# Patient Record
Sex: Female | Born: 1960 | Race: Black or African American | Hispanic: No | Marital: Single | Smoking: Current every day smoker
Health system: Southern US, Community
[De-identification: ages and names within clinical notes are randomized; demographics above are authoritative.]

## PROBLEM LIST (undated history)

## (undated) DIAGNOSIS — I10 Essential (primary) hypertension: Secondary | ICD-10-CM

## (undated) DIAGNOSIS — R351 Nocturia: Secondary | ICD-10-CM

## (undated) DIAGNOSIS — G5600 Carpal tunnel syndrome, unspecified upper limb: Secondary | ICD-10-CM

## (undated) DIAGNOSIS — Z8669 Personal history of other diseases of the nervous system and sense organs: Secondary | ICD-10-CM

## (undated) DIAGNOSIS — M549 Dorsalgia, unspecified: Secondary | ICD-10-CM

## (undated) DIAGNOSIS — G8929 Other chronic pain: Secondary | ICD-10-CM

## (undated) DIAGNOSIS — R531 Weakness: Secondary | ICD-10-CM

## (undated) DIAGNOSIS — K5900 Constipation, unspecified: Secondary | ICD-10-CM

## (undated) DIAGNOSIS — K8020 Calculus of gallbladder without cholecystitis without obstruction: Secondary | ICD-10-CM

## (undated) HISTORY — PX: BACK SURGERY: SHX140

## (undated) HISTORY — PX: TUBAL LIGATION: SHX77

## (undated) HISTORY — PX: TONSILLECTOMY AND ADENOIDECTOMY: SUR1326

## (undated) HISTORY — PX: CERVICAL SPINE SURGERY: SHX589

## (undated) HISTORY — PX: MYRINGOTOMY: SUR874

## (undated) HISTORY — PX: COLONOSCOPY: SHX174

## (undated) HISTORY — PX: OTHER SURGICAL HISTORY: SHX169

---

## 1998-11-22 ENCOUNTER — Ambulatory Visit (HOSPITAL_COMMUNITY): Admission: RE | Admit: 1998-11-22 | Discharge: 1998-11-22 | Payer: Self-pay | Admitting: Neurosurgery

## 1998-11-23 ENCOUNTER — Encounter: Payer: Self-pay | Admitting: Neurosurgery

## 1998-11-23 ENCOUNTER — Ambulatory Visit (HOSPITAL_COMMUNITY): Admission: RE | Admit: 1998-11-23 | Discharge: 1998-11-23 | Payer: Self-pay | Admitting: Neurosurgery

## 1998-11-30 ENCOUNTER — Encounter: Payer: Self-pay | Admitting: Neurosurgery

## 1998-12-01 ENCOUNTER — Encounter: Payer: Self-pay | Admitting: Neurosurgery

## 1998-12-01 ENCOUNTER — Inpatient Hospital Stay (HOSPITAL_COMMUNITY): Admission: RE | Admit: 1998-12-01 | Discharge: 1998-12-02 | Payer: Self-pay | Admitting: Neurosurgery

## 1999-05-14 ENCOUNTER — Emergency Department (HOSPITAL_COMMUNITY): Admission: EM | Admit: 1999-05-14 | Discharge: 1999-05-15 | Payer: Self-pay | Admitting: Emergency Medicine

## 1999-05-25 ENCOUNTER — Other Ambulatory Visit: Admission: RE | Admit: 1999-05-25 | Discharge: 1999-05-25 | Payer: Self-pay | Admitting: Obstetrics & Gynecology

## 1999-05-25 ENCOUNTER — Encounter: Admission: RE | Admit: 1999-05-25 | Discharge: 1999-05-25 | Payer: Self-pay | Admitting: Obstetrics & Gynecology

## 1999-06-08 ENCOUNTER — Encounter: Admission: RE | Admit: 1999-06-08 | Discharge: 1999-06-08 | Payer: Self-pay | Admitting: Obstetrics & Gynecology

## 2000-03-28 ENCOUNTER — Ambulatory Visit (HOSPITAL_COMMUNITY): Admission: RE | Admit: 2000-03-28 | Discharge: 2000-03-28 | Payer: Self-pay | Admitting: Neurosurgery

## 2000-03-28 ENCOUNTER — Encounter: Payer: Self-pay | Admitting: Neurosurgery

## 2000-10-19 ENCOUNTER — Inpatient Hospital Stay (HOSPITAL_COMMUNITY): Admission: AD | Admit: 2000-10-19 | Discharge: 2000-10-21 | Payer: Self-pay | Admitting: Obstetrics and Gynecology

## 2000-10-19 ENCOUNTER — Encounter: Payer: Self-pay | Admitting: Emergency Medicine

## 2000-10-28 ENCOUNTER — Encounter: Admission: RE | Admit: 2000-10-28 | Discharge: 2000-10-28 | Payer: Self-pay | Admitting: Obstetrics & Gynecology

## 2000-12-30 ENCOUNTER — Ambulatory Visit (HOSPITAL_COMMUNITY): Admission: RE | Admit: 2000-12-30 | Discharge: 2000-12-30 | Payer: Self-pay | Admitting: Neurosurgery

## 2000-12-30 ENCOUNTER — Encounter: Payer: Self-pay | Admitting: Neurosurgery

## 2001-01-16 ENCOUNTER — Encounter: Payer: Self-pay | Admitting: Neurosurgery

## 2001-01-20 ENCOUNTER — Encounter: Payer: Self-pay | Admitting: Neurosurgery

## 2001-01-20 ENCOUNTER — Inpatient Hospital Stay (HOSPITAL_COMMUNITY): Admission: RE | Admit: 2001-01-20 | Discharge: 2001-01-21 | Payer: Self-pay | Admitting: Neurosurgery

## 2004-07-10 ENCOUNTER — Emergency Department (HOSPITAL_COMMUNITY): Admission: EM | Admit: 2004-07-10 | Discharge: 2004-07-10 | Payer: Self-pay | Admitting: Emergency Medicine

## 2004-07-20 ENCOUNTER — Encounter (INDEPENDENT_AMBULATORY_CARE_PROVIDER_SITE_OTHER): Payer: Self-pay | Admitting: *Deleted

## 2004-07-20 ENCOUNTER — Other Ambulatory Visit: Admission: RE | Admit: 2004-07-20 | Discharge: 2004-07-20 | Payer: Self-pay | Admitting: Obstetrics and Gynecology

## 2004-07-20 ENCOUNTER — Encounter: Admission: RE | Admit: 2004-07-20 | Discharge: 2004-07-20 | Payer: Self-pay | Admitting: Family Medicine

## 2004-07-26 ENCOUNTER — Emergency Department (HOSPITAL_COMMUNITY): Admission: EM | Admit: 2004-07-26 | Discharge: 2004-07-26 | Payer: Self-pay | Admitting: Family Medicine

## 2004-08-08 ENCOUNTER — Emergency Department (HOSPITAL_COMMUNITY): Admission: EM | Admit: 2004-08-08 | Discharge: 2004-08-08 | Payer: Self-pay | Admitting: Family Medicine

## 2004-08-14 ENCOUNTER — Ambulatory Visit: Payer: Self-pay | Admitting: Obstetrics and Gynecology

## 2004-08-29 ENCOUNTER — Ambulatory Visit (HOSPITAL_COMMUNITY): Admission: RE | Admit: 2004-08-29 | Discharge: 2004-08-29 | Payer: Self-pay | Admitting: Obstetrics and Gynecology

## 2004-08-29 ENCOUNTER — Ambulatory Visit: Payer: Self-pay | Admitting: Obstetrics and Gynecology

## 2004-09-20 ENCOUNTER — Ambulatory Visit: Payer: Self-pay | Admitting: Obstetrics and Gynecology

## 2005-03-16 ENCOUNTER — Emergency Department (HOSPITAL_COMMUNITY): Admission: EM | Admit: 2005-03-16 | Discharge: 2005-03-16 | Payer: Self-pay | Admitting: Emergency Medicine

## 2006-08-20 ENCOUNTER — Encounter: Admission: RE | Admit: 2006-08-20 | Discharge: 2006-08-20 | Payer: Self-pay | Admitting: Neurosurgery

## 2006-12-03 ENCOUNTER — Emergency Department (HOSPITAL_COMMUNITY): Admission: EM | Admit: 2006-12-03 | Discharge: 2006-12-03 | Payer: Self-pay | Admitting: Emergency Medicine

## 2008-05-12 ENCOUNTER — Encounter: Admission: RE | Admit: 2008-05-12 | Discharge: 2008-05-12 | Payer: Self-pay | Admitting: Neurosurgery

## 2008-07-13 ENCOUNTER — Encounter: Admission: RE | Admit: 2008-07-13 | Discharge: 2008-09-02 | Payer: Self-pay | Admitting: Neurosurgery

## 2009-05-30 ENCOUNTER — Emergency Department (HOSPITAL_COMMUNITY): Admission: EM | Admit: 2009-05-30 | Discharge: 2009-05-30 | Payer: Self-pay | Admitting: Family Medicine

## 2009-08-10 ENCOUNTER — Emergency Department (HOSPITAL_COMMUNITY): Admission: EM | Admit: 2009-08-10 | Discharge: 2009-08-10 | Payer: Self-pay | Admitting: Emergency Medicine

## 2009-11-10 ENCOUNTER — Inpatient Hospital Stay (HOSPITAL_COMMUNITY): Admission: RE | Admit: 2009-11-10 | Discharge: 2009-11-12 | Payer: Self-pay | Admitting: Neurosurgery

## 2010-09-06 ENCOUNTER — Encounter: Admission: RE | Admit: 2010-09-06 | Discharge: 2010-09-06 | Payer: Self-pay | Admitting: Neurosurgery

## 2011-02-25 LAB — PROTIME-INR: INR: 0.99 (ref 0.00–1.49)

## 2011-02-26 LAB — DIFFERENTIAL
Basophils Absolute: 0 10*3/uL (ref 0.0–0.1)
Basophils Relative: 0 % (ref 0–1)
Eosinophils Absolute: 0.1 10*3/uL (ref 0.0–0.7)
Eosinophils Relative: 2 % (ref 0–5)
Monocytes Absolute: 0.5 10*3/uL (ref 0.1–1.0)
Monocytes Relative: 8 % (ref 3–12)

## 2011-02-26 LAB — URINALYSIS, ROUTINE W REFLEX MICROSCOPIC
Bilirubin Urine: NEGATIVE
Glucose, UA: NEGATIVE mg/dL
Ketones, ur: NEGATIVE mg/dL
Nitrite: NEGATIVE
Specific Gravity, Urine: 1.028 (ref 1.005–1.030)
pH: 5 (ref 5.0–8.0)

## 2011-02-26 LAB — COMPREHENSIVE METABOLIC PANEL
AST: 19 U/L (ref 0–37)
Albumin: 3.8 g/dL (ref 3.5–5.2)
Alkaline Phosphatase: 58 U/L (ref 39–117)
CO2: 30 mEq/L (ref 19–32)
Chloride: 105 mEq/L (ref 96–112)
Creatinine, Ser: 1.01 mg/dL (ref 0.4–1.2)
GFR calc non Af Amer: 58 mL/min — ABNORMAL LOW (ref >60)
Potassium: 4 mEq/L (ref 3.5–5.1)
Total Bilirubin: 0.5 mg/dL (ref 0.3–1.2)

## 2011-02-26 LAB — CBC
HCT: 42.2 % (ref 36.0–46.0)
Hemoglobin: 14.1 g/dL (ref 12.0–15.0)
Platelets: 219 10*3/uL (ref 150–400)
RDW: 14.9 % (ref 11.5–15.5)
WBC: 5.5 10*3/uL (ref 4.0–10.5)

## 2011-02-26 LAB — URINE MICROSCOPIC-ADD ON

## 2011-02-26 LAB — TYPE AND SCREEN: Antibody Screen: NEGATIVE

## 2011-02-26 LAB — ABO/RH: ABO/RH(D): A POS

## 2011-04-12 NOTE — Op Note (Signed)
[place]  Patient:    Deborah Mcmahon, Deborah Mcmahon                      MRN: [id number] Proc. Date: 01/20/01 Adm. Date:  [id number] Attending:  Emeterio Reeve                           Operative Report  PREOPERATIVE DIAGNOSIS:  Herniated disk C6-7.  POSTOPERATIVE DIAGNOSIS:  Herniated disk C6-7.  OPERATION PERFORMED:  Anterior cervical diskectomy and fusion C6-7.  SURGEON:  Payton Doughty, M.D.  ANESTHESIA:  General endotracheal.  PREP:  Sterile Betadine prep and scrub with alcohol wipe.  COMPLICATIONS:  None.  ASSISTANT:  Hewitt Shorts, M.D.  INDICATIONS FOR PROCEDURE:  The patient is a 50 year old right-handed, black female with a herniated disk at C6-C7.  DESCRIPTION OF PROCEDURE:  The patient was taken to the operating room, smoothly anesthetized and intubated, and placed supine on the operating table. Following shave, prep and drape in the usual sterile fashion, the skin was incised from the midline to the medial border of the sternocleidomastoid muscle at a fingerbreadth below the carotid tubercle.  The platysma was identified, elevated and divided and undermined.  The sternocleidomastoid was identified.  Medial dissection revealed a carotid artery which retracted laterally to the left, trachea and esophagus were retracted laterally to the right, exposing the bones of the anterior cervical spine.  Intraoperative x-ray was obtained with a marker in place to confirm correctness of level. Having confirmed correctness of the level, diskectomy was carried out at C6 and C7 first under gross observation, then using the operating microscope. Under the operating microscope it was observed that there was a herniated disk that had gone through the posterior longitudinal ligament and was larger to the left side.  This was removed without difficulty.  At C7 both neural foramina were explored.  The C7, the one on the left had some disk material within  it that was removed with a hook.  On the right side, the neural foramen was relatively clear.  There had been, however, significant compression on the cord posteriorly.  Following complete diskectomy, a 7 mm bone graft was fashioned from patellar allograft and tapped into place.  The wound was irrigated and hemostasis assured and a 14 mm tether plate was placed with 12 mm screws, two in C6 and two in C7.  Intraoperative x-ray showed good placement of bone graft, plate and screws.  The platysma was reapproximated with 3-0 Vicryl in interrupted fashion.  Subcutaneous tissues reapproximated with 3-0 Vicryl in interrupted fashion.  Skin was closed with 4-0 Vicryl in running subcuticular fashion.  Benzoin and Steri-Strips were placed and made occlusive with Telfa and Op-Site.  The patient was then placed in an Aspen collar and returned to the recovery room in good condition. DD:  01/20/01 TD:  01/20/01 Job: [id number]

## 2011-04-12 NOTE — Discharge Summary (Signed)
[facility]  Patient:    Deborah Mcmahon, Deborah Mcmahon                      MRN: [id number] Adm. Date:  [id number] Disc. Date: [id number] Attending:  Antionette Char Dictator:   Maryelizabeth Rowan, M.D.                           Discharge Summary  DATE OF BIRTH:                07/07/2061  PRIMARY DIAGNOSIS:            Pelvic inflammatory disease.  SECONDARY DIAGNOSES:          1. Uterine fibroids.                               2. Anemia.  HISTORY OF PRESENT ILLNESS:   This 50 year old, African-American, female patient of Roseanna Rainbow, M.D., presented to the [facility] complaining of abdominal pain.  The patient was diagnosed with gonorrhea approximately six months ago and treated at that time.  HOSPITAL COURSE:              The patient was admitted and treated with IV gentamicin and clindamycin.  The patient received antibiotics until she was pain-free.  The patient became pain-free the evening before discharge.  SIGNIFICANT LABORATORIES:     Drug screen positive for cocaine.  HIV nonreactive.  Blood cultures negative.  Hepatitis B surface antigen negative. The iron level was less than 10, ferritin 37, and TIBC could not be calculated because of significant decrease in iron.  CONSULTANTS:                  None.  PROCEDURES:                   None.  DISCHARGE CONDITION:          The patient was discharged home in stable condition with follow-up with Roseanna Rainbow, M.D., on Tuesday, October 28, 2000, at 2:15 p.m.  MEDICATIONS AT DISCHARGE:     1. Doxycycline 100 mg b.i.d.                               2. Iron 325 mg three times a day. DD:  10/21/00 TD:  10/21/00 Job: [id number]

## 2011-04-12 NOTE — Group Therapy Note (Signed)
NAME:  Deborah Mcmahon, Deborah Mcmahon                         ACCOUNT NO.:  [id number]   MEDICAL RECORD NO.:  [id number]                   PATIENT TYPE:  OUT   LOCATION:  [facility]                           FACILITY:  [facility]   PHYSICIAN:  Argentina Donovan, MD                     DATE OF BIRTH:  06/28/1961   DATE OF SERVICE:  07/20/2004                                    CLINIC NOTE   REASON FOR VISIT:  The patient is a 50 year old nulligravida black female  who has had a long history of menometrorrhagia which has gotten worse in the  last 6 months.  She is a smoker so cannot use oral contraceptives, and an  ultrasound done 2 years ago showed a small leiomyomata on the outside of the  uterus but a normal endometrial cavity.  The patient had a history of  cocaine many years ago but she has not been taking any of that for the last  3 years and is otherwise in fairly good health.  She has some eczema on her  lower extremities, but other than that no health problems and is not taking  any iron supplement.   PHYSICAL EXAMINATION:  The external genitalia is normal, BUS within normal  limits.  The vagina is clean and well rugated.  The cervix is clean,  nulliparous, and the uterus is anterior, normal size, shape, consistency,  with normal adnexa.  Endometrial biopsy and Pap smear were taken.   Discussed possible options for this patient and she seems to lean towards  the NovaSure endometrial ablation.  We have told her that she seems to be a  good candidate with a normal-size uterus and that the periods may be lighter  or nonexistent afterwards.  However, there is probably a 25% chance that  there will be no change in her flow.  We have talked to her about the  possible complications of perforation and infection and anesthetic  complications.  The patient is being placed on iron therapy, a CBC is being  drawn, and I am going to renew her topical cortisone cream for her eczema.   IMPRESSION:  1. Intractable  menometrorrhagia.  2. Small leiomyomata uteri.                                               Argentina Donovan, MD    PR/MEDQ  D:  07/20/2004  T:  07/21/2004  Job:  [id number]

## 2011-04-12 NOTE — Op Note (Signed)
Deborah Mcmahon, Deborah Mcmahon               ACCOUNT NO.:  [id number]   MEDICAL RECORD NO.:  [id number]          PATIENT TYPE:  AMB   LOCATION:  [facility]                           FACILITY:  [facility]   PHYSICIAN:  Phil D. Okey Dupre, M.D.     DATE OF BIRTH:  1961/07/21   DATE OF PROCEDURE:  08/29/2004  DATE OF DISCHARGE:                                 OPERATIVE REPORT   PREOPERATIVE DIAGNOSIS:  Intractable menometrorrhagia with anemia.   POSTOPERATIVE DIAGNOSIS:  Intractable menometrorrhagia with anemia.   PROCEDURE:  Endometrial ablation with the Novasure method.   SURGEON:  Javier Glazier. Okey Dupre, M.D.   ANESTHESIA:  General.   ESTIMATED BLOOD LOSS:  30 mL.   The procedure went as follows:  Under satisfactory general anesthesia,  patient in dorsal lithotomy position, the perineum and vagina were prepped  and draped in the usual sterile manner.  Bimanual pelvic examination under  anesthesia revealed a uterus upper limits of normal size, regular in  configuration, and freely movable.  A weighted speculum was placed in the  posterior fourchette of the vagina, the anterior lip of the cervix grasped  with a single-tooth tenaculum.  The uterine cavity sounded to a depth of 10  cm.  The endocervical os dilated to a #8 Hegar dilator.  The endocervical  measurement was 4 cm.  The Novasure applicator was adjusted appropriately to  an endometrial cavity of 6 cm.  It inserted and manipulated in the usual  fashion to good seating and a width of 4.5.  Appropriate pressure was  applied, and the ablation took 1 minute 10 seconds.  The instrument was then  removed in the usual fashion.  A ring forceps was placed on the anterior lip  of the cervix, where there was a small bleeder, bleeding caused by the  tenaculum, which controlled the bleeding quite easily, and the patient was  transferred to the recovery room in satisfactory condition, having tolerated  the procedure well.      PDR/MEDQ  D:  08/29/2004  T:  08/29/2004   Job:  [id number]

## 2011-04-12 NOTE — H&P (Signed)
[facility]  Patient:    Deborah Mcmahon, Deborah Mcmahon                        MRN: [id number] Adm. Date:  01/20/01 Attending:  Payton Doughty, M.D.                         History and Physical  ADMISSION DIAGNOSES:  Herniated disks at C6-7.  HISTORY OF PRESENT ILLNESS:  This is a 50 year old right handed black female who has had a 3-4 disk in 1994, 5-6 disk done in 1996, and a 4-5 disk done in 2000.  She now presents with increasing neck pain and MRI that shows large herniated disk at C6-7 with cord compression.  PAST MEDICAL HISTORY:  Remarkable for a tubal cyst removed as an 50 year old. No other medical problems.  MEDICATIONS:  None.  ALLERGIES:  No known drug allergies.  SOCIAL HISTORY:  She smokes 1/2 pack of cigarettes a day.  She does not drink alcohol.  She is currently off work.  FAMILY HISTORY:  Unknown and as far as we know noncontributory.  REVIEW OF SYSTEMS:  Remarkable for neck pain and shoulder pain.  PHYSICAL EXAMINATION:  HEENT:  Within normal limits.  She has good range of motion of her neck.  CHEST:  Clear.  HEART:  Unremarkable.  ABDOMEN:  Nontender with no hepatosplenomegaly.  EXTREMITIES:  Without clubbing or cyanosis.  GENITOURINARY:  Deferred.  Peripheral pulses are good.  NEUROLOGICAL:  She is awake, alert, and oriented.  Cranial nerves are intact. Motor examination shows 5/5 strength in the upper extremities save for some pain limited weakness in triceps on the right side.  There is no current sensory deficit.  Reflexes are intact.  She does have a positive Hoffmans. Lower extremity reflexes are nonmyopathic.  She does have a positive Lhermittes with neck extension.  MRI shows large herniated disk at C6-7 with cord compression.  PLAN:  Anterior cervical diskectomy and fusion.  The risks and benefits of this approach have been discussed with her and she wishes to proceed. DD:  01/20/01 TD:  01/20/01 Job:  [id number]

## 2012-02-07 ENCOUNTER — Ambulatory Visit: Payer: Self-pay | Admitting: Advanced Practice Midwife

## 2012-04-13 ENCOUNTER — Encounter (HOSPITAL_COMMUNITY): Payer: Self-pay | Admitting: Family Medicine

## 2012-04-13 ENCOUNTER — Emergency Department (HOSPITAL_COMMUNITY): Payer: Medicare Other

## 2012-04-13 ENCOUNTER — Emergency Department (HOSPITAL_COMMUNITY)
Admission: EM | Admit: 2012-04-13 | Discharge: 2012-04-13 | Disposition: A | Discharge status: Home / Self Care | Payer: Medicare Other | Attending: Emergency Medicine | Admitting: Emergency Medicine

## 2012-04-13 DIAGNOSIS — R1011 Right upper quadrant pain: Secondary | ICD-10-CM | POA: Insufficient documentation

## 2012-04-13 DIAGNOSIS — R61 Generalized hyperhidrosis: Secondary | ICD-10-CM | POA: Insufficient documentation

## 2012-04-13 DIAGNOSIS — R0602 Shortness of breath: Secondary | ICD-10-CM | POA: Insufficient documentation

## 2012-04-13 DIAGNOSIS — K8020 Calculus of gallbladder without cholecystitis without obstruction: Secondary | ICD-10-CM | POA: Insufficient documentation

## 2012-04-13 DIAGNOSIS — R112 Nausea with vomiting, unspecified: Secondary | ICD-10-CM | POA: Insufficient documentation

## 2012-04-13 DIAGNOSIS — K8050 Calculus of bile duct without cholangitis or cholecystitis without obstruction: Secondary | ICD-10-CM

## 2012-04-13 LAB — CBC
HCT: 41.8 % (ref 36.0–46.0)
MCHC: 34.7 g/dL (ref 30.0–36.0)
Platelets: 241 10*3/uL (ref 150–400)
RDW: 14.5 % (ref 11.5–15.5)

## 2012-04-13 LAB — COMPREHENSIVE METABOLIC PANEL
AST: 24 U/L (ref 0–37)
Albumin: 4.3 g/dL (ref 3.5–5.2)
Alkaline Phosphatase: 63 U/L (ref 39–117)
BUN: 18 mg/dL (ref 6–23)
Potassium: 3.9 mEq/L (ref 3.5–5.1)
Total Protein: 8.2 g/dL (ref 6.0–8.3)

## 2012-04-13 LAB — LIPASE, BLOOD: Lipase: 25 U/L (ref 11–59)

## 2012-04-13 LAB — TROPONIN I: Troponin I: <0.3 ng/mL (ref <0.30)

## 2012-04-13 MED ORDER — ONDANSETRON HCL 4 MG/2ML IJ SOLN
INTRAMUSCULAR | Status: AC
  Filled 2012-04-13: qty 2

## 2012-04-13 MED ORDER — MORPHINE SULFATE 4 MG/ML IJ SOLN
4.0000 mg | Freq: Once | INTRAMUSCULAR | Status: AC
  Administered 2012-04-13: 4 mg via INTRAVENOUS
  Filled 2012-04-13: qty 1

## 2012-04-13 MED ORDER — ONDANSETRON HCL 4 MG/2ML IJ SOLN
4.0000 mg | Freq: Once | INTRAMUSCULAR | Status: AC
  Administered 2012-04-13: 4 mg via INTRAVENOUS

## 2012-04-13 MED ORDER — ONDANSETRON 8 MG PO TBDP: 8.0000 mg | ORAL_TABLET | Freq: Three times a day (TID) | ORAL | Status: AC | PRN

## 2012-04-13 MED ORDER — SODIUM CHLORIDE 0.9 % IV SOLN
Freq: Once | INTRAVENOUS | Status: AC
  Administered 2012-04-13: 14:00:00 via INTRAVENOUS

## 2012-04-13 MED ORDER — HYDROCODONE-ACETAMINOPHEN 5-325 MG PO TABS: 1.0000 | ORAL_TABLET | ORAL | Status: AC | PRN

## 2012-04-13 NOTE — ED Notes (Signed)
US at bedside

## 2012-04-13 NOTE — ED Provider Notes (Signed)
History     CSN: [id number]  Arrival date & time 04/13/12  1356   First MD Initiated Contact with Patient 04/13/12 1407      Chief Complaint  Patient presents with  . Shortness of Breath  . Abdominal Pain  . Nausea  . Dizziness    (Consider location/radiation/quality/duration/timing/severity/associated sxs/prior treatment) The history is provided by the patient.   patient reports she felt normal earlier today and did some exercise.  She went home and ate lunch and then took a nap.  When she woke from her nap she was having epigastric and right upper quadrant discomfort.  She then reports she became extremely nauseated and she vomited several times.  Her vomit was described as nonbloody and non-bilious.  Denies coffee ground emesis.  She's had no diarrhea.  She denies melena or hematochezia.  She denies chest pain.  She does report with associated pain being severe she feels short of breath and sweaty.  She denies jaw pain shoulder pain and neck pain.  She has no prior history of coronary artery disease.  She reports she feels much better after vomiting at home.  Respiratory rate was initially very fast on arrival but after pain control she feels is her breathing is much better and is now controlled.  History reviewed. No pertinent past medical history.  History reviewed. No pertinent past surgical history.  History reviewed. No pertinent family history.  History  Substance Use Topics  . Smoking status: Not on file  . Smokeless tobacco: Not on file  . Alcohol Use: Not on file    OB History    Grav Para Term Preterm Abortions TAB SAB Ect Mult Living                  Review of Systems  Respiratory: Positive for shortness of breath.   Gastrointestinal: Positive for abdominal pain.  All other systems reviewed and are negative.    Allergies  Review of patient's allergies indicates no known allergies.  Home Medications   Current Outpatient Rx  Name Route Sig Dispense  Refill  . IBUPROFEN 200 MG PO TABS Oral Take 600 mg by mouth every 8 (eight) hours as needed. For pain.      BP 162/85  Pulse 64  Temp(Src) 98.7 F (37.1 C) (Oral)  Resp 18  SpO2 100%  Physical Exam  Nursing note and vitals reviewed. Constitutional: She is oriented to person, place, and time. She appears well-developed and well-nourished. No distress.  HENT:  Head: Normocephalic and atraumatic.  Eyes: EOM are normal.  Neck: Normal range of motion.  Cardiovascular: Normal rate, regular rhythm and normal heart sounds.   Pulmonary/Chest: Effort normal and breath sounds normal.  Abdominal: Soft. She exhibits no distension.       Mild right upper quadrant abdominal tenderness without guarding or rebound  Musculoskeletal: Normal range of motion.  Neurological: She is alert and oriented to person, place, and time.  Skin: Skin is warm and dry.  Psychiatric: She has a normal mood and affect. Judgment normal.    ED Course  Procedures (including critical care time)  Labs Reviewed  COMPREHENSIVE METABOLIC PANEL - Abnormal; Notable for the following:    Glucose, Bld 127 (*)    Creatinine, Ser 1.15 (*)    GFR calc non Af Amer 54 (*)    GFR calc Af Amer 62 (*)    All other components within normal limits  GLUCOSE, CAPILLARY - Abnormal; Notable for the following:  Glucose-Capillary 115 (*)    All other components within normal limits  CBC  LIPASE, BLOOD  TROPONIN I   US Abdomen Complete  04/13/2012  *RADIOLOGY REPORT*  Clinical Data:  Shortness of breath, abdominal pain  COMPLETE ABDOMINAL ULTRASOUND  Comparison:  None.  Findings:  Gallbladder:  Gallstones are noted within gallbladder.  The largest gallstone measures 1.3 cm.  There is a thickened edematous gallbladder wall without sonographic Murphy's sign.  Wall thickness measures 5 mm.  Clinical correlation is necessary to exclude early cholecystitis.  Common bile duct:  Measures 4.5 mm in diameter within normal limits.  Liver:  No  focal lesion identified.  Within normal limits in parenchymal echogenicity.  IVC:  Appears normal.  Pancreas:  No focal abnormality seen.  Spleen:  Measures 7.4 cm in length.  Normal echogenicity.  Right Kidney:  Measures 11.9 cm in length.  No mass, hydronephrosis or diagnostic renal calculus  Left Kidney:  Measures 11.4 cm in length.  No mass, hydronephrosis or diagnostic renal calculus  Abdominal aorta:  No aneurysm identified. There is up to 2.4 cm in diameter.  IMPRESSION: 1.  Gallstones are noted within gallbladder.  The largest gallstone measures 1.3 cm.  There is a thickened edematous gallbladder wall without sonographic Murphy's sign.  Wall thickness measures 5 mm. Clinical correlation is necessary to exclude early cholecystitis. 2.  Normal CBD. 3.  No hydronephrosis or diagnostic renal calculus.  Original Report Authenticated By: Natasha Mead, M.D.   Dg Chest Portable 1 View  04/13/2012  *RADIOLOGY REPORT*  Clinical Data: Shortness of breath.  PORTABLE CHEST - 1 VIEW  Comparison: 11/06/2009.  Findings: The cardiac silhouette, mediastinal and hilar contours are stable.  The lungs are clear.  No pleural effusion.  The bony thorax is intact.  IMPRESSION: No acute cardiopulmonary findings.  Original Report Authenticated By: P. Loralie Champagne, M.D.     1. Biliary colic       MDM  The patient has evidence of cholelithiasis.  At this time she no longer has right upper quadrant abdominal pain.  I don't believe this to be cholecystitis.  Discharge home in good condition.  She's been given general surgery number for followup for possible outpatient cholecystectomy.  Home with pain medicine and nausea medicine when necessary.  The patient understands to return to the ER for worsening abdominal pain or high fevers as this would suggest development of acute cholecystitis.  Clinically at this time she has not have it        Lyanne Co, MD 04/13/12 1710

## 2012-04-13 NOTE — ED Notes (Signed)
Pt reports having acute onset epigastric pain, sob, nausea, diaphoresis, and dizziness today about a "couple hours ago."

## 2012-04-13 NOTE — Discharge Instructions (Signed)
Biliary Colic  Biliary colic is a steady or irregular pain in the upper abdomen. It is usually under the right side of the rib cage. It happens when gallstones interfere with the normal flow of bile from the gallbladder. Bile is a liquid that helps to digest fats. Bile is made in the liver and stored in the gallbladder. When you eat a meal, bile passes from the gallbladder through the cystic duct and the common bile duct into the small intestine. There, it mixes with partially digested food. If a gallstone blocks either of these ducts, the normal flow of bile is blocked. The muscle cells in the bile duct contract forcefully to try to move the stone. This causes the pain of biliary colic.  SYMPTOMS   A person with biliary colic usually complains of pain in the upper abdomen. This pain can be:   In the center of the upper abdomen just below the breastbone.   In the upper-right part of the abdomen, near the gallbladder and liver.   Spread back toward the right shoulder blade.   Nausea and vomiting.   The pain usually occurs after eating.   Biliary colic is usually triggered by the digestive system's demand for bile. The demand for bile is high after fatty meals. Symptoms can also occur when a person who has been fasting suddenly eats a very large meal. Most episodes of biliary colic pass after 1 to 5 hours. After the most intense pain passes, your abdomen may continue to ache mildly for about 24 hours.  DIAGNOSIS  After you describe your symptoms, your caregiver will perform a physical exam. He or she will pay attention to the upper right portion of your belly (abdomen). This is the area of your liver and gallbladder. An ultrasound will help your caregiver look for gallstones. Specialized scans of the gallbladder may also be done. Blood tests may be done, especially if you have fever or if your pain persists. PREVENTION  Biliary colic can be prevented by controlling the risk factors for gallstones.  Some of these risk factors, such as heredity, increasing age, and pregnancy are a normal part of life. Obesity and a high-fat diet are risk factors you can change through a healthy lifestyle. Women going through menopause who take hormone replacement therapy (estrogen) are also more likely to develop biliary colic. TREATMENT   Pain medication may be prescribed.   You may be encouraged to eat a fat-free diet.   If the first episode of biliary colic is severe, or episodes of colic keep retuning, surgery to remove the gallbladder (cholecystectomy) is usually recommended. This procedure can be done through small incisions using an instrument called a laparoscope. The procedure often requires a brief stay in the hospital. Some people can leave the hospital the same day. It is the most widely used treatment in people troubled by painful gallstones. It is effective and safe, with no complications in more than 90% of cases.   If surgery cannot be done, medication that dissolves gallstones may be used. This medication is expensive and can take months or years to work. Only small stones will dissolve.   Rarely, medication to dissolve gallstones is combined with a procedure called shock-wave lithotripsy. This procedure uses carefully aimed shock waves to break up gallstones. In many people treated with this procedure, gallstones form again within a few years.  PROGNOSIS  If gallstones block your cystic duct or common bile duct, you are at risk for repeated episodes of   duct, you are at risk for repeated episodes of biliary colic. There is also a 25% chance that you will develop a gallbladder infection(acute cholecystitis), or some other complication of gallstones within 10 to 20 years. If you have surgery, schedule it at a time that is convenient for you and at a time when you are not sick.  HOME CARE INSTRUCTIONS    Drink plenty of clear fluids.   Avoid fatty, greasy or fried foods, or any foods that make your pain worse.   Take medications as directed.  SEEK MEDICAL  CARE IF:    You develop a fever over 100.5 F (38.1 C).   Your pain gets worse over time.   You develop nausea that prevents you from eating and drinking.   You develop vomiting.  SEEK IMMEDIATE MEDICAL CARE IF:    You have continuous or severe belly (abdominal) pain which is not relieved with medications.   You develop nausea and vomiting which is not relieved with medications.   You have symptoms of biliary colic and you suddenly develop a fever and shaking chills. This may signal cholecystitis. Call your caregiver immediately.   You develop a yellow color to your skin or the white part of your eyes (jaundice).  Document Released: 04/14/2006 Document Revised: 10/31/2011 Document Reviewed: 06/23/2008  [employer] Patient Information 2012 [employer]

## 2012-07-17 ENCOUNTER — Other Ambulatory Visit: Payer: Self-pay | Admitting: Neurosurgery

## 2012-07-17 DIAGNOSIS — M47812 Spondylosis without myelopathy or radiculopathy, cervical region: Secondary | ICD-10-CM

## 2012-07-21 ENCOUNTER — Ambulatory Visit
Admission: RE | Admit: 2012-07-21 | Discharge: 2012-07-21 | Disposition: A | Discharge status: Home / Self Care | Payer: Medicare Other | Source: Ambulatory Visit | Attending: Neurosurgery | Admitting: Neurosurgery

## 2012-07-21 DIAGNOSIS — M47812 Spondylosis without myelopathy or radiculopathy, cervical region: Secondary | ICD-10-CM

## 2012-08-11 ENCOUNTER — Other Ambulatory Visit: Payer: Self-pay | Admitting: Neurosurgery

## 2012-08-11 DIAGNOSIS — M47812 Spondylosis without myelopathy or radiculopathy, cervical region: Secondary | ICD-10-CM

## 2012-08-13 ENCOUNTER — Ambulatory Visit
Admission: RE | Admit: 2012-08-13 | Discharge: 2012-08-13 | Disposition: A | Discharge status: Home / Self Care | Payer: Medicare Other | Source: Ambulatory Visit | Attending: Neurosurgery | Admitting: Neurosurgery

## 2012-08-13 VITALS — BP 178/94 | HR 67

## 2012-08-13 DIAGNOSIS — M5126 Other intervertebral disc displacement, lumbar region: Secondary | ICD-10-CM

## 2012-08-13 DIAGNOSIS — M47812 Spondylosis without myelopathy or radiculopathy, cervical region: Secondary | ICD-10-CM

## 2012-08-13 DIAGNOSIS — M5125 Other intervertebral disc displacement, thoracolumbar region: Secondary | ICD-10-CM

## 2012-08-13 MED ORDER — IOHEXOL 300 MG/ML  SOLN
1.0000 mL | Freq: Once | INTRAMUSCULAR | Status: AC | PRN
  Administered 2012-08-13: 1 mL via EPIDURAL

## 2012-08-13 MED ORDER — TRIAMCINOLONE ACETONIDE 40 MG/ML IJ SUSP (RADIOLOGY)
60.0000 mg | Freq: Once | INTRAMUSCULAR | Status: AC
  Administered 2012-08-13: 60 mg via EPIDURAL

## 2012-09-29 ENCOUNTER — Other Ambulatory Visit: Payer: Self-pay | Admitting: Neurosurgery

## 2012-09-29 DIAGNOSIS — M47812 Spondylosis without myelopathy or radiculopathy, cervical region: Secondary | ICD-10-CM

## 2012-09-30 ENCOUNTER — Ambulatory Visit
Admission: RE | Admit: 2012-09-30 | Discharge: 2012-09-30 | Disposition: A | Discharge status: Home / Self Care | Payer: Medicare Other | Source: Ambulatory Visit | Attending: Neurosurgery | Admitting: Neurosurgery

## 2012-09-30 ENCOUNTER — Other Ambulatory Visit: Payer: Medicare Other

## 2012-09-30 VITALS — BP 169/92 | HR 74

## 2012-09-30 DIAGNOSIS — M47812 Spondylosis without myelopathy or radiculopathy, cervical region: Secondary | ICD-10-CM

## 2012-09-30 MED ORDER — IOHEXOL 300 MG/ML  SOLN
1.0000 mL | Freq: Once | INTRAMUSCULAR | Status: AC | PRN
  Administered 2012-09-30: 1 mL via EPIDURAL

## 2012-09-30 MED ORDER — TRIAMCINOLONE ACETONIDE 40 MG/ML IJ SUSP (RADIOLOGY)
60.0000 mg | Freq: Once | INTRAMUSCULAR | Status: AC
  Administered 2012-09-30: 60 mg via EPIDURAL

## 2013-05-12 ENCOUNTER — Other Ambulatory Visit: Payer: Self-pay | Admitting: Neurosurgery

## 2013-05-12 DIAGNOSIS — M47817 Spondylosis without myelopathy or radiculopathy, lumbosacral region: Secondary | ICD-10-CM

## 2013-05-14 ENCOUNTER — Ambulatory Visit
Admission: RE | Admit: 2013-05-14 | Discharge: 2013-05-14 | Disposition: A | Discharge status: Home / Self Care | Payer: Medicare Other | Source: Ambulatory Visit | Attending: Neurosurgery | Admitting: Neurosurgery

## 2013-05-14 DIAGNOSIS — M47817 Spondylosis without myelopathy or radiculopathy, lumbosacral region: Secondary | ICD-10-CM

## 2014-12-30 DIAGNOSIS — M4316 Spondylolisthesis, lumbar region: Secondary | ICD-10-CM | POA: Diagnosis not present

## 2014-12-30 DIAGNOSIS — Z6835 Body mass index (BMI) 35.0-35.9, adult: Secondary | ICD-10-CM | POA: Diagnosis not present

## 2014-12-30 DIAGNOSIS — S32009K Unspecified fracture of unspecified lumbar vertebra, subsequent encounter for fracture with nonunion: Secondary | ICD-10-CM | POA: Diagnosis not present

## 2014-12-30 DIAGNOSIS — M4806 Spinal stenosis, lumbar region: Secondary | ICD-10-CM | POA: Diagnosis not present

## 2014-12-30 DIAGNOSIS — M5416 Radiculopathy, lumbar region: Secondary | ICD-10-CM | POA: Diagnosis not present

## 2015-01-03 ENCOUNTER — Other Ambulatory Visit (HOSPITAL_COMMUNITY): Payer: Self-pay | Admitting: Neurosurgery

## 2015-01-17 ENCOUNTER — Other Ambulatory Visit: Payer: Self-pay

## 2015-01-17 ENCOUNTER — Encounter (HOSPITAL_COMMUNITY)
Admission: RE | Admit: 2015-01-17 | Discharge: 2015-01-17 | Disposition: A | Discharge status: Home / Self Care | Payer: Medicare Other | Source: Ambulatory Visit | Attending: Neurosurgery | Admitting: Neurosurgery

## 2015-01-17 ENCOUNTER — Encounter (HOSPITAL_COMMUNITY): Payer: Self-pay

## 2015-01-17 DIAGNOSIS — Z01818 Encounter for other preprocedural examination: Secondary | ICD-10-CM | POA: Insufficient documentation

## 2015-01-17 DIAGNOSIS — M4316 Spondylolisthesis, lumbar region: Secondary | ICD-10-CM | POA: Insufficient documentation

## 2015-01-17 DIAGNOSIS — Z87891 Personal history of nicotine dependence: Secondary | ICD-10-CM | POA: Insufficient documentation

## 2015-01-17 DIAGNOSIS — Z01812 Encounter for preprocedural laboratory examination: Secondary | ICD-10-CM | POA: Diagnosis not present

## 2015-01-17 DIAGNOSIS — R9431 Abnormal electrocardiogram [ECG] [EKG]: Secondary | ICD-10-CM | POA: Diagnosis not present

## 2015-01-17 DIAGNOSIS — Z79899 Other long term (current) drug therapy: Secondary | ICD-10-CM | POA: Diagnosis not present

## 2015-01-17 DIAGNOSIS — I10 Essential (primary) hypertension: Secondary | ICD-10-CM | POA: Insufficient documentation

## 2015-01-17 HISTORY — DX: Essential (primary) hypertension: I10

## 2015-01-17 HISTORY — DX: Calculus of gallbladder without cholecystitis without obstruction: K80.20

## 2015-01-17 HISTORY — DX: Nocturia: R35.1

## 2015-01-17 HISTORY — DX: Constipation, unspecified: K59.00

## 2015-01-17 HISTORY — DX: Personal history of other diseases of the nervous system and sense organs: Z86.69

## 2015-01-17 HISTORY — DX: Dorsalgia, unspecified: M54.9

## 2015-01-17 HISTORY — DX: Other chronic pain: G89.29

## 2015-01-17 HISTORY — DX: Weakness: R53.1

## 2015-01-17 HISTORY — DX: Carpal tunnel syndrome, unspecified upper limb: G56.00

## 2015-01-17 LAB — BASIC METABOLIC PANEL
ANION GAP: 12 (ref 5–15)
BUN: 20 mg/dL (ref 6–23)
CALCIUM: 9.8 mg/dL (ref 8.4–10.5)
CHLORIDE: 100 mmol/L (ref 96–112)
CO2: 26 mmol/L (ref 19–32)
CREATININE: 1.26 mg/dL — AB (ref 0.50–1.10)
GFR calc Af Amer: 55 mL/min — ABNORMAL LOW (ref >90)
GFR calc non Af Amer: 47 mL/min — ABNORMAL LOW (ref >90)
Glucose, Bld: 113 mg/dL — ABNORMAL HIGH (ref 70–99)
Potassium: 3.6 mmol/L (ref 3.5–5.1)
Sodium: 138 mmol/L (ref 135–145)

## 2015-01-17 LAB — CBC
HCT: 41.7 % (ref 36.0–46.0)
Hemoglobin: 14.2 g/dL (ref 12.0–15.0)
MCH: 29.7 pg (ref 26.0–34.0)
MCHC: 34.1 g/dL (ref 30.0–36.0)
MCV: 87.2 fL (ref 78.0–100.0)
Platelets: 215 10*3/uL (ref 150–400)
RBC: 4.78 MIL/uL (ref 3.87–5.11)
RDW: 14.9 % (ref 11.5–15.5)
WBC: 7 10*3/uL (ref 4.0–10.5)

## 2015-01-17 LAB — SURGICAL PCR SCREEN
MRSA, PCR: NEGATIVE
Staphylococcus aureus: NEGATIVE

## 2015-01-17 LAB — NO BLOOD PRODUCTS

## 2015-01-17 NOTE — Pre-Procedure Instructions (Signed)
Deborah PrimusBeverly R Mcmahon  01/17/2015   Your procedure is scheduled on:  Wed, Mar 2 @ 12:30 PM  [facility]  at 9:30 AM.  Call this number if you have problems the morning of surgery: [phone]   Remember:   Do not eat food or drink liquids after midnight.   Take these medicines the morning of surgery with A SIP OF WATER:               No Goody's,BC's,Aleve,Aspirin,Ibuprofen,Fish Oil,or any Herbal Medications.   Do not wear jewelry, make-up or nail polish.  Do not wear lotions, powders, or perfumes. You may wear deodorant.  Do not shave 48 hours prior to surgery.  Do not bring valuables to the hospital.  [facility] is not responsible                  for any belongings or valuables.               Contacts, dentures or bridgework may not be worn into surgery.  Leave suitcase in the car. After surgery it may be brought to your room.  For patients admitted to the hospital, discharge time is determined by your                treatment team.                   Special Instructions:  [facility] - Preparing for Surgery  Before surgery, you can play an important role.  Because skin is not sterile, your skin needs to be as free of germs as possible.  You can reduce the number of germs on you skin by washing with CHG (chlorahexidine gluconate) soap before surgery.  CHG is an antiseptic cleaner which kills germs and bonds with the skin to continue killing germs even after washing.  Please DO NOT use if you have an allergy to CHG or antibacterial soaps.  If your skin becomes reddened/irritated stop using the CHG and inform your nurse when you arrive at Short Stay.  Do not shave (including legs and underarms) for at least 48 hours prior to the first CHG shower.  You may shave your face.  Please follow these instructions carefully:   1.  Shower with CHG Soap the night before surgery and the                                morning of Surgery.  2.  If you choose to wash your hair,  wash your hair first as usual with your       normal shampoo.  3.  After you shampoo, rinse your hair and body thoroughly to remove the                      Shampoo.  4.  Use CHG as you would any other liquid soap.  You can apply chg directly       to the skin and wash gently with scrungie or a clean washcloth.  5.  Apply the CHG Soap to your body ONLY FROM THE NECK DOWN.        Do not use on open wounds or open sores.  Avoid contact with your eyes,       ears, mouth and genitals (private parts).  Wash genitals (private parts)       with your normal soap.  6.  Wash thoroughly, paying special attention to the area where your surgery        will be performed.  7.  Thoroughly rinse your body with warm water from the neck down.  8.  DO NOT shower/wash with your normal soap after using and rinsing off       the CHG Soap.  9.  Pat yourself dry with a clean towel.            10.  Wear clean pajamas.            11.  Place clean sheets on your bed the night of your first shower and do not        sleep with pets.  Day of Surgery  Do not apply any lotions/deoderants the morning of surgery.  Please wear clean clothes to the hospital/surgery center.     Please read over the following fact sheets that you were given: Pain Booklet, Coughing and Deep Breathing, Blood Transfusion Information, MRSA Information and Surgical Site Infection Prevention

## 2015-01-17 NOTE — Progress Notes (Addendum)
Sees PA Marva PandaKimberly Millsaps  Pt doesn't have a cardiologist  Denies ever having an echo/stress test/heart cath  Denies EKG or CXR in past yr

## 2015-01-18 ENCOUNTER — Encounter (HOSPITAL_COMMUNITY): Payer: Self-pay | Admitting: Vascular Surgery

## 2015-01-18 NOTE — Progress Notes (Signed)
Anesthesia Chart Review:  Patient is a 54 year old female scheduled for L4-5 PLIF on 01/25/15 by Dr. Lovell SheehanJenkins.  History includes smoking, HTN, migraines, C6-7 ACDF '02, L4-5 PLIF '10, gallstones.  BMI is consistent with obesity. PCP is Marva PandaKimberly Millsaps, NP.  Meds include Hyzaar, Percocet.  EKG 01/17/15: NSR, possible LAE, LAD, LVH, cannot rule out septal infarct (age undetermined).  Since last tracing on 04/13/12, rate is faster.  She also had findings of possible anteroseptal infarct on her 11/06/09 preoperative EKG.  Preoperative labs noted. Cr 1.26.  CBC WNL. Per documentation in her operative chart, she refused blood transfusion, but agreed to  only albumin or albumin containing products if needed--this was related to personal not religious beliefs. I left a voice message with Darl PikesSusan at Dr. Lovell SheehanJenkins' office notifying her of this.  Her EKG appears overall stable over the past several years.  No CV symptoms documented at her PAT visit. Her BP will be rechecked on arrival.  If no acute changes and BP is reasonable then I would anticipate that she could proceed as planned.  Velna Ochsllison Joshawa Dubin, PA-C [facility]/Anesthesiology Phone [phone] 01/18/2015 2:40 PM

## 2015-01-24 MED ORDER — CEFAZOLIN SODIUM-DEXTROSE 2-3 GM-% IV SOLR: 2.0000 g | INTRAVENOUS | Status: DC

## 2015-01-25 ENCOUNTER — Inpatient Hospital Stay (HOSPITAL_COMMUNITY): Admission: RE | Admit: 2015-01-25 | Payer: Medicare Other | Source: Ambulatory Visit | Admitting: Neurosurgery

## 2015-01-25 ENCOUNTER — Other Ambulatory Visit (HOSPITAL_COMMUNITY): Payer: Self-pay | Admitting: Neurosurgery

## 2015-01-25 ENCOUNTER — Encounter (HOSPITAL_COMMUNITY): Admission: RE | Payer: Self-pay | Source: Ambulatory Visit

## 2015-01-25 SURGERY — POSTERIOR LUMBAR FUSION 1 LEVEL
Anesthesia: General

## 2015-02-16 ENCOUNTER — Encounter (HOSPITAL_COMMUNITY): Payer: Self-pay | Admitting: Pharmacy Technician

## 2015-02-16 NOTE — Pre-Procedure Instructions (Signed)
Rickey PrimusBeverly R Welby  02/16/2015   Your procedure is scheduled on:  03/01/15  [facility] at 6 AM.  Call this number if you have problems the morning of surgery: [phone]   Remember:   Do not eat food or drink liquids after midnight.   Take these medicines the morning of surgery with A SIP OF WATER: oxycodone   Do not wear jewelry, make-up or nail polish.  Do not wear lotions, powders, or perfumes. You may wear deodorant.  Do not shave 48 hours prior to surgery. Men may shave face and neck.  Do not bring valuables to the hospital.  [facility] is not responsible                  for any belongings or valuables.               Contacts, dentures or bridgework may not be worn into surgery.  Leave suitcase in the car. After surgery it may be brought to your room.  For patients admitted to the hospital, discharge time is determined by your                treatment team.               Patients discharged the day of surgery will not be allowed to drive  home.  Name and phone number of your driver: family  Special Instructions: Shower using CHG 2 nights before surgery and the night before surgery.  If you shower the day of surgery use CHG.  Use special wash - you have one bottle of CHG for all showers.  You should use approximately 1/3 of the bottle for each shower.   Please read over the following fact sheets that you were given: Pain Booklet, Coughing and Deep Breathing, Blood Transfusion Information, MRSA Information and Surgical Site Infection Prevention

## 2015-02-17 ENCOUNTER — Other Ambulatory Visit (HOSPITAL_COMMUNITY): Payer: Self-pay

## 2015-02-17 ENCOUNTER — Encounter (HOSPITAL_COMMUNITY)
Admission: RE | Admit: 2015-02-17 | Discharge: 2015-02-17 | Disposition: A | Discharge status: Home / Self Care | Payer: Medicare Other | Source: Ambulatory Visit | Attending: Physical Medicine and Rehabilitation | Admitting: Physical Medicine and Rehabilitation

## 2015-02-17 ENCOUNTER — Encounter (HOSPITAL_COMMUNITY): Payer: Self-pay

## 2015-02-17 DIAGNOSIS — Z01812 Encounter for preprocedural laboratory examination: Secondary | ICD-10-CM | POA: Diagnosis not present

## 2015-02-17 LAB — BASIC METABOLIC PANEL
Anion gap: 7 (ref 5–15)
BUN: 18 mg/dL (ref 6–23)
CALCIUM: 9.2 mg/dL (ref 8.4–10.5)
CO2: 27 mmol/L (ref 19–32)
Chloride: 105 mmol/L (ref 96–112)
Creatinine, Ser: 1.1 mg/dL (ref 0.50–1.10)
GFR calc Af Amer: 64 mL/min — ABNORMAL LOW (ref >90)
GFR, EST NON AFRICAN AMERICAN: 55 mL/min — AB (ref >90)
Glucose, Bld: 112 mg/dL — ABNORMAL HIGH (ref 70–99)
Potassium: 3.8 mmol/L (ref 3.5–5.1)
Sodium: 139 mmol/L (ref 135–145)

## 2015-02-17 LAB — CBC
HCT: 38.4 % (ref 36.0–46.0)
Hemoglobin: 12.9 g/dL (ref 12.0–15.0)
MCH: 30 pg (ref 26.0–34.0)
MCHC: 33.6 g/dL (ref 30.0–36.0)
MCV: 89.3 fL (ref 78.0–100.0)
PLATELETS: 190 10*3/uL (ref 150–400)
RBC: 4.3 MIL/uL (ref 3.87–5.11)
RDW: 15 % (ref 11.5–15.5)
WBC: 5.4 10*3/uL (ref 4.0–10.5)

## 2015-02-17 LAB — TYPE AND SCREEN
ABO/RH(D): A POS
ANTIBODY SCREEN: NEGATIVE

## 2015-02-20 ENCOUNTER — Inpatient Hospital Stay (HOSPITAL_COMMUNITY): Admission: RE | Admit: 2015-02-20 | Payer: Self-pay | Source: Ambulatory Visit

## 2015-02-20 ENCOUNTER — Encounter (HOSPITAL_COMMUNITY): Payer: Self-pay | Admitting: Emergency Medicine

## 2015-02-23 DIAGNOSIS — H5203 Hypermetropia, bilateral: Secondary | ICD-10-CM | POA: Diagnosis not present

## 2015-03-01 ENCOUNTER — Encounter (HOSPITAL_COMMUNITY): Admission: RE | Payer: Self-pay | Source: Ambulatory Visit

## 2015-03-01 ENCOUNTER — Inpatient Hospital Stay (HOSPITAL_COMMUNITY): Admission: RE | Admit: 2015-03-01 | Payer: Medicare Other | Source: Ambulatory Visit | Admitting: Neurosurgery

## 2015-03-01 SURGERY — POSTERIOR LUMBAR FUSION 1 LEVEL
Anesthesia: General

## 2015-04-12 DIAGNOSIS — R42 Dizziness and giddiness: Secondary | ICD-10-CM | POA: Diagnosis not present

## 2015-04-12 DIAGNOSIS — I10 Essential (primary) hypertension: Secondary | ICD-10-CM | POA: Diagnosis not present

## 2015-05-16 DIAGNOSIS — I10 Essential (primary) hypertension: Secondary | ICD-10-CM | POA: Diagnosis not present

## 2015-05-26 DIAGNOSIS — M545 Low back pain: Secondary | ICD-10-CM | POA: Diagnosis not present

## 2015-05-26 DIAGNOSIS — M542 Cervicalgia: Secondary | ICD-10-CM | POA: Diagnosis not present

## 2015-06-14 DIAGNOSIS — M545 Low back pain: Secondary | ICD-10-CM | POA: Diagnosis not present

## 2015-06-14 DIAGNOSIS — Z79899 Other long term (current) drug therapy: Secondary | ICD-10-CM | POA: Diagnosis not present

## 2015-06-14 DIAGNOSIS — M542 Cervicalgia: Secondary | ICD-10-CM | POA: Diagnosis not present

## 2015-06-14 DIAGNOSIS — M488X6 Other specified spondylopathies, lumbar region: Secondary | ICD-10-CM | POA: Diagnosis not present

## 2015-06-14 DIAGNOSIS — E669 Obesity, unspecified: Secondary | ICD-10-CM | POA: Diagnosis not present

## 2015-06-14 DIAGNOSIS — M79606 Pain in leg, unspecified: Secondary | ICD-10-CM | POA: Diagnosis not present

## 2015-06-14 DIAGNOSIS — G894 Chronic pain syndrome: Secondary | ICD-10-CM | POA: Diagnosis not present

## 2015-06-14 DIAGNOSIS — M47817 Spondylosis without myelopathy or radiculopathy, lumbosacral region: Secondary | ICD-10-CM | POA: Diagnosis not present

## 2015-07-06 DIAGNOSIS — M545 Low back pain: Secondary | ICD-10-CM | POA: Diagnosis not present

## 2015-07-06 DIAGNOSIS — M79609 Pain in unspecified limb: Secondary | ICD-10-CM | POA: Diagnosis not present

## 2015-07-13 DIAGNOSIS — G5602 Carpal tunnel syndrome, left upper limb: Secondary | ICD-10-CM | POA: Diagnosis not present

## 2015-07-13 DIAGNOSIS — G5601 Carpal tunnel syndrome, right upper limb: Secondary | ICD-10-CM | POA: Diagnosis not present

## 2015-07-18 DIAGNOSIS — M545 Low back pain: Secondary | ICD-10-CM | POA: Diagnosis not present

## 2015-07-18 DIAGNOSIS — M1990 Unspecified osteoarthritis, unspecified site: Secondary | ICD-10-CM | POA: Diagnosis not present

## 2015-07-18 DIAGNOSIS — M79606 Pain in leg, unspecified: Secondary | ICD-10-CM | POA: Diagnosis not present

## 2015-07-18 DIAGNOSIS — M47817 Spondylosis without myelopathy or radiculopathy, lumbosacral region: Secondary | ICD-10-CM | POA: Diagnosis not present

## 2015-07-18 DIAGNOSIS — G894 Chronic pain syndrome: Secondary | ICD-10-CM | POA: Diagnosis not present

## 2015-07-18 DIAGNOSIS — Z79899 Other long term (current) drug therapy: Secondary | ICD-10-CM | POA: Diagnosis not present

## 2015-07-21 DIAGNOSIS — G8929 Other chronic pain: Secondary | ICD-10-CM | POA: Diagnosis not present

## 2015-08-19 DIAGNOSIS — M546 Pain in thoracic spine: Secondary | ICD-10-CM | POA: Diagnosis not present

## 2015-08-19 DIAGNOSIS — M5441 Lumbago with sciatica, right side: Secondary | ICD-10-CM | POA: Diagnosis not present

## 2015-08-19 DIAGNOSIS — G890 Central pain syndrome: Secondary | ICD-10-CM | POA: Diagnosis not present

## 2015-08-19 DIAGNOSIS — Z79899 Other long term (current) drug therapy: Secondary | ICD-10-CM | POA: Diagnosis not present

## 2015-08-19 DIAGNOSIS — G603 Idiopathic progressive neuropathy: Secondary | ICD-10-CM | POA: Diagnosis not present

## 2015-08-19 DIAGNOSIS — M545 Low back pain: Secondary | ICD-10-CM | POA: Diagnosis not present

## 2015-09-08 DIAGNOSIS — M25561 Pain in right knee: Secondary | ICD-10-CM | POA: Diagnosis not present

## 2015-09-08 DIAGNOSIS — G890 Central pain syndrome: Secondary | ICD-10-CM | POA: Diagnosis not present

## 2015-09-08 DIAGNOSIS — M545 Low back pain: Secondary | ICD-10-CM | POA: Diagnosis not present

## 2015-09-08 DIAGNOSIS — Z79899 Other long term (current) drug therapy: Secondary | ICD-10-CM | POA: Diagnosis not present

## 2015-09-08 DIAGNOSIS — M25551 Pain in right hip: Secondary | ICD-10-CM | POA: Diagnosis not present

## 2015-09-08 DIAGNOSIS — M542 Cervicalgia: Secondary | ICD-10-CM | POA: Diagnosis not present

## 2015-09-29 DIAGNOSIS — G8929 Other chronic pain: Secondary | ICD-10-CM | POA: Diagnosis not present

## 2015-09-29 DIAGNOSIS — M545 Low back pain: Secondary | ICD-10-CM | POA: Diagnosis not present

## 2015-10-13 DIAGNOSIS — M79651 Pain in right thigh: Secondary | ICD-10-CM | POA: Diagnosis not present

## 2015-10-13 DIAGNOSIS — M25551 Pain in right hip: Secondary | ICD-10-CM | POA: Diagnosis not present

## 2015-10-13 DIAGNOSIS — M545 Low back pain: Secondary | ICD-10-CM | POA: Diagnosis not present

## 2015-10-13 DIAGNOSIS — M25561 Pain in right knee: Secondary | ICD-10-CM | POA: Diagnosis not present

## 2015-10-13 DIAGNOSIS — G890 Central pain syndrome: Secondary | ICD-10-CM | POA: Diagnosis not present

## 2015-11-09 DIAGNOSIS — G8929 Other chronic pain: Secondary | ICD-10-CM | POA: Diagnosis not present

## 2015-11-09 DIAGNOSIS — M545 Low back pain: Secondary | ICD-10-CM | POA: Diagnosis not present

## 2018-07-27 DIAGNOSIS — H5213 Myopia, bilateral: Secondary | ICD-10-CM | POA: Diagnosis not present

## 2018-12-10 ENCOUNTER — Ambulatory Visit (INDEPENDENT_AMBULATORY_CARE_PROVIDER_SITE_OTHER): Payer: Medicare Other | Admitting: Podiatry

## 2018-12-10 VITALS — BP 159/105 | HR 86

## 2018-12-10 DIAGNOSIS — B351 Tinea unguium: Secondary | ICD-10-CM

## 2018-12-10 DIAGNOSIS — M79675 Pain in left toe(s): Secondary | ICD-10-CM | POA: Diagnosis not present

## 2018-12-10 DIAGNOSIS — M79674 Pain in right toe(s): Secondary | ICD-10-CM

## 2018-12-10 DIAGNOSIS — Q828 Other specified congenital malformations of skin: Secondary | ICD-10-CM

## 2018-12-10 DIAGNOSIS — L600 Ingrowing nail: Secondary | ICD-10-CM | POA: Diagnosis not present

## 2018-12-10 DIAGNOSIS — I10 Essential (primary) hypertension: Secondary | ICD-10-CM | POA: Insufficient documentation

## 2018-12-13 NOTE — Progress Notes (Signed)
Subjective:   Patient ID: Deborah Mcmahon, female   DOB: 57 y.o.   MRN: [id number]   HPI 58 year old female presents the office today for concerns of right pain on the big toenail which is been all over the last 6 months and she also has a callus on the ball of her left foot.  Also her nails are thickened elongated she cannot trim them herself in general.  She denies any open sores.  She denies any swelling.  No drainage or pus coming in the toenail sites.  No other concerns.   Review of Systems  All other systems reviewed and are negative.  Past Medical History:  Diagnosis Date  . Carpal tunnel syndrome    bilateral  . Chronic back pain    spondylolisthesis  . Constipation    takes an OTC stool softener daily as needed  . Gallstones   . History of migraine   . Hypertension   . Nocturia   . Weakness    tingling     Past Surgical History:  Procedure Laterality Date  . BACK SURGERY     fusion  . CERVICAL SPINE SURGERY     x 4-fusion  . COLONOSCOPY    . MYRINGOTOMY    . right knee surgery     screws  . TONSILLECTOMY AND ADENOIDECTOMY    . TUBAL LIGATION       Current Outpatient Medications:  .  amLODipine (NORVASC) 10 MG tablet, amlodipine 10 mg tablet, Disp: , Rfl:  .  DULoxetine HCl 20 MG CSDR, duloxetine 20 mg capsule,delayed release, Disp: , Rfl:  .  HYDROcodone-acetaminophen (NORCO) 10-325 MG tablet, hydrocodone 10 mg-acetaminophen 325 mg tablet, Disp: , Rfl:  .  losartan-hydrochlorothiazide (HYZAAR) 100-12.5 MG per tablet, Take 1 tablet by mouth daily., Disp: , Rfl:  .  meloxicam (MOBIC) 7.5 MG tablet, meloxicam 7.5 mg tablet, Disp: , Rfl:  .  Morphine-Naltrexone (EMBEDA) 20-0.8 MG CPCR, Embeda 20 mg-0.8 mg capsule, extend release, oral only, Disp: , Rfl:  .  oxyCODONE-acetaminophen (PERCOCET) 10-325 MG per tablet, Take 1-2 tablets by mouth every 4 (four) hours as needed for pain. , Disp: , Rfl:  .  tiZANidine (ZANAFLEX) 2 MG tablet, tizanidine 2 mg tablet, Disp: ,  Rfl:   Allergies  Allergen Reactions  . Hydrocodone-Acetaminophen   . Hydrocodone Other (See Comments)    itching         Objective:  Physical Exam  General: AAO x3, NAD  Dermatological: Nails are hypertrophic, dystrophic, discolored with yellow and Kroon discoloration to the toenails.  There is incurvation present along the medial aspect the right hallux toenail but there is no edema, erythema, drainage or pus or any signs of infection.  Hyperkeratotic lesion submetatarsal 2 on the left foot.  Upon debridement there is no underlying ulceration drainage or any signs of infection noted today.  No open lesions.  Vascular: Dorsalis Pedis artery and Posterior Tibial artery pedal pulses are 2/4 bilateral with immedate capillary fill time. There is no pain with calf compression, swelling, warmth, erythema.   Neruologic: Grossly intact via light touch bilateral.Protective threshold with Semmes Wienstein monofilament intact to all pedal sites bilateral.   Musculoskeletal: Tenderness to the distal aspect the ingrown toenails no other areas of tenderness elicited otherwise besides the toenail and callus sites.  No area pinpoint tenderness.  Muscular strength 5/5 in all groups tested bilateral.  Gait: Unassisted, Nonantalgic.       Assessment:   Symptomatic onychomycosis, ingrown toenail  with hyperkeratotic lesion    Plan:  -Treatment options discussed including all alternatives, risks, and complications -Etiology of symptoms were discussed -I sharply debrided the symptomatic portion of ingrown toenail with any complications or bleeding.  Debrided the toenails with any complications or bleeding x10. -Debrided the hyperkeratotic lesion x1 without any complications or bleeding.  Discussed moisturizer to the areas daily. -Discussed the importance of daily foot inspection.  Return if symptoms worsen or fail to improve.  Vivi Barrack DPM

## 2018-12-17 DIAGNOSIS — Z79899 Other long term (current) drug therapy: Secondary | ICD-10-CM | POA: Diagnosis not present

## 2018-12-17 DIAGNOSIS — Z1159 Encounter for screening for other viral diseases: Secondary | ICD-10-CM | POA: Diagnosis not present

## 2020-10-02 ENCOUNTER — Other Ambulatory Visit (HOSPITAL_COMMUNITY): Payer: Self-pay | Admitting: Registered Nurse

## 2020-10-02 DIAGNOSIS — R0602 Shortness of breath: Secondary | ICD-10-CM

## 2020-10-02 DIAGNOSIS — R600 Localized edema: Secondary | ICD-10-CM

## 2020-10-26 ENCOUNTER — Encounter (HOSPITAL_COMMUNITY): Payer: Self-pay | Admitting: Cardiology

## 2020-10-26 ENCOUNTER — Other Ambulatory Visit (HOSPITAL_COMMUNITY): Payer: Medicare Other

## 2020-10-26 NOTE — Progress Notes (Unsigned)
Patient ID: Deborah Mcmahon, female   DOB: 09/18/1961, 59 y.o.   MRN: [id number]   Verified appointment "no show" status with Helmut Muster at 3:56pm.

## 2020-11-13 ENCOUNTER — Ambulatory Visit (INDEPENDENT_AMBULATORY_CARE_PROVIDER_SITE_OTHER)
Admission: EM | Admit: 2020-11-13 | Discharge: 2020-11-13 | Disposition: A | Discharge status: Home / Self Care | Payer: Medicare Other | Source: Home / Self Care | Attending: Family Medicine | Admitting: Family Medicine

## 2020-11-13 ENCOUNTER — Encounter (HOSPITAL_COMMUNITY): Payer: Self-pay

## 2020-11-13 DIAGNOSIS — I10 Essential (primary) hypertension: Secondary | ICD-10-CM | POA: Insufficient documentation

## 2020-11-13 DIAGNOSIS — Z20822 Contact with and (suspected) exposure to covid-19: Secondary | ICD-10-CM | POA: Insufficient documentation

## 2020-11-13 DIAGNOSIS — R42 Dizziness and giddiness: Secondary | ICD-10-CM

## 2020-11-13 DIAGNOSIS — I63212 Cerebral infarction due to unspecified occlusion or stenosis of left vertebral arteries: Secondary | ICD-10-CM | POA: Diagnosis not present

## 2020-11-13 DIAGNOSIS — R519 Headache, unspecified: Secondary | ICD-10-CM | POA: Insufficient documentation

## 2020-11-13 LAB — POCT URINALYSIS DIPSTICK, ED / UC
Bilirubin Urine: NEGATIVE
Glucose, UA: NEGATIVE mg/dL
Ketones, ur: NEGATIVE mg/dL
Leukocytes,Ua: NEGATIVE
Nitrite: NEGATIVE
Protein, ur: NEGATIVE mg/dL
Specific Gravity, Urine: 1.025 (ref 1.005–1.030)
Urobilinogen, UA: 0.2 mg/dL (ref 0.0–1.0)
pH: 5.5 (ref 5.0–8.0)

## 2020-11-13 LAB — SARS CORONAVIRUS 2 (TAT 6-24 HRS): SARS Coronavirus 2: NEGATIVE

## 2020-11-13 MED ORDER — LOSARTAN POTASSIUM-HCTZ 100-12.5 MG PO TABS: 1.0000 | ORAL_TABLET | Freq: Every day | ORAL | 0 refills | Status: DC

## 2020-11-13 NOTE — ED Triage Notes (Signed)
Pt C/o dizziness, headaches, vomiting, nausea X 4 days. Pt states she went to a funeral in [place] and when she got back she began to have sxs. She states she is not sure if she was exposed to COVID. Pt denies other sxs. Pt states she has not taken her BP medicine.

## 2020-11-13 NOTE — Discharge Instructions (Addendum)
  BP (!) 226/111 (BP Location: Left Arm)   Pulse 90   Temp 99 F (37.2 C) (Oral)   Resp 16   SpO2 95%    You have been tested for COVID-19 today. If your test returns positive, you will receive a phone call from [facility] regarding your results. Negative test results are not called. Both positive and negative results area always visible on MyChart. If you do not have a MyChart account, sign up instructions are provided in your discharge papers. Please do not hesitate to contact us should you have questions or concerns.

## 2020-11-14 ENCOUNTER — Inpatient Hospital Stay (HOSPITAL_COMMUNITY)
Admission: EM | Admit: 2020-11-14 | Discharge: 2020-11-17 | DRG: 064 | Disposition: A | Discharge status: Home Health | Payer: Medicare Other | Source: Ambulatory Visit | Attending: Internal Medicine | Admitting: Internal Medicine

## 2020-11-14 DIAGNOSIS — I10 Essential (primary) hypertension: Secondary | ICD-10-CM

## 2020-11-14 DIAGNOSIS — I161 Hypertensive emergency: Secondary | ICD-10-CM | POA: Diagnosis present

## 2020-11-14 DIAGNOSIS — R739 Hyperglycemia, unspecified: Secondary | ICD-10-CM | POA: Diagnosis present

## 2020-11-14 DIAGNOSIS — Z79899 Other long term (current) drug therapy: Secondary | ICD-10-CM

## 2020-11-14 DIAGNOSIS — E785 Hyperlipidemia, unspecified: Secondary | ICD-10-CM | POA: Diagnosis present

## 2020-11-14 DIAGNOSIS — E669 Obesity, unspecified: Secondary | ICD-10-CM | POA: Diagnosis present

## 2020-11-14 DIAGNOSIS — I639 Cerebral infarction, unspecified: Secondary | ICD-10-CM | POA: Diagnosis present

## 2020-11-14 DIAGNOSIS — Z91128 Patient's intentional underdosing of medication regimen for other reason: Secondary | ICD-10-CM

## 2020-11-14 DIAGNOSIS — F1410 Cocaine abuse, uncomplicated: Secondary | ICD-10-CM | POA: Diagnosis present

## 2020-11-14 DIAGNOSIS — R29700 NIHSS score 0: Secondary | ICD-10-CM | POA: Diagnosis present

## 2020-11-14 DIAGNOSIS — F1990 Other psychoactive substance use, unspecified, uncomplicated: Secondary | ICD-10-CM

## 2020-11-14 DIAGNOSIS — R519 Headache, unspecified: Secondary | ICD-10-CM

## 2020-11-14 DIAGNOSIS — Z8249 Family history of ischemic heart disease and other diseases of the circulatory system: Secondary | ICD-10-CM

## 2020-11-14 DIAGNOSIS — Z720 Tobacco use: Secondary | ICD-10-CM

## 2020-11-14 DIAGNOSIS — G936 Cerebral edema: Secondary | ICD-10-CM | POA: Diagnosis present

## 2020-11-14 DIAGNOSIS — T465X6A Underdosing of other antihypertensive drugs, initial encounter: Secondary | ICD-10-CM | POA: Diagnosis present

## 2020-11-14 DIAGNOSIS — G8194 Hemiplegia, unspecified affecting left nondominant side: Secondary | ICD-10-CM | POA: Diagnosis present

## 2020-11-14 DIAGNOSIS — Z20822 Contact with and (suspected) exposure to covid-19: Secondary | ICD-10-CM | POA: Diagnosis present

## 2020-11-14 DIAGNOSIS — I129 Hypertensive chronic kidney disease with stage 1 through stage 4 chronic kidney disease, or unspecified chronic kidney disease: Secondary | ICD-10-CM | POA: Diagnosis present

## 2020-11-14 DIAGNOSIS — Z9114 Patient's other noncompliance with medication regimen: Secondary | ICD-10-CM

## 2020-11-14 DIAGNOSIS — I63212 Cerebral infarction due to unspecified occlusion or stenosis of left vertebral arteries: Principal | ICD-10-CM | POA: Diagnosis present

## 2020-11-14 DIAGNOSIS — I6300 Cerebral infarction due to thrombosis of unspecified precerebral artery: Secondary | ICD-10-CM

## 2020-11-14 DIAGNOSIS — N1831 Chronic kidney disease, stage 3a: Secondary | ICD-10-CM | POA: Diagnosis present

## 2020-11-14 DIAGNOSIS — Z6837 Body mass index (BMI) 37.0-37.9, adult: Secondary | ICD-10-CM

## 2020-11-14 DIAGNOSIS — N183 Chronic kidney disease, stage 3 unspecified: Secondary | ICD-10-CM | POA: Diagnosis present

## 2020-11-14 DIAGNOSIS — F17210 Nicotine dependence, cigarettes, uncomplicated: Secondary | ICD-10-CM | POA: Diagnosis present

## 2020-11-14 NOTE — ED Triage Notes (Signed)
Pt here for eval of dizziness, "not feeling good" since Friday. States she was seen at [facility] yesterday for the same, advised that if s/s did not improve to be re-evaluated. Pt states she's unsure if she was exposed to covid, but denies symptoms related. Pt states she's restarted her HTN medication after not having taken it for 2wks.

## 2020-11-15 ENCOUNTER — Emergency Department (HOSPITAL_COMMUNITY): Payer: Medicare Other

## 2020-11-15 ENCOUNTER — Other Ambulatory Visit: Payer: Self-pay

## 2020-11-15 ENCOUNTER — Inpatient Hospital Stay (HOSPITAL_COMMUNITY): Payer: Medicare Other

## 2020-11-15 ENCOUNTER — Encounter (HOSPITAL_COMMUNITY): Payer: Self-pay

## 2020-11-15 DIAGNOSIS — I10 Essential (primary) hypertension: Secondary | ICD-10-CM

## 2020-11-15 DIAGNOSIS — I129 Hypertensive chronic kidney disease with stage 1 through stage 4 chronic kidney disease, or unspecified chronic kidney disease: Secondary | ICD-10-CM | POA: Diagnosis present

## 2020-11-15 DIAGNOSIS — E785 Hyperlipidemia, unspecified: Secondary | ICD-10-CM | POA: Diagnosis present

## 2020-11-15 DIAGNOSIS — Z720 Tobacco use: Secondary | ICD-10-CM

## 2020-11-15 DIAGNOSIS — I6300 Cerebral infarction due to thrombosis of unspecified precerebral artery: Secondary | ICD-10-CM | POA: Diagnosis not present

## 2020-11-15 DIAGNOSIS — Z79899 Other long term (current) drug therapy: Secondary | ICD-10-CM | POA: Diagnosis not present

## 2020-11-15 DIAGNOSIS — Z91128 Patient's intentional underdosing of medication regimen for other reason: Secondary | ICD-10-CM | POA: Diagnosis not present

## 2020-11-15 DIAGNOSIS — F1410 Cocaine abuse, uncomplicated: Secondary | ICD-10-CM | POA: Diagnosis present

## 2020-11-15 DIAGNOSIS — T465X6A Underdosing of other antihypertensive drugs, initial encounter: Secondary | ICD-10-CM | POA: Diagnosis present

## 2020-11-15 DIAGNOSIS — Z6837 Body mass index (BMI) 37.0-37.9, adult: Secondary | ICD-10-CM | POA: Diagnosis not present

## 2020-11-15 DIAGNOSIS — I63332 Cerebral infarction due to thrombosis of left posterior cerebral artery: Secondary | ICD-10-CM

## 2020-11-15 DIAGNOSIS — R29700 NIHSS score 0: Secondary | ICD-10-CM | POA: Diagnosis present

## 2020-11-15 DIAGNOSIS — R739 Hyperglycemia, unspecified: Secondary | ICD-10-CM | POA: Diagnosis present

## 2020-11-15 DIAGNOSIS — N183 Chronic kidney disease, stage 3 unspecified: Secondary | ICD-10-CM | POA: Diagnosis present

## 2020-11-15 DIAGNOSIS — Z9114 Patient's other noncompliance with medication regimen: Secondary | ICD-10-CM | POA: Diagnosis not present

## 2020-11-15 DIAGNOSIS — I63012 Cerebral infarction due to thrombosis of left vertebral artery: Secondary | ICD-10-CM | POA: Diagnosis not present

## 2020-11-15 DIAGNOSIS — F17210 Nicotine dependence, cigarettes, uncomplicated: Secondary | ICD-10-CM | POA: Diagnosis present

## 2020-11-15 DIAGNOSIS — E669 Obesity, unspecified: Secondary | ICD-10-CM | POA: Diagnosis present

## 2020-11-15 DIAGNOSIS — N1831 Chronic kidney disease, stage 3a: Secondary | ICD-10-CM

## 2020-11-15 DIAGNOSIS — I639 Cerebral infarction, unspecified: Secondary | ICD-10-CM | POA: Diagnosis not present

## 2020-11-15 DIAGNOSIS — G8194 Hemiplegia, unspecified affecting left nondominant side: Secondary | ICD-10-CM | POA: Diagnosis present

## 2020-11-15 DIAGNOSIS — Z8249 Family history of ischemic heart disease and other diseases of the circulatory system: Secondary | ICD-10-CM | POA: Diagnosis not present

## 2020-11-15 DIAGNOSIS — Z20822 Contact with and (suspected) exposure to covid-19: Secondary | ICD-10-CM | POA: Diagnosis present

## 2020-11-15 DIAGNOSIS — I63212 Cerebral infarction due to unspecified occlusion or stenosis of left vertebral arteries: Secondary | ICD-10-CM | POA: Diagnosis present

## 2020-11-15 DIAGNOSIS — I6389 Other cerebral infarction: Secondary | ICD-10-CM | POA: Diagnosis not present

## 2020-11-15 DIAGNOSIS — G936 Cerebral edema: Secondary | ICD-10-CM | POA: Diagnosis present

## 2020-11-15 DIAGNOSIS — I161 Hypertensive emergency: Secondary | ICD-10-CM | POA: Diagnosis present

## 2020-11-15 DIAGNOSIS — R519 Headache, unspecified: Secondary | ICD-10-CM | POA: Diagnosis present

## 2020-11-15 LAB — CBC WITH DIFFERENTIAL/PLATELET
Abs Immature Granulocytes: 0.03 10*3/uL (ref 0.00–0.07)
Basophils Absolute: 0 10*3/uL (ref 0.0–0.1)
Basophils Relative: 0 %
Eosinophils Absolute: 0.1 10*3/uL (ref 0.0–0.5)
Eosinophils Relative: 1 %
HCT: 44.6 % (ref 36.0–46.0)
Hemoglobin: 14.1 g/dL (ref 12.0–15.0)
Immature Granulocytes: 0 %
Lymphocytes Relative: 34 %
Lymphs Abs: 2.7 10*3/uL (ref 0.7–4.0)
MCH: 27.5 pg (ref 26.0–34.0)
MCHC: 31.6 g/dL (ref 30.0–36.0)
MCV: 87.1 fL (ref 80.0–100.0)
Monocytes Absolute: 0.9 10*3/uL (ref 0.1–1.0)
Monocytes Relative: 11 %
Neutro Abs: 4.3 10*3/uL (ref 1.7–7.7)
Neutrophils Relative %: 54 %
Platelets: 242 10*3/uL (ref 150–400)
RBC: 5.12 MIL/uL — ABNORMAL HIGH (ref 3.87–5.11)
RDW: 15.9 % — ABNORMAL HIGH (ref 11.5–15.5)
WBC: 8 10*3/uL (ref 4.0–10.5)
nRBC: 0 % (ref 0.0–0.2)

## 2020-11-15 LAB — COMPREHENSIVE METABOLIC PANEL
ALT: 44 U/L (ref 0–44)
AST: 35 U/L (ref 15–41)
Albumin: 3.1 g/dL — ABNORMAL LOW (ref 3.5–5.0)
Alkaline Phosphatase: 60 U/L (ref 38–126)
Anion gap: 11 (ref 5–15)
BUN: 18 mg/dL (ref 6–20)
CO2: 24 mmol/L (ref 22–32)
Calcium: 9.5 mg/dL (ref 8.9–10.3)
Chloride: 102 mmol/L (ref 98–111)
Creatinine, Ser: 1.21 mg/dL — ABNORMAL HIGH (ref 0.44–1.00)
GFR, Estimated: 51 mL/min — ABNORMAL LOW (ref >60)
Glucose, Bld: 116 mg/dL — ABNORMAL HIGH (ref 70–99)
Potassium: 3.9 mmol/L (ref 3.5–5.1)
Sodium: 137 mmol/L (ref 135–145)
Total Bilirubin: 0.4 mg/dL (ref 0.3–1.2)
Total Protein: 7 g/dL (ref 6.5–8.1)

## 2020-11-15 LAB — PROTIME-INR
INR: 1 (ref 0.8–1.2)
Prothrombin Time: 13 seconds (ref 11.4–15.2)

## 2020-11-15 LAB — ECHOCARDIOGRAM COMPLETE
Area-P 1/2: 3.65 cm2
Height: 64 in
S' Lateral: 2.4 cm
Weight: 3456 oz

## 2020-11-15 LAB — RESP PANEL BY RT-PCR (FLU A&B, COVID) ARPGX2
Influenza A by PCR: NEGATIVE
Influenza B by PCR: NEGATIVE
SARS Coronavirus 2 by RT PCR: NEGATIVE

## 2020-11-15 LAB — RAPID URINE DRUG SCREEN, HOSP PERFORMED
Amphetamines: NOT DETECTED
Barbiturates: NOT DETECTED
Benzodiazepines: NOT DETECTED
Cocaine: POSITIVE — AB
Opiates: NOT DETECTED
Tetrahydrocannabinol: NOT DETECTED

## 2020-11-15 LAB — TROPONIN I (HIGH SENSITIVITY)
Troponin I (High Sensitivity): 17 ng/L (ref <18)
Troponin I (High Sensitivity): 15 ng/L (ref <18)

## 2020-11-15 LAB — APTT: aPTT: 30 seconds (ref 24–36)

## 2020-11-15 MED ORDER — LABETALOL HCL 5 MG/ML IV SOLN: 10.0000 mg | INTRAVENOUS | Status: DC | PRN

## 2020-11-15 MED ORDER — AMLODIPINE BESYLATE 10 MG PO TABS
10.0000 mg | ORAL_TABLET | Freq: Every day | ORAL | Status: DC
  Administered 2020-11-15 – 2020-11-17 (×3): 10 mg via ORAL
  Filled 2020-11-15 (×3): qty 1

## 2020-11-15 MED ORDER — ALBUTEROL SULFATE HFA 108 (90 BASE) MCG/ACT IN AERS
2.0000 | INHALATION_SPRAY | Freq: Four times a day (QID) | RESPIRATORY_TRACT | Status: DC | PRN
  Filled 2020-11-15: qty 6.7

## 2020-11-15 MED ORDER — LABETALOL HCL 5 MG/ML IV SOLN
10.0000 mg | Freq: Once | INTRAVENOUS | Status: AC
  Administered 2020-11-15: 14:00:00 10 mg via INTRAVENOUS
  Filled 2020-11-15: qty 4

## 2020-11-15 MED ORDER — LABETALOL HCL 5 MG/ML IV SOLN: 20.0000 mg | Freq: Once | INTRAVENOUS | Status: DC

## 2020-11-15 MED ORDER — HYDROCHLOROTHIAZIDE 12.5 MG PO CAPS
12.5000 mg | ORAL_CAPSULE | Freq: Every day | ORAL | Status: DC
  Administered 2020-11-15 – 2020-11-17 (×3): 12.5 mg via ORAL
  Filled 2020-11-15 (×3): qty 1

## 2020-11-15 MED ORDER — LABETALOL HCL 5 MG/ML IV SOLN
10.0000 mg | INTRAVENOUS | Status: DC | PRN
  Administered 2020-11-15 (×2): 10 mg via INTRAVENOUS
  Filled 2020-11-15 (×2): qty 4

## 2020-11-15 MED ORDER — LOSARTAN POTASSIUM 50 MG PO TABS
100.0000 mg | ORAL_TABLET | Freq: Every day | ORAL | Status: DC
  Administered 2020-11-15 – 2020-11-17 (×3): 100 mg via ORAL
  Filled 2020-11-15 (×3): qty 2

## 2020-11-15 MED ORDER — GADOBUTROL 1 MMOL/ML IV SOLN
10.0000 mL | Freq: Once | INTRAVENOUS | Status: AC | PRN
  Administered 2020-11-15: 10 mL via INTRAVENOUS

## 2020-11-15 MED ORDER — STROKE: EARLY STAGES OF RECOVERY BOOK
Freq: Once | Status: AC
  Filled 2020-11-15: qty 1

## 2020-11-15 MED ORDER — ATORVASTATIN CALCIUM 40 MG PO TABS
40.0000 mg | ORAL_TABLET | Freq: Every day | ORAL | Status: DC
  Administered 2020-11-15 – 2020-11-17 (×3): 40 mg via ORAL
  Filled 2020-11-15 (×3): qty 1

## 2020-11-15 MED ORDER — LOSARTAN POTASSIUM-HCTZ 100-12.5 MG PO TABS: 1.0000 | ORAL_TABLET | Freq: Every day | ORAL | Status: DC

## 2020-11-15 NOTE — ED Notes (Signed)
Triage rn notified of patient blood pressure 

## 2020-11-15 NOTE — H&P (Signed)
History and Physical  Patient Name: Deborah Mcmahon     ZOX:[id number]    DOB: 03/30/1961    DOA: 11/14/2020 PCP: Patient, No Pcp Per  Patient coming from: Home  Chief Complaint: Dizziness and elevated blood pressure   HPI: Deborah Mcmahon is a 59 y.o. female with hx of tobacco abuse, obesity, essential hypertension, who presented to the hospital on 11/15/2020 secondary to headache, dizziness, and elevated blood pressure at home.  At time of exam, patient is lethargic, sleepy, not very conversive, thus complete and thorough HPI review of systems difficult to obtain.  No family bedside as well.   Patient was evaluated in the ED 2 days ago, 11/13/2020, for persistent throbbing headache.  At that time her blood pressure was gently elevated, 220/110, and she had been out of her blood pressure medications for over 2 weeks.  She was prescribed losartan/HCTZ, declined ED evaluation and discharged home.  She tells me she started taking these blood pressure medication yesterday.  However she cannot tell me what medication she takes at home.  On initial presentation to the ED, she says she has been dizzy for the past 5 days, not feeling well.  She has had associated dizziness and nausea.  Apparently she was seen by urgent care yesterday and advised that if her symptoms persisted, to be seen in the ED.   ED course: -Initial presentation, blood pressure 206/114, maintaining sats on room air, afebrile, heart rate 94. -Relevant labs: Presentation: Sodium 137, potassium 3.9, chloride 102, bicarb 24, glucose 116, BUN 18, creatinine 1.21, troponin XVII, WBC 8, hemoglobin 14, platelets 242, Covid and influenza negative. -CT head showed suspected vasogenic edema and inferior to mid left cerebellum, concern for acute CVA versus potential mass. 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 -Neurology consulted from the  ED -The hospitalist service was contacted for further evaluation and management.     ROS: Review of Systems  Constitutional: Positive for malaise/fatigue.  Respiratory: Negative for cough and shortness of breath.   Cardiovascular: Negative for chest pain and leg swelling.  Gastrointestinal: Positive for nausea and vomiting. Negative for abdominal pain and constipation.  Genitourinary: Negative for dysuria and hematuria.  Skin: Negative.   Neurological: Positive for dizziness and weakness. Negative for seizures and loss of consciousness.  Endo/Heme/Allergies: Negative.           Past Medical History:  Diagnosis Date  . Carpal tunnel syndrome    bilateral  . Chronic back pain    spondylolisthesis  . Constipation    takes an OTC stool softener daily as needed  . Gallstones   . History of migraine   . Hypertension   . Nocturia   . Weakness    tingling     Past Surgical History:  Procedure Laterality Date  . BACK SURGERY     fusion  . CERVICAL SPINE SURGERY     x 4-fusion  . COLONOSCOPY    . MYRINGOTOMY    . right knee surgery     screws  . TONSILLECTOMY AND ADENOIDECTOMY    . TUBAL LIGATION      Social History: Patient lives at home.  The patient walks  Without assistance.  Current smoker.  Allergies  Allergen Reactions  . Hydrocodone-Acetaminophen   . Hydrocodone Other (See Comments)    itching    Family history: She denies family history of CVA family history is not  on file.  Prior to Admission medications   Medication Sig Start Date End Date Taking? Authorizing Provider  amLODipine (NORVASC) 10 MG tablet amlodipine 10 mg tablet    [provider]  losartan-hydrochlorothiazide (HYZAAR) 100-12.5 MG tablet Take 1 tablet by mouth daily. 11/13/20   Mardella Layman, MD  DULoxetine HCl 20 MG CSDR duloxetine 20 mg capsule,delayed release  11/13/20  [provider]       Physical Exam: BP (!) 196/125   Pulse 91   Temp 98.2 F (36.8 C)  (Oral)   Resp 19   Ht 5\' 4"  (1.626 m)   Wt 98 kg   SpO2 97%   BMI 37.08 kg/m  General appearance: Well-developed, adult female, alert and in no distress, but sleepy.   Eyes: Anicteric, conjunctiva pink, lids and lashes normal. PERRL.    ENT: No nasal deformity, discharge, epistaxis.  Hearing normal.  Neck: No neck masses.  Trachea midline.  No thyromegaly/tenderness. Lymph: No cervical or supraclavicular lymphadenopathy. Skin: Warm and dry.  No jaundice.  No suspicious rashes or lesions. Cardiac: RRR, nl S1-S2, no murmurs appreciated.  Capillary refill is brisk.    No LE edema.  Radial and pedal pulses 2+ and symmetric. Respiratory: Normal respiratory rate and rhythm.  CTAB without rales or wheezes. Abdomen: Abdomen soft.  Bowel sounds present.  No ascites, distension, hepatosplenomegaly.   MSK: No deformities or effusions of the large joints of the upper or lower extremities bilaterally.  No cyanosis or clubbing. Neuro: Cranial nerves 2-12 grossly intact.  Sensation intact to light touch. Speech is fluent.  Muscle strength difficult to assess due to patient's lethargy.    Psych: Difficult to assess due to patient being lethargic     Labs on Admission:  I have personally reviewed following labs and imaging studies: CBC: Recent Labs  Lab 11/15/20 0754  WBC 8.0  NEUTROABS 4.3  HGB 14.1  HCT 44.6  MCV 87.1  PLT 242   Basic Metabolic Panel: Recent Labs  Lab 11/15/20 0754  NA 137  K 3.9  CL 102  CO2 24  GLUCOSE 116*  BUN 18  CREATININE 1.21*  CALCIUM 9.5   GFR: Estimated Creatinine Clearance: 56.2 mL/min (A) (by C-G formula based on SCr of 1.21 mg/dL (H)).  Liver Function Tests: Recent Labs  Lab 11/15/20 0754  AST 35  ALT 44  ALKPHOS 60  BILITOT 0.4  PROT 7.0  ALBUMIN 3.1*   No results for input(s): LIPASE, AMYLASE in the last 168 hours. No results for input(s): AMMONIA in the last 168 hours. Coagulation Profile: No results for input(s): INR, PROTIME in the  last 168 hours. Cardiac Enzymes: No results for input(s): CKTOTAL, CKMB, CKMBINDEX, TROPONINI in the last 168 hours. BNP (last 3 results) No results for input(s): PROBNP in the last 8760 hours. HbA1C: No results for input(s): HGBA1C in the last 72 hours. CBG: No results for input(s): GLUCAP in the last 168 hours. Lipid Profile: No results for input(s): CHOL, HDL, LDLCALC, TRIG, CHOLHDL, LDLDIRECT in the last 72 hours. Thyroid Function Tests: No results for input(s): TSH, T4TOTAL, FREET4, T3FREE, THYROIDAB in the last 72 hours. Anemia Panel: No results for input(s): VITAMINB12, FOLATE, FERRITIN, TIBC, IRON, RETICCTPCT in the last 72 hours.   Recent Results (from the past 240 hour(s))  SARS CORONAVIRUS 2 (TAT 6-24 HRS) Nasopharyngeal Nasopharyngeal Swab     Status: None   Collection Time: 11/13/20  1:42 PM   Specimen: Nasopharyngeal Swab  Result Value Ref Range  Status   SARS Coronavirus 2 NEGATIVE NEGATIVE Final    Comment: (NOTE) SARS-CoV-2 target nucleic acids are NOT DETECTED.  The SARS-CoV-2 RNA is generally detectable in upper and lower respiratory specimens during the acute phase of infection. Negative results do not preclude SARS-CoV-2 infection, do not rule out co-infections with other pathogens, and should not be used as the sole basis for treatment or other patient management decisions. Negative results must be combined with clinical observations, patient history, and epidemiological information. The expected result is Negative.  Fact Sheet for Patients: HairSlick.no  Fact Sheet for Healthcare Providers: quierodirigir.com  This test is not yet approved or cleared by the [place] FDA and  has been authorized for detection and/or diagnosis of SARS-CoV-2 by FDA under an Emergency Use Authorization (EUA). This EUA will remain  in effect (meaning this test can be used) for the duration of the COVID-19  declaration under Se ction 564(b)(1) of the Act, 21 U.S.C. section 360bbb-3(b)(1), unless the authorization is terminated or revoked sooner.  Performed at [facility], [address], [place], [place] [address]   Resp Panel by RT-PCR (Flu A&B, Covid) Nasopharyngeal Swab     Status: None   Collection Time: 11/15/20  9:40 AM   Specimen: Nasopharyngeal Swab; Nasopharyngeal(NP) swabs in vial transport medium  Result Value Ref Range Status   SARS Coronavirus 2 by RT PCR NEGATIVE NEGATIVE Final    Comment: (NOTE) SARS-CoV-2 target nucleic acids are NOT DETECTED.  The SARS-CoV-2 RNA is generally detectable in upper respiratory specimens during the acute phase of infection. The lowest concentration of SARS-CoV-2 viral copies this assay can detect is 138 copies/mL. A negative result does not preclude SARS-Cov-2 infection and should not be used as the sole basis for treatment or other patient management decisions. A negative result may occur with  improper specimen collection/handling, submission of specimen other than nasopharyngeal swab, presence of viral mutation(s) within the areas targeted by this assay, and inadequate number of viral copies(<138 copies/mL). A negative result must be combined with clinical observations, patient history, and epidemiological information. The expected result is Negative.  Fact Sheet for Patients:  BloggerCourse.com  Fact Sheet for Healthcare Providers:  SeriousBroker.it  This test is no t yet approved or cleared by the [place] FDA and  has been authorized for detection and/or diagnosis of SARS-CoV-2 by FDA under an Emergency Use Authorization (EUA). This EUA will remain  in effect (meaning this test can be used) for the duration of the COVID-19 declaration under Section 564(b)(1) of the Act, 21 U.S.C.section 360bbb-3(b)(1), unless the authorization is terminated  or revoked sooner.        Influenza A by PCR NEGATIVE NEGATIVE Final   Influenza B by PCR NEGATIVE NEGATIVE Final    Comment: (NOTE) The Xpert Xpress SARS-CoV-2/FLU/RSV plus assay is intended as an aid in the diagnosis of influenza from Nasopharyngeal swab specimens and should not be used as a sole basis for treatment. Nasal washings and aspirates are unacceptable for Xpert Xpress SARS-CoV-2/FLU/RSV testing.  Fact Sheet for Patients: BloggerCourse.com  Fact Sheet for Healthcare Providers: SeriousBroker.it  This test is not yet approved or cleared by the [place] FDA and has been authorized for detection and/or diagnosis of SARS-CoV-2 by FDA under an Emergency Use Authorization (EUA). This EUA will remain in effect (meaning this test can be used) for the duration of the COVID-19 declaration under Section 564(b)(1) of the Act, 21 U.S.C. section 360bbb-3(b)(1), unless the authorization is terminated or  revoked.  Performed at [facility], 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  Additionally small acute infarct in the lateral right cerebellum CT Head Wo Contrast  Result Date: 11/15/2020 CLINICAL DATA:  Dizziness EXAM: CT HEAD WITHOUT CONTRAST TECHNIQUE: Contiguous axial images were obtained from the base of the skull through the vertex without intravenous contrast. COMPARISON:  None. FINDINGS: Brain: Ventricles and sulci overall are normal in size and configuration. There is vasogenic edema throughout much of the mid to lower left cerebellar hemisphere which impresses upon the posterior leftward aspect of the fourth ventricle as well as impressing upon the leftward aspect of the lower pons and medulla. Elsewhere, there is patchy small vessel disease in the centra semiovale  bilaterally. There is decreased attenuation in the posterior right centrum semiovale extending to the gray-white junction which may represent a recent infarct in this area. There is no hemorrhage, extra-axial fluid collection, midline shift. A well-defined mass is not seen, although there is concern for mass with surrounding edema in the left cerebellum. Vascular: No hyperdense vessel. There is calcification in each carotid siphon region. Skull: The bony calvarium appears intact. Sinuses/Orbits: Paranasal sinuses are clear. Orbits appear symmetric bilaterally. Other: Mastoid air cells are clear. IMPRESSION: Suspected vasogenic edema in the inferior to mid left cerebellum with impression on the leftward aspect of the lower pons and medulla as well as impression on the leftward posterior aspect of the fourth ventricle. While this area potentially could represent acute infarct, this appearance is more concerning for potential mass with edema surrounding. This appearance warrants correlation chronic MR pre and post-contrast to further evaluate. If there is a contraindication to MR, CT with intravenous contrast would be warranted in this circumstance. Elsewhere there is patchy periventricular small vessel disease. Decreased attenuation in the posterior right centrum semiovale with extension to the gray-white junction may represent an acute predominantly white matter infarct in this area. No acute hemorrhage.  No extra-axial fluid or midline shift. There are foci of arterial vascular calcification. These results were called by telephone at the time of interpretation on 11/15/2020 at 8:46 am to provider [facility] , who verbally acknowledged these results. Electronically Signed   By: Bretta Bang III M.D.   On: 11/15/2020 08:46   MR ANGIO HEAD WO CONTRAST  Result Date: 11/15/2020 CLINICAL DATA:  Chronic headache. Dizziness. Neurological deficit with suspected stroke. EXAM: MRI HEAD WITHOUT AND WITH CONTRAST MRA  HEAD WITHOUT CONTRAST MRA NECK WITHOUT AND WITH CONTRAST TECHNIQUE: Multiplanar, multiecho pulse sequences of the brain and surrounding structures were obtained without and with intravenous contrast. Angiographic images of the Circle of Willis were obtained using MRA technique without intravenous contrast. Angiographic images of the neck were obtained using MRA technique without and with intravenous contrast. Carotid stenosis measurements (when applicable) are obtained utilizing NASCET criteria, using the distal internal carotid diameter as the denominator. CONTRAST:  76mL GADAVIST GADOBUTROL 1 MMOL/ML IV SOLN COMPARISON:  Head CT same day FINDINGS: MRI HEAD FINDINGS Brain: There is acute infarction affecting the inferior cerebellum on the left. There is swelling and petechial blood products. Small focus of acute infarction in the right lateral cerebellum. No involvement of the actual brainstem. Chronic small-vessel ischemic changes affect the pons. No acute infarction in the supratentorial brain. There are moderate chronic small-vessel ischemic changes  of the cerebral hemispheric white matter. Old right parietal subcortical infarction. No mass, hydrocephalus or extra-axial collection. No abnormal contrast enhancement. Vascular: Major vessels at the base of the brain show flow. Skull and upper cervical spine: Negative Sinuses/Orbits: Clear/normal Other: None MRA HEAD FINDINGS Both internal carotid arteries are patent through the skull base and siphon region. The anterior and middle cerebral vessels are patent without proximal stenosis, aneurysm or vascular malformation. No large or medium vessel anterior circulation branch vessel occlusion. Both vertebral arteries show flow at the foramen magnum. Both vertebral arteries reach the basilar. The basilar artery is small and shows stenosis in the midportion. The superior cerebellar and posterior cerebral arteries show flow. Patent posterior communicating arteries on both  sides. No PICA flow is demonstrated on the left. Right PICA is patent. MRA NECK FINDINGS Both common carotid arteries widely patent to the bifurcation regions. Mild atherosclerotic irregularity at the bifurcations but no stenosis. Cervical internal carotid arteries widely patent. Dominant right vertebral artery is patent through the cervical region. The origin is not well seen because chest motion. 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 Vessel does show flow beyond that to the basilar. IMPRESSION: 1. Acute infarction affecting the inferior cerebellum on the left consistent with complete PICA distribution infarction. Swelling and petechial blood products. Small acute infarction in the lateral cerebellum on the right. Mass-effect upon the fourth ventricle but no obstructive hydrocephalus. 2. Moderate chronic small-vessel ischemic changes elsewhere throughout the brain. 3. No significant anterior circulation pathology on the MRA of the neck or intracranial MR angiography. 4. 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 There is reconstituted flow as a small vessel in the mid cervical region. Right PICA is patent. No flow seen in left PICA. Electronically Signed   By: Paulina Fusi M.D.   On: 11/15/2020 11:19   MR Angiogram Neck W or Wo Contrast  Result Date: 11/15/2020 CLINICAL DATA:  Chronic headache. Dizziness. Neurological deficit with suspected stroke. EXAM: MRI HEAD WITHOUT AND WITH CONTRAST MRA HEAD WITHOUT CONTRAST MRA NECK WITHOUT AND WITH CONTRAST TECHNIQUE: Multiplanar, multiecho pulse sequences of the brain and surrounding structures were obtained without and with intravenous contrast. Angiographic images of the Circle of Willis were obtained using MRA technique without intravenous contrast. Angiographic images of the neck were obtained using MRA technique without and with intravenous  contrast. Carotid stenosis measurements (when applicable) are obtained utilizing NASCET criteria, using the distal internal carotid diameter as the denominator. CONTRAST:  10mL GADAVIST GADOBUTROL 1 MMOL/ML IV SOLN COMPARISON:  Head CT same day FINDINGS: MRI HEAD FINDINGS Brain: There is acute infarction affecting the inferior cerebellum on the left. There is swelling and petechial blood products. Small focus of acute infarction in the right lateral cerebellum. No involvement of the actual brainstem. Chronic small-vessel ischemic changes affect the pons. No acute infarction in the supratentorial brain. There are moderate chronic small-vessel ischemic changes of the cerebral hemispheric white matter. Old right parietal subcortical infarction. No mass, hydrocephalus or extra-axial collection. No abnormal contrast enhancement. Vascular: Major vessels at the base of the brain show flow. Skull and upper cervical spine: Negative Sinuses/Orbits: Clear/normal Other: None MRA HEAD FINDINGS Both internal carotid arteries are patent through the skull base and siphon region. The anterior and middle cerebral vessels are patent without proximal stenosis, aneurysm or vascular malformation. No large or medium vessel anterior circulation branch vessel  occlusion. Both vertebral arteries show flow at the foramen magnum. Both vertebral arteries reach the basilar. The basilar artery is small and shows stenosis in the midportion. The superior cerebellar and posterior cerebral arteries show flow. Patent posterior communicating arteries on both sides. No PICA flow is demonstrated on the left. Right PICA is patent. MRA NECK FINDINGS Both common carotid arteries widely patent to the bifurcation regions. Mild atherosclerotic irregularity at the bifurcations but no stenosis. Cervical internal carotid arteries widely patent. Dominant right vertebral artery is patent through the cervical region. The origin is not well seen because chest motion.  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 Vessel does show flow beyond that to the basilar. IMPRESSION: 1. Acute infarction affecting the inferior cerebellum on the left consistent with complete PICA distribution infarction. Swelling and petechial blood products. Small acute infarction in the lateral cerebellum on the right. Mass-effect upon the fourth ventricle but no obstructive hydrocephalus. 2. Moderate chronic small-vessel ischemic changes elsewhere throughout the brain. 3. No significant anterior circulation pathology on the MRA of the neck or intracranial MR angiography. 4. 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 There is reconstituted flow as a small vessel in the mid cervical region. Right PICA is patent. No flow seen in left PICA. Electronically Signed   By: Paulina FusiMark  Shogry M.D.   On: 11/15/2020 11:19   MR Brain W and Wo Contrast  Result Date: 11/15/2020 CLINICAL DATA:  Chronic headache. Dizziness. Neurological deficit with suspected stroke. EXAM: MRI HEAD WITHOUT AND WITH CONTRAST MRA HEAD WITHOUT CONTRAST MRA NECK WITHOUT AND WITH CONTRAST TECHNIQUE: Multiplanar, multiecho pulse sequences of the brain and surrounding structures were obtained without and with intravenous contrast. Angiographic images of the Circle of Willis were obtained using MRA technique without intravenous contrast. Angiographic images of the neck were obtained using MRA technique without and with intravenous contrast. Carotid stenosis measurements (when applicable) are obtained utilizing NASCET criteria, using the distal internal carotid diameter as the denominator. CONTRAST:  10mL GADAVIST GADOBUTROL 1 MMOL/ML IV SOLN COMPARISON:  Head CT same day FINDINGS: MRI HEAD FINDINGS Brain: There is acute infarction affecting the inferior cerebellum on the left. There is swelling and petechial blood products. Small  focus of acute infarction in the right lateral cerebellum. No involvement of the actual brainstem. Chronic small-vessel ischemic changes affect the pons. No acute infarction in the supratentorial brain. There are moderate chronic small-vessel ischemic changes of the cerebral hemispheric white matter. Old right parietal subcortical infarction. No mass, hydrocephalus or extra-axial collection. No abnormal contrast enhancement. Vascular: Major vessels at the base of the brain show flow. Skull and upper cervical spine: Negative Sinuses/Orbits: Clear/normal Other: None MRA HEAD FINDINGS Both internal carotid arteries are patent through the skull base and siphon region. The anterior and middle cerebral vessels are patent without proximal stenosis, aneurysm or vascular malformation. No large or medium vessel anterior circulation branch vessel occlusion. Both vertebral arteries show flow at the foramen magnum. Both vertebral arteries reach the basilar. The basilar artery is small and shows stenosis in the midportion. The superior cerebellar and posterior cerebral arteries show flow. Patent posterior communicating arteries on both sides. No PICA flow is demonstrated on the left. Right PICA is patent. MRA NECK FINDINGS Both common carotid arteries widely patent to the bifurcation regions. Mild atherosclerotic irregularity at the bifurcations but no stenosis. Cervical internal carotid arteries widely patent.  Dominant right vertebral artery is patent through the cervical region. The origin is not well seen because chest motion. 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 Vessel does show flow beyond that to the basilar. IMPRESSION: 1. Acute infarction affecting the inferior cerebellum on the left consistent with complete PICA distribution infarction. Swelling and petechial blood products. Small acute infarction in the lateral  cerebellum on the right. Mass-effect upon the fourth ventricle but no obstructive hydrocephalus. 2. Moderate chronic small-vessel ischemic changes elsewhere throughout the brain. 3. No significant anterior circulation pathology on the MRA of the neck or intracranial MR angiography. 4. 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 There is reconstituted flow as a small vessel in the mid cervical region. Right PICA is patent. No flow seen in left PICA. Electronically Signed   By: Paulina Fusi M.D.   On: 11/15/2020 11:19    EKG: Independently reviewed. Sinus rhythm, nonspecific changes    Assessment/Plan   1.  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  Findings consistent with acute infarct in the left inferior cerebellum consistent with complete PICA.  Additionally small acute infarct in the lateral right cerebellum -CVA protocol initiated -Out of acute window for [facility] -Neurology consulted in ED, awaiting recommendations -Telemetry -Echocardiogram ordered -Therapies consulted -Started on Lipitor 40 mg daily. Lipid profile ordered -Antiplatelet therapy per neurology   2.  Poorly controlled essential hypertension -Patient unsure of her home blood pressure medication.  But patient was recently restarted on home losartan/HCTZ.  Appears she was on Norvasc 10 mg daily as well. -Allow permissive hypertension in setting of acute CVA -Labetalol PRN for SBP>220 mmHg   3.  Tobacco abuse -Patient current everyday smoker -Recommend cessation -We will hold off on starting nicotine patch given acute CVA   4.  Poorly controlled essential hypertension -Patient unsure of her home blood pressure medication.  But patient was recently restarted on home losartan/HCTZ.  Appears she was on Norvasc 10 mg daily as well. -Allow permissive hypertension in setting of acute  CVA -Labetalol PRN for SBP>220 mmHg  5.  CKD 3A -Patient appears to have chronic renal disease likely in the setting of small vessel disease from poorly controlled hypertension and tobacco abuse -Avoid nephrotoxic agents   6.  Elevated glucose -Hemoglobin A1c ordered -We will monitor closely for now    DVT prophylaxis: SCDs  Code Status: Full code Family Communication: none  Disposition Plan: Anticipate discharge home Consults called: Neurology consulted in the ED Admission status: Inpatient with telemetry     Medical decision making: Patient seen at 12:58 PM on 11/15/2020.  The patient was discussed with [place] PA.  What exists of the patient's chart was reviewed in depth and summarized above.  Clinical condition: Fair.        Laqueta Due [facility] Please page though AMION or Epic secure chat:  For password, contact charge nurse

## 2020-11-15 NOTE — Consult Note (Signed)
Neurology Consultation  CC: Headache and dizziness since 12/17  History is obtained from: patient, she is able to give clear and concise history  HPI: Deborah Mcmahon is a 59 y.o. female with a history significant for essential hypertension, tobacco abuse, and obesity who presented to [facility] ED 12/22 for complaints of gradually worsening frontal headache with nausea and one episode of vomiting since Friday 12/17, dizziness described as room spinning, uncoordinated gait (walks with walker at baseline), and one episode of witnessed slurred speech 12/20. She describes "staggering" gait over the past 2-3 days and also endorses diplopia and drowsiness. She endorses losing her blood pressure medication and being unable to take it for approximately 2 weeks. She attributed her persistent headache to HTN since she was out of her medication prompting her to visit urgent care for her anti-hypertensive medication refill 12/21. She was advised with worsening of headache or persistent symptoms to go to the ER. Her family noticed yesterday evening that she had slurred speech and brought her to the ER for evaluation.  12/22: Neurology was consulted for CT/MRI results revealing an acute left cerebellar infarct with significant vasogenic edema surrounding the infarcted area.   LKW: 12/17 tpa given?: No, outside of window IR Thrombectomy? No, no LVO identified, 6 days s/p insult with significant vasogenic edema. Modified Rankin Scale: 0-Completely asymptomatic and back to baseline post- stroke  NIHSS:  1a Level of Conscious.: 0 1b LOC Questions: 0 1c LOC Commands: 0 2 Best Gaze: 0 3 Visual: 0 4 Facial Palsy: 0 5a Motor Arm - left: 0 5b Motor Arm - Right: 0 6a Motor Leg - Left: 0 6b Motor Leg - Right: 0 7 Limb Ataxia: 1 8 Sensory: 0 9 Best Language: 0 10 Dysarthria: 0 11 Extinct. and Inatten.: 0 TOTAL: 1  ROS: A complete ROS was performed and is negative except as noted in the HPI.   Past Medical  History:  Diagnosis Date  . Carpal tunnel syndrome    bilateral  . Chronic back pain    spondylolisthesis  . Constipation    takes an OTC stool softener daily as needed  . Gallstones   . History of migraine   . Hypertension   . Nocturia   . Weakness    tingling    Family history: Mother: hypertension and type 2 diabetes mellitus  Social History:  reports that she has been smoking. She has a 20.50 pack-year smoking history. She has never used smokeless tobacco. She reports that she does not drink alcohol and does not use drugs.  Current Outpatient Medications  Medication Instructions  . amLODipine (NORVASC) 10 MG tablet amlodipine 10 mg tablet  . DULoxetine (CYMBALTA) 20 MG capsule duloxetine 20 mg capsule,delayed release  . losartan-hydrochlorothiazide (HYZAAR) 100-12.5 MG tablet 1 tablet, Oral, Daily   Current PRN Medications: labetalol  Exam: Current vital signs: BP (!) 216/116   Pulse 95   Temp 98.2 F (36.8 C) (Oral)   Resp (!) 21   Ht 5\' 4"  (1.626 m)   Wt 98 kg   SpO2 99%   BMI 37.08 kg/m    Physical Exam  Constitutional: Appears well-developed and well-nourished.  Psych: Affect appropriate to situation Eyes: No scleral injection HENT: Normocephalic, atraumatic. Dry mm. No OP obstruction Cardiovascular: Normal rate on cardiac monitor Respiratory: Effort normal, non-labored breathing.  GI: Soft.  No distension. There is no tenderness.  Skin: WDI  Neuro: Mental Status: Patient is awake, alert, oriented to person, place, month, year, and situation.  Patient is able to give a clear and coherent history. No signs of aphasia or neglect  Cranial Nerves: II: Visual Fields are full. Pupils are equal, round, and reactive to light, 50mm brisk. III,IV, VI: EOMI without ptosis or diploplia on exam but she does endorse intermittent diplopia since onset of symptoms. Slight horizontal nystagmus noted in forced lateral gaze,. V: Facial sensation is symmetric to light  touch.  VII: Facial movement is symmetric resting and smiling. VIII: Hearing is intact to voice bilaterally X: Uvula elevates symmetrically XI: Shoulder shrug is symmetric. XII: Tongue is midline without atrophy or fasciculations.   Motor: Tone is normal. Bulk is normal. 5/5 strength was present in all four extremities.  Sensory: Sensation is symmetric to light touch and temperature in the arms and legs. Deep Tendon Reflexes: 2+ and symmetric in the biceps, triceps, and patellae.  Plantars: Toes are downgoing bilaterally.  Cerebellar: FNF intact on RUE with slight incoordination with the LUE. HKS are intact bilaterally. Rapid alternating hand movements further reveal incoordination of the left upper extremity.   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   Secondary Diagnosis: Cerebral edema and Hypertension Emergency (SBP > 180 or DBP > 120 & end organ damage)   Impression:  Cerebellar infarction Hypertensive emergency Cocaine use/abuse disorder Cerebral edema  CT head IMPRESSION: Suspected vasogenic edema in the inferior to mid left cerebellum with impression on the leftward aspect of the lower pons and medulla as well as impression on the leftward posterior aspect of the fourth ventricle. While this area potentially could represent acute infarct, this appearance is more concerning for potential mass with edema surrounding. This appearance warrants correlation chronic MR pre and post-contrast to further evaluate. If there is a contraindication to MR, CT with intravenous contrast would be warranted in this circumstance.  Elsewhere there is patchy periventricular small vessel disease. Decreased attenuation in the posterior right centrum semiovale with extension to the  gray-white junction may represent an acute predominantly white matter infarct in this area.  No acute hemorrhage.  No extra-axial fluid or midline shift.  There are foci of arterial vascular calcification.  MRI/MRA brain/neck: IMPRESSION: 1. Acute infarction affecting the inferior cerebellum on the left consistent with complete PICA distribution infarction. Swelling and petechial blood products. Small acute infarction in the lateral cerebellum on the right. Mass-effect upon the fourth ventricle but no obstructive hydrocephalus. 2. Moderate chronic small-vessel ischemic changes elsewhere throughout the brain. 3. No significant anterior circulation pathology on the MRA of the neck or intracranial MR angiography. 4. 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 There is reconstituted flow as a small vessel in the mid cervical region. Right PICA is patent. No flow seen in left PICA.  Recommendations: - Maintain SBP < 160; infarct completed, concern for hemorrhagic conversion  - Labetalol IV PRN, Cardene gtt if necessary - Fluid restriction 1L within the next 24 hours to minimize edema - Repeat CTH at 24 hours - Neuro checks q2h for 24 hours; then q4h   - Echocardiogram - Hemoglobin A1c, fasting lipid panel - Speech therapy, physical therapy, occupational therapy - Hold prophylactic antiplatelet (Aspirin) until 24 hour follow up CT complete - Risk factor modification - Telemetry monitoring - PT consult, OT consult, Speech consult - Stroke team to follow  Assessment and plan discussed with attending provider and they are in agreement. Lanae Boast, AGAC-NP [facility] [phone]  Attestation:  I saw this patient with the APP on 11/15/20, obtained pertinent aspects of the history, and performed relevant physical and neurological examination as documented. Also, I reviewed the available laboratory data and neuroimages, and other relevant  tests/notes/procedures.  My examination findings include left hemiataxia on RAM task, finger opposition, and FNF. NIHSS = 1. No dysarthria. A few beats of nystagmus to each side, but currently denies diplopia.  MRI, MRA and CT reviewed: Left PICA infarction, late acute to early subacute -- there are a few small zones of ADC trace hypointensity. Cerebral edema with mass effect on the 4th ventricle.       Impression: 1) Left PICA Infarct with vertebral occlusion, likely occurring this past Friday (today is day #5) 2) Hypertensive emergency 3) Cerebral edema with mass effect on 4th ventricle 4) Cocaine in UDS  Recommendations: - fluid restriction and elevate HOB - q2h neurochecks - stroke team to follow - prudent BP control ; instead of permissive hypertension, < 160/100 preferred due to mass effect and need to avoid ICH - hold ASA x 24 hours then may begin - repeat CT to reassess mass effect at 24h s/p most recent image (MRI), which would be ~9:40am tomorrow - stat CT for any acute change in neurological status - consider clonidine if BP remains elevated with prn iv labetolol/cardene; may have unopposed alpha in the setting of cocaine intoxication - PT/OT/ST consultations - will likely require therapy - up only with assistance / fall precautions - call with further questions/concerns.  This patient is critically ill at significant risk of neurological worsening, death and care requires constant monitoring of vital signs, hemodynamics,respiratory and cardiac monitoring, neurological assessment, discussion with family, other specialists and medical decision making of high complexity. I spent 50 minutes of neurocritical care time  in the care of  this patient. This was time spent independent of any time provided by nurse practitioner or PA.  Thank you.  Meredeth Ide, MD

## 2020-11-15 NOTE — ED Provider Notes (Signed)
[facility]ARE [facility]ENTER   [id number] 11/13/20 Arrival Time: 1107  ASSESSMENT & PLAN:  1. Nonintractable headache, unspecified chronicity pattern, unspecified headache type   2. Lightheadedness   3. Elevated blood pressure reading in office with diagnosis of hypertension     Discussed hypertensive urgency. BP very elevated here with active headache. She declines ED evaluations. Prefers to fill medicine and take when she gets home. Has BP cuff at home to measure.  Meds ordered this encounter  Medications  . losartan-hydrochlorothiazide (HYZAAR) 100-12.5 MG tablet    Sig: Take 1 tablet by mouth daily.    Dispense:  30 tablet    Refill:  0   Normal neurological exam.  Recommend:  Follow-up Information    [facility] EMERGENCY DEPARTMENT.   Specialty: Emergency Medicine Why: If symptoms worsen in any way or if your blood pressure and headache do not improve after starting back on your blood pressure medicine. Contact information: [address] [id number] mc [place] [address] [phone]               Reviewed expectations re: course of current medical issues. Questions answered. Outlined signs and symptoms indicating need for more acute intervention. Patient verbalized understanding. After Visit Summary given.   SUBJECTIVE: History from: Patient Patient is able to give a clear and coherent history.  Deborah Mcmahon is a 59 y.o. female who presents with complaint of a bad headache. On/off for the past 3-4 days; frontal; throbbing. Occasional lightheaded feeling. Has been out of BP medications and reports BP has been very high, higher than normal. Precipitating factors include: none which have been determined. Mild nausea for a few days; one emesis. Just returned from [place] where she was attending funeral. Worried about COVID. No visual or hearing changes. No extremity sensation changes or weakness. No speech abnormalities. No  head injury. Ambulatory without difficulty.    OBJECTIVE:  Vitals:   11/13/20 1238 11/13/20 1240  BP:  (!) 226/111  Pulse: 90   Resp: 16   Temp:  99 F (37.2 C)  TempSrc:  Oral  SpO2: 95%     General appearance: alert; NAD; appears fatigued HENT: normocephalic; atraumatic Eyes: PERRLA; EOMI; conjunctivae normal Neck: supple with FROM Lungs: clear to auscultation bilaterally; unlabored respirations Heart: regular Extremities: no edema; symmetrical with no gross deformities Skin: warm and dry Neurologic: alert; speech is fluent and clear without dysarthria or aphasia; CN 2-12 grossly intact; no facial droop; normal gait; normal symmetric reflexes; normal extremity strength and sensation throughout   Labs:  Labs Reviewed  POCT URINALYSIS DIPSTICK, ED / UC - Abnormal; Notable for the following components:      Result Value   Hgb urine dipstick TRACE (*)    All other components within normal limits  SARS CORONAVIRUS 2 (TAT 6-24 HRS)    Allergies  Allergen Reactions  . Hydrocodone-Acetaminophen   . Hydrocodone Other (See Comments)    itching    Past Medical History:  Diagnosis Date  . Carpal tunnel syndrome    bilateral  . Chronic back pain    spondylolisthesis  . Constipation    takes an OTC stool softener daily as needed  . Gallstones   . History of migraine   . Hypertension   . Nocturia   . Weakness    tingling    Social History   Socioeconomic History  . Marital status: Single    Spouse name: Not on file  . Number of children: Not  on file  . Years of education: Not on file  . Highest education level: Not on file  Occupational History  . Not on file  Tobacco Use  . Smoking status: Current Every Day Smoker    Packs/day: 0.50    Years: 41.00    Pack years: 20.50  . Smokeless tobacco: Never Used  Substance and Sexual Activity  . Alcohol use: No  . Drug use: No  . Sexual activity: Yes    Birth control/protection: Post-menopausal  Other Topics  Concern  . Not on file  Social History Narrative  . Not on file   Social Determinants of Health   Financial Resource Strain: Not on file  Food Insecurity: Not on file  Transportation Needs: Not on file  Physical Activity: Not on file  Stress: Not on file  Social Connections: Not on file  Intimate Partner Violence: Not on file   History reviewed. No pertinent family history. Past Surgical History:  Procedure Laterality Date  . BACK SURGERY     fusion  . CERVICAL SPINE SURGERY     x 4-fusion  . COLONOSCOPY    . MYRINGOTOMY    . right knee surgery     screws  . TONSILLECTOMY AND ADENOIDECTOMY    . TUBAL LIGATION       Mardella Layman, MD 11/15/20 [phone]

## 2020-11-15 NOTE — ED Notes (Signed)
Pt placed on bedpan

## 2020-11-15 NOTE — ED Notes (Signed)
Patient transported to MRI 

## 2020-11-15 NOTE — ED Notes (Signed)
Pt back from MRI 

## 2020-11-15 NOTE — ED Provider Notes (Signed)
[facility] EMERGENCY DEPARTMENT Provider Note   CSN: [id number] Arrival date & time: 11/14/20  2350     History Chief Complaint  Patient presents with  . Dizziness  . Hypertension    Deborah Mcmahon is a 59 y.o. female.  59 y.o female with a a PMH of HTN presents to the ED with a chief complaint of HTN, Migraine presents to the ED with a chief complaint of dizziness and headache x 2 weeks. According to patient, she stopped taking her BP medications for about 2 weeks and resume taking these yesterday. Described the headache as gradual onset Saturday, associated with nausea and an episode of vomiting. Also endorses dizziness, describes this as the "room moving in circles". Has taken BP meds for improvement in symptoms. No alleviating factors. Aside from sitting up makes her head feel better. Patient also vaguely reports left arm numbness along with slurred speech which began on Friday. No fever, no neck pain, no prior hx of aneurysm.       The history is provided by the patient.  Headache Pain location:  Frontal and occipital Radiates to:  Does not radiate Severity currently:  9/10 Severity at highest:  10/10 Onset quality:  Gradual Duration:  4 days Timing:  Constant Progression:  Worsening Chronicity:  New Relieved by:  Nothing Worsened by:  Nothing Associated symptoms: nausea and vomiting   Associated symptoms: no abdominal pain, no blurred vision, no fever, no neck pain, no photophobia and no weakness   Risk factors: no family hx of SAH        Past Medical History:  Diagnosis Date  . Carpal tunnel syndrome    bilateral  . Chronic back pain    spondylolisthesis  . Constipation    takes an OTC stool softener daily as needed  . Gallstones   . History of migraine   . Hypertension   . Nocturia   . Weakness    tingling     Patient Active Problem List   Diagnosis Date Noted  . CVA (cerebral vascular accident) (HCC) 11/15/2020  . Benign  hypertension 12/10/2018    Past Surgical History:  Procedure Laterality Date  . BACK SURGERY     fusion  . CERVICAL SPINE SURGERY     x 4-fusion  . COLONOSCOPY    . MYRINGOTOMY    . right knee surgery     screws  . TONSILLECTOMY AND ADENOIDECTOMY    . TUBAL LIGATION       OB History   No obstetric history on file.     No family history on file.  Social History   Tobacco Use  . Smoking status: Current Every Day Smoker    Packs/day: 0.50    Years: 41.00    Pack years: 20.50  . Smokeless tobacco: Never Used  Substance Use Topics  . Alcohol use: No  . Drug use: No    Home Medications Prior to Admission medications   Medication Sig Start Date End Date Taking? Authorizing Provider  amLODipine (NORVASC) 10 MG tablet amlodipine 10 mg tablet    [provider]  losartan-hydrochlorothiazide (HYZAAR) 100-12.5 MG tablet Take 1 tablet by mouth daily. 11/13/20   Mardella Layman, MD  DULoxetine HCl 20 MG CSDR duloxetine 20 mg capsule,delayed release  11/13/20  [provider]    Allergies    Hydrocodone-acetaminophen and Hydrocodone  Review of Systems   Review of Systems  Constitutional: Negative for fever.  Eyes: Negative for blurred  vision and photophobia.  Respiratory: Negative for shortness of breath.   Cardiovascular: Negative for chest pain.  Gastrointestinal: Positive for nausea and vomiting. Negative for abdominal pain.  Genitourinary: Negative for flank pain.  Musculoskeletal: Negative for neck pain.  Skin: Negative for pallor and wound.  Neurological: Positive for headaches. Negative for weakness.  All other systems reviewed and are negative.   Physical Exam Updated Vital Signs BP (!) 196/125   Pulse 91   Temp 98.2 F (36.8 C) (Oral)   Resp 19   Ht 5\' 4"  (1.626 m)   Wt 98 kg   SpO2 97%   BMI 37.08 kg/m   Physical Exam Vitals and nursing note reviewed.  Constitutional:      Appearance: Normal appearance. She is obese. She is  ill-appearing.  HENT:     Head: Normocephalic and atraumatic.     Mouth/Throat:     Mouth: Mucous membranes are moist.  Cardiovascular:     Rate and Rhythm: Normal rate.  Pulmonary:     Effort: Pulmonary effort is normal.     Breath sounds: No wheezing.  Abdominal:     General: Abdomen is flat.  Musculoskeletal:     Cervical back: Normal range of motion and neck supple. No rigidity or tenderness.  Skin:    General: Skin is warm and dry.  Neurological:     Mental Status: She is alert and oriented to person, place, and time.     Comments:  Alert, oriented, thought content appropriate. Speech delayed without evidence of aphasia. Able to follow 2 step commands without difficulty.  Cranial Nerves:  II:  Peripheral visual fields grossly normal, pupils, round, reactive to light III,IV, VI: ptosis not present, extra-ocular motions intact bilaterally  V,VII: smile symmetric, facial light touch sensation equal VIII: hearing grossly normal bilaterally  IX,X: midline uvula rise  XI: bilateral shoulder shrug equal and strong XII: midline tongue extension  Motor:  4/5 left arm with grip strength 5/5  lower extremities bilaterally including strong and equal grip strength and dorsiflexion/plantar flexion Sensory: light touch normal in all extremities.  Cerebellar: normal finger-to-nose with bilateral upper extremities, pronator drift negative Gait: not tested       ED Results / Procedures / Treatments   Labs (all labs ordered are listed, but only abnormal results are displayed) Labs Reviewed  CBC WITH DIFFERENTIAL/PLATELET - Abnormal; Notable for the following components:      Result Value   RBC 5.12 (*)    RDW 15.9 (*)    All other components within normal limits  COMPREHENSIVE METABOLIC PANEL - Abnormal; Notable for the following components:   Glucose, Bld 116 (*)    Creatinine, Ser 1.21 (*)    Albumin 3.1 (*)    GFR, Estimated 51 (*)    All other components within normal  limits  RESP PANEL BY RT-PCR (FLU A&B, COVID) [id number]  TROPONIN I (HIGH SENSITIVITY)  TROPONIN I (HIGH SENSITIVITY)    EKG EKG Interpretation  Date/Time:  Wednesday November 15 2020 07:40:04 EST Ventricular Rate:  85 PR Interval:    QRS Duration: 92 QT Interval:  382 QTC Calculation: 455 R Axis:   -61 Text Interpretation: Sinus rhythm Probable left atrial enlargement Left anterior fascicular block Anterior infarct, old ST elevation, consider inferior injury without reciprocal changes Since last tracing Non-specific ST-t changes Confirmed by Susy FrizzleSheldon, Charles [phone]([id number]) on 11/15/2020 8:15:23 AM   Radiology CT Head Wo Contrast  Result Date: 11/15/2020 CLINICAL DATA:  Dizziness EXAM: CT HEAD  WITHOUT CONTRAST TECHNIQUE: Contiguous axial images were obtained from the base of the skull through the vertex without intravenous contrast. COMPARISON:  None. FINDINGS: Brain: Ventricles and sulci overall are normal in size and configuration. There is vasogenic edema throughout much of the mid to lower left cerebellar hemisphere which impresses upon the posterior leftward aspect of the fourth ventricle as well as impressing upon the leftward aspect of the lower pons and medulla. Elsewhere, there is patchy small vessel disease in the centra semiovale bilaterally. There is decreased attenuation in the posterior right centrum semiovale extending to the gray-white junction which may represent a recent infarct in this area. There is no hemorrhage, extra-axial fluid collection, midline shift. A well-defined mass is not seen, although there is concern for mass with surrounding edema in the left cerebellum. Vascular: No hyperdense vessel. There is calcification in each carotid siphon region. Skull: The bony calvarium appears intact. Sinuses/Orbits: Paranasal sinuses are clear. Orbits appear symmetric bilaterally. Other: Mastoid air cells are clear. IMPRESSION: Suspected vasogenic edema in the inferior to mid left  cerebellum with impression on the leftward aspect of the lower pons and medulla as well as impression on the leftward posterior aspect of the fourth ventricle. While this area potentially could represent acute infarct, this appearance is more concerning for potential mass with edema surrounding. This appearance warrants correlation chronic MR pre and post-contrast to further evaluate. If there is a contraindication to MR, CT with intravenous contrast would be warranted in this circumstance. Elsewhere there is patchy periventricular small vessel disease. Decreased attenuation in the posterior right centrum semiovale with extension to the gray-white junction may represent an acute predominantly white matter infarct in this area. No acute hemorrhage.  No extra-axial fluid or midline shift. There are foci of arterial vascular calcification. These results were called by telephone at the time of interpretation on 11/15/2020 at 8:46 am to provider [facility] , who verbally acknowledged these results. Electronically Signed   By: Bretta Bang III M.D.   On: 11/15/2020 08:46   MR ANGIO HEAD WO CONTRAST  Result Date: 11/15/2020 CLINICAL DATA:  Chronic headache. Dizziness. Neurological deficit with suspected stroke. EXAM: MRI HEAD WITHOUT AND WITH CONTRAST MRA HEAD WITHOUT CONTRAST MRA NECK WITHOUT AND WITH CONTRAST TECHNIQUE: Multiplanar, multiecho pulse sequences of the brain and surrounding structures were obtained without and with intravenous contrast. Angiographic images of the Circle of Willis were obtained using MRA technique without intravenous contrast. Angiographic images of the neck were obtained using MRA technique without and with intravenous contrast. Carotid stenosis measurements (when applicable) are obtained utilizing NASCET criteria, using the distal internal carotid diameter as the denominator. CONTRAST:  10mL GADAVIST GADOBUTROL 1 MMOL/ML IV SOLN COMPARISON:  Head CT same day FINDINGS: MRI HEAD  FINDINGS Brain: There is acute infarction affecting the inferior cerebellum on the left. There is swelling and petechial blood products. Small focus of acute infarction in the right lateral cerebellum. No involvement of the actual brainstem. Chronic small-vessel ischemic changes affect the pons. No acute infarction in the supratentorial brain. There are moderate chronic small-vessel ischemic changes of the cerebral hemispheric white matter. Old right parietal subcortical infarction. No mass, hydrocephalus or extra-axial collection. No abnormal contrast enhancement. Vascular: Major vessels at the base of the brain show flow. Skull and upper cervical spine: Negative Sinuses/Orbits: Clear/normal Other: None MRA HEAD FINDINGS Both internal carotid arteries are patent through the skull base and siphon region. The anterior and middle cerebral vessels are patent without proximal stenosis, aneurysm or  vascular malformation. No large or medium vessel anterior circulation branch vessel occlusion. Both vertebral arteries show flow at the foramen magnum. Both vertebral arteries reach the basilar. The basilar artery is small and shows stenosis in the midportion. The superior cerebellar and posterior cerebral arteries show flow. Patent posterior communicating arteries on both sides. No PICA flow is demonstrated on the left. Right PICA is patent. MRA NECK FINDINGS Both common carotid arteries widely patent to the bifurcation regions. Mild atherosclerotic irregularity at the bifurcations but no stenosis. Cervical internal carotid arteries widely patent. Dominant right vertebral artery is patent through the cervical region. The origin is not well seen because chest motion. 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 Vessel does show flow beyond that to the basilar. IMPRESSION: 1. Acute infarction affecting the inferior cerebellum on the  left consistent with complete PICA distribution infarction. Swelling and petechial blood products. Small acute infarction in the lateral cerebellum on the right. Mass-effect upon the fourth ventricle but no obstructive hydrocephalus. 2. Moderate chronic small-vessel ischemic changes elsewhere throughout the brain. 3. No significant anterior circulation pathology on the MRA of the neck or intracranial MR angiography. 4. 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 There is reconstituted flow as a small vessel in the mid cervical region. Right PICA is patent. No flow seen in left PICA. Electronically Signed   By: Paulina Fusi M.D.   On: 11/15/2020 11:19   MR Angiogram Neck W or Wo Contrast  Result Date: 11/15/2020 CLINICAL DATA:  Chronic headache. Dizziness. Neurological deficit with suspected stroke. EXAM: MRI HEAD WITHOUT AND WITH CONTRAST MRA HEAD WITHOUT CONTRAST MRA NECK WITHOUT AND WITH CONTRAST TECHNIQUE: Multiplanar, multiecho pulse sequences of the brain and surrounding structures were obtained without and with intravenous contrast. Angiographic images of the Circle of Willis were obtained using MRA technique without intravenous contrast. Angiographic images of the neck were obtained using MRA technique without and with intravenous contrast. Carotid stenosis measurements (when applicable) are obtained utilizing NASCET criteria, using the distal internal carotid diameter as the denominator. CONTRAST:  53mL GADAVIST GADOBUTROL 1 MMOL/ML IV SOLN COMPARISON:  Head CT same day FINDINGS: MRI HEAD FINDINGS Brain: There is acute infarction affecting the inferior cerebellum on the left. There is swelling and petechial blood products. Small focus of acute infarction in the right lateral cerebellum. No involvement of the actual brainstem. Chronic small-vessel ischemic changes affect the pons. No acute infarction in the supratentorial brain. There are moderate chronic small-vessel ischemic  changes of the cerebral hemispheric white matter. Old right parietal subcortical infarction. No mass, hydrocephalus or extra-axial collection. No abnormal contrast enhancement. Vascular: Major vessels at the base of the brain show flow. Skull and upper cervical spine: Negative Sinuses/Orbits: Clear/normal Other: None MRA HEAD FINDINGS Both internal carotid arteries are patent through the skull base and siphon region. The anterior and middle cerebral vessels are patent without proximal stenosis, aneurysm or vascular malformation. No large or medium vessel anterior circulation branch vessel occlusion. Both vertebral arteries show flow at the foramen magnum. Both vertebral arteries reach the basilar. The basilar artery is small and shows stenosis in the midportion. The superior cerebellar and posterior cerebral arteries show flow. Patent posterior communicating arteries on both sides. No PICA flow is demonstrated on the left. Right PICA is patent. MRA NECK FINDINGS Both common carotid arteries widely patent to the bifurcation regions. Mild atherosclerotic irregularity  at the bifurcations but no stenosis. Cervical internal carotid arteries widely patent. Dominant right vertebral artery is patent through the cervical region. The origin is not well seen because chest motion. 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 Vessel does show flow beyond that to the basilar. IMPRESSION: 1. Acute infarction affecting the inferior cerebellum on the left consistent with complete PICA distribution infarction. Swelling and petechial blood products. Small acute infarction in the lateral cerebellum on the right. Mass-effect upon the fourth ventricle but no obstructive hydrocephalus. 2. Moderate chronic small-vessel ischemic changes elsewhere throughout the brain. 3. No significant anterior circulation pathology on the MRA of the neck or  intracranial MR angiography. 4. 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 There is reconstituted flow as a small vessel in the mid cervical region. Right PICA is patent. No flow seen in left PICA. Electronically Signed   By: Paulina Fusi M.D.   On: 11/15/2020 11:19   MR Brain W and Wo Contrast  Result Date: 11/15/2020 CLINICAL DATA:  Chronic headache. Dizziness. Neurological deficit with suspected stroke. EXAM: MRI HEAD WITHOUT AND WITH CONTRAST MRA HEAD WITHOUT CONTRAST MRA NECK WITHOUT AND WITH CONTRAST TECHNIQUE: Multiplanar, multiecho pulse sequences of the brain and surrounding structures were obtained without and with intravenous contrast. Angiographic images of the Circle of Willis were obtained using MRA technique without intravenous contrast. Angiographic images of the neck were obtained using MRA technique without and with intravenous contrast. Carotid stenosis measurements (when applicable) are obtained utilizing NASCET criteria, using the distal internal carotid diameter as the denominator. CONTRAST:  10mL GADAVIST GADOBUTROL 1 MMOL/ML IV SOLN COMPARISON:  Head CT same day FINDINGS: MRI HEAD FINDINGS Brain: There is acute infarction affecting the inferior cerebellum on the left. There is swelling and petechial blood products. Small focus of acute infarction in the right lateral cerebellum. No involvement of the actual brainstem. Chronic small-vessel ischemic changes affect the pons. No acute infarction in the supratentorial brain. There are moderate chronic small-vessel ischemic changes of the cerebral hemispheric white matter. Old right parietal subcortical infarction. No mass, hydrocephalus or extra-axial collection. No abnormal contrast enhancement. Vascular: Major vessels at the base of the brain show flow. Skull and upper cervical spine: Negative Sinuses/Orbits: Clear/normal Other: None MRA HEAD FINDINGS Both internal carotid arteries are patent through the skull base  and siphon region. The anterior and middle cerebral vessels are patent without proximal stenosis, aneurysm or vascular malformation. No large or medium vessel anterior circulation branch vessel occlusion. Both vertebral arteries show flow at the foramen magnum. Both vertebral arteries reach the basilar. The basilar artery is small and shows stenosis in the midportion. The superior cerebellar and posterior cerebral arteries show flow. Patent posterior communicating arteries on both sides. No PICA flow is demonstrated on the left. Right PICA is patent. MRA NECK FINDINGS Both common carotid arteries widely patent to the bifurcation regions. Mild atherosclerotic irregularity at the bifurcations but no stenosis. Cervical internal carotid arteries widely patent. Dominant right vertebral artery is patent through the cervical region. The origin is not well seen because chest motion. 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 Vessel does show flow beyond that to the basilar. IMPRESSION: 1. Acute infarction affecting the inferior cerebellum on the left consistent with complete PICA distribution infarction. Swelling and  petechial blood products. Small acute infarction in the lateral cerebellum on the right. Mass-effect upon the fourth ventricle but no obstructive hydrocephalus. 2. Moderate chronic small-vessel ischemic changes elsewhere throughout the brain. 3. No significant anterior circulation pathology on the MRA of the neck or intracranial MR angiography. 4. 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 There is reconstituted flow as a small vessel in the mid cervical region. Right PICA is patent. No flow seen in left PICA. Electronically Signed   By: Paulina Fusi M.D.   On: 11/15/2020 11:19    Procedures Procedures (including critical care time)  Medications Ordered in ED Medications  gadobutrol  (GADAVIST) 1 MMOL/ML injection 10 mL (10 mLs Intravenous Contrast Given 11/15/20 1107)    ED Course  I have reviewed the triage vital signs and the nursing notes.  Pertinent labs & imaging results that were available during my care of the patient were reviewed by me and considered in my medical decision making (see chart for details).    MDM Rules/Calculators/A&P                         Patient here with a chief complaint of dizziness, headache for the past 2 weeks, symptoms worsened 2 days ago.  Attempted to be seen at urgent care but was unsuccessful.  Reports she has been out of blood pressure medications for 2 weeks, began taking them yesterday.  No blurry vision, vomiting but does endorse some nausea.  Describes dizziness as the room spinning in circles.  Patient's vitals are remarkable for a systolic blood pressure in the 200 range, diastolic in the mid 100s.  Rate has remained between the 80s and 90s.  She endorses the headache is worse with laying flat but better with sitting up.  Neurological wise left arm with slightly weaker arm strength.  Gait was not evaluated as patient was transported via wheelchair.  Speech is somewhat delayed however not slurred.  Sensation is intact throughout.Interpretation of her labs with no leukocytosis, no neck rigidity no meningeal signs, lower suspicion for meningitis. CMP with no electrolyte derangement, creatine level is within her baseline. Patient did resume her blood pressure medications yesterday morning.  CT Head w/o contrast:  Suspected vasogenic edema in the inferior to mid left cerebellum  with impression on the leftward aspect of the lower pons and medulla  as well as impression on the leftward posterior aspect of the fourth  ventricle. While this area potentially could represent acute  infarct, this appearance is more concerning for potential mass with  edema surrounding. This appearance warrants correlation chronic MR  pre and  post-contrast to further evaluate. If there is a  contraindication to MR, CT with intravenous contrast would be  warranted in this circumstance.    Elsewhere there is patchy periventricular small vessel disease.  Decreased attenuation in the posterior right centrum semiovale with  extension to the gray-white junction may represent an acute  predominantly white matter infarct in this area.    No acute hemorrhage. No extra-axial fluid or midline shift.    There are foci of arterial vascular calcification.    These results were called by telephone at the time of interpretation  on 11/15/2020 at 8:46 am to provider [facility] , who verbally  acknowledged these results.    8:46 AM call received from radiologist, after reviewing CT imaging, some suspicion for acute stroke versus potential mass.  Recommend that MRI brain with  and without for further evaluation.  MRA/MRI of the brain showed:  1. Acute infarction affecting the inferior cerebellum on the left  consistent with complete PICA distribution infarction. Swelling and  petechial blood products. Small acute infarction in the lateral  cerebellum on the right. Mass-effect upon the fourth ventricle but  no obstructive hydrocephalus.  2. Moderate chronic small-vessel ischemic changes elsewhere  throughout the brain.  3. No significant anterior circulation pathology on the MRA of the  neck or intracranial MR angiography.  4. 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  There is reconstituted flow as a small vessel in the mid cervical  region. Right PICA is patent. No flow seen in left PICA.   Patient and her daughter at the bedside Dois Davenport informed of results of MRI/MRA, will place consult for neurology recommendations along with hospitalist admission at this time.  12:30 PM Spoke to neurology on-call who has agreed to consult on patient pending hospitalist admission for blood pressure control.  12:36 PM  Spoke to hospitalist service who agrees with admission.    Portions of this note were generated with Scientist, clinical (histocompatibility and immunogenetics). Dictation errors may occur despite best attempts at proofreading.   Final Clinical Impression(s) / ED Diagnoses Final diagnoses:  Cerebrovascular accident (CVA), unspecified mechanism (HCC)  Bad headache    Rx / DC Orders ED Discharge Orders    None       Claude Manges, Cordelia Poche 11/15/20 1240    Pollyann Savoy, MD 11/15/20 1340

## 2020-11-15 NOTE — ED Notes (Signed)
Admitting at bedside 

## 2020-11-15 NOTE — Progress Notes (Signed)
*  PRELIMINARY RESULTS* Echocardiogram 2D Echocardiogram has been performed.  Deborah Mcmahon 11/15/2020, 2:51 PM

## 2020-11-15 NOTE — ED Notes (Signed)
Patient transported to CT 

## 2020-11-15 NOTE — ED Notes (Signed)
Pt endorses dizziness since Friday and HA. States she has been out of her BP meds for 2 weeks.

## 2020-11-15 NOTE — ED Notes (Signed)
Patients daughter requesting ginger ale for patient. Explained she would have to wait to be seen by a provider before she could eat/drink.

## 2020-11-16 ENCOUNTER — Other Ambulatory Visit (HOSPITAL_COMMUNITY): Payer: Medicare Other

## 2020-11-16 ENCOUNTER — Inpatient Hospital Stay (HOSPITAL_COMMUNITY): Payer: Medicare Other

## 2020-11-16 DIAGNOSIS — F1990 Other psychoactive substance use, unspecified, uncomplicated: Secondary | ICD-10-CM

## 2020-11-16 DIAGNOSIS — I639 Cerebral infarction, unspecified: Secondary | ICD-10-CM

## 2020-11-16 DIAGNOSIS — I6300 Cerebral infarction due to thrombosis of unspecified precerebral artery: Secondary | ICD-10-CM

## 2020-11-16 LAB — CBC WITH DIFFERENTIAL/PLATELET
Abs Immature Granulocytes: 0.04 10*3/uL (ref 0.00–0.07)
Basophils Absolute: 0 10*3/uL (ref 0.0–0.1)
Basophils Relative: 0 %
Eosinophils Absolute: 0.1 10*3/uL (ref 0.0–0.5)
Eosinophils Relative: 1 %
HCT: 44.2 % (ref 36.0–46.0)
Hemoglobin: 14.2 g/dL (ref 12.0–15.0)
Immature Granulocytes: 1 %
Lymphocytes Relative: 24 %
Lymphs Abs: 2 10*3/uL (ref 0.7–4.0)
MCH: 27.6 pg (ref 26.0–34.0)
MCHC: 32.1 g/dL (ref 30.0–36.0)
MCV: 85.8 fL (ref 80.0–100.0)
Monocytes Absolute: 0.8 10*3/uL (ref 0.1–1.0)
Monocytes Relative: 10 %
Neutro Abs: 5.4 10*3/uL (ref 1.7–7.7)
Neutrophils Relative %: 64 %
Platelets: 235 10*3/uL (ref 150–400)
RBC: 5.15 MIL/uL — ABNORMAL HIGH (ref 3.87–5.11)
RDW: 15.9 % — ABNORMAL HIGH (ref 11.5–15.5)
WBC: 8.4 10*3/uL (ref 4.0–10.5)
nRBC: 0 % (ref 0.0–0.2)

## 2020-11-16 LAB — BASIC METABOLIC PANEL
Anion gap: 12 (ref 5–15)
BUN: 18 mg/dL (ref 6–20)
CO2: 23 mmol/L (ref 22–32)
Calcium: 9.5 mg/dL (ref 8.9–10.3)
Chloride: 102 mmol/L (ref 98–111)
Creatinine, Ser: 1.1 mg/dL — ABNORMAL HIGH (ref 0.44–1.00)
GFR, Estimated: 58 mL/min — ABNORMAL LOW (ref >60)
Glucose, Bld: 123 mg/dL — ABNORMAL HIGH (ref 70–99)
Potassium: 3.7 mmol/L (ref 3.5–5.1)
Sodium: 137 mmol/L (ref 135–145)

## 2020-11-16 LAB — PHOSPHORUS: Phosphorus: 4.4 mg/dL (ref 2.5–4.6)

## 2020-11-16 LAB — LIPID PANEL
Cholesterol: 177 mg/dL (ref 0–200)
HDL: 34 mg/dL — ABNORMAL LOW (ref >40)
LDL Cholesterol: 119 mg/dL — ABNORMAL HIGH (ref 0–99)
Total CHOL/HDL Ratio: 5.2 RATIO
Triglycerides: 119 mg/dL (ref <150)
VLDL: 24 mg/dL (ref 0–40)

## 2020-11-16 LAB — HEMOGLOBIN A1C
Hgb A1c MFr Bld: 5.5 % (ref 4.8–5.6)
Mean Plasma Glucose: 111.15 mg/dL

## 2020-11-16 LAB — MAGNESIUM: Magnesium: 2 mg/dL (ref 1.7–2.4)

## 2020-11-16 MED ORDER — ASPIRIN EC 81 MG PO TBEC
81.0000 mg | DELAYED_RELEASE_TABLET | Freq: Every day | ORAL | Status: DC
  Administered 2020-11-16 – 2020-11-17 (×2): 81 mg via ORAL
  Filled 2020-11-16 (×2): qty 1

## 2020-11-16 MED ORDER — ENOXAPARIN SODIUM 40 MG/0.4ML ~~LOC~~ SOLN
40.0000 mg | SUBCUTANEOUS | Status: DC
  Administered 2020-11-16: 17:00:00 40 mg via SUBCUTANEOUS
  Filled 2020-11-16: qty 0.4

## 2020-11-16 MED ORDER — CLOPIDOGREL BISULFATE 75 MG PO TABS
75.0000 mg | ORAL_TABLET | Freq: Every day | ORAL | Status: DC
  Administered 2020-11-16 – 2020-11-17 (×2): 75 mg via ORAL
  Filled 2020-11-16 (×2): qty 1

## 2020-11-16 MED ORDER — LABETALOL HCL 5 MG/ML IV SOLN: 10.0000 mg | INTRAVENOUS | Status: DC | PRN

## 2020-11-16 NOTE — Assessment & Plan Note (Addendum)
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

## 2020-11-16 NOTE — Evaluation (Signed)
Speech Language Pathology Evaluation Patient Details Name: Deborah Mcmahon MRN: [id number] DOB: 1961/11/19 Today's Date: 11/16/2020 Time: 6295-2841 SLP Time Calculation (min) (ACUTE ONLY): 28 min  Problem List:  Patient Active Problem List   Diagnosis Date Noted  . CVA (cerebral vascular accident) (HCC) 11/15/2020  . Tobacco abuse 11/15/2020  . CKD (chronic kidney disease), stage III (HCC) 11/15/2020  . Essential hypertension 12/10/2018   Past Medical History:  Past Medical History:  Diagnosis Date  . Carpal tunnel syndrome    bilateral  . Chronic back pain    spondylolisthesis  . Constipation    takes an OTC stool softener daily as needed  . Gallstones   . History of migraine   . Hypertension   . Nocturia   . Weakness    tingling    Past Surgical History:  Past Surgical History:  Procedure Laterality Date  . BACK SURGERY     fusion  . CERVICAL SPINE SURGERY     x 4-fusion  . COLONOSCOPY    . MYRINGOTOMY    . right knee surgery     screws  . TONSILLECTOMY AND ADENOIDECTOMY    . TUBAL LIGATION     HPI:  59 y.o. female with hx of tobacco abuse, obesity, essential hypertension, who presented to the hospital on 11/15/2020 secondary to headache, dizziness, and elevated blood pressure at home CT head on 11/16/20 indicated Evolving acute large left cerebellar infarction. Similar mass effect. No hydrocephalus or hemorrhage  Assessment / Plan / Recommendation Clinical Impression  Pt administered SLUMS assessment ([employer] Mental Status Examination) with a score of 28/30 obtained (normal score being 27/30 or above).  Pt able to complete attention tasks, recall objects from memory after a time delay, clock formation accurate with correct time, answer auditory comprehension questions from a simple paragraph, perform simple math calculation and repeat 2-3 digits backwards.  She was oriented x4 and speech noted to be 100% intelligible within complex conversation.  No  ST recommended in acute setting d/t pt performance on this assessment.  ST will s/o in this venue.  Thank you for this consult.  If pt had a change in function, please re-consult ST as CT head on 11/16/20 exhibited an evolving left cerebellar infarction.    SLP Assessment  SLP Recommendation/Assessment: Patient does not need any further Speech Language Pathology Services SLP Visit Diagnosis: Other (comment)    Follow Up Recommendations  None    Frequency and Duration     Evaluation only      SLP Evaluation Cognition  Overall Cognitive Status: Within Functional Limits for tasks assessed Orientation Level: Oriented X4 Attention: Sustained Sustained Attention: Appears intact Memory: Appears intact Immediate Memory Recall: Sock;Blue;Bed Memory Recall Sock: Without Cue Memory Recall Blue: Without Cue Memory Recall Bed: Without Cue Awareness: Appears intact Problem Solving: Appears intact Safety/Judgment: Appears intact       Comprehension  Auditory Comprehension Overall Auditory Comprehension: Appears within functional limits for tasks assessed Yes/No Questions: Within Functional Limits Commands: Within Functional Limits Conversation: Complex Visual Recognition/Discrimination Discrimination: Within Function Limits Reading Comprehension Reading Status: Within funtional limits    Expression Expression Primary Mode of Expression: Verbal Verbal Expression Overall Verbal Expression: Appears within functional limits for tasks assessed Initiation: No impairment Level of Generative/Spontaneous Verbalization: Conversation Repetition: No impairment Naming: No impairment Pragmatics: No impairment Non-Verbal Means of Communication: Not applicable Written Expression Dominant Hand: Right Written Expression: Within Functional Limits   Oral / Motor  Oral Motor/Sensory Function Overall Oral  Motor/Sensory Function: Within functional limits Motor Speech Overall Motor Speech: Appears  within functional limits for tasks assessed Respiration: Within functional limits Phonation: Normal Resonance: Within functional limits Articulation: Within functional limitis Intelligibility: Intelligible Motor Planning: Witnin functional limits Motor Speech Errors: Not applicable                       Tressie Stalker, M.S., CCC-SLP 11/16/2020, 11:50 AM

## 2020-11-16 NOTE — Assessment & Plan Note (Signed)
-  Smoking cessation recommended -Continue patch as needed

## 2020-11-16 NOTE — Progress Notes (Signed)
PROGRESS NOTE    AIRYONNA FRANKLYN   YPP:[id number]  DOB: Jun 17, 1961  DOA: 11/14/2020     1  PCP: Patient, No Pcp Per  CC: left side weakness  Hospital Course: ZALEY TALLEY is a 59 y.o. female with hx of tobacco abuse, obesity, essential hypertension, who presented to the hospital on 11/15/2020 secondary to headache, dizziness, and elevated blood pressure at home.  Patient was evaluated in the ED 2 days PTA, 11/13/2020, for persistent throbbing headache.  At that time her blood pressure was gently elevated, 220/110, and she had been out of her blood pressure medications for over 2 weeks.  She was prescribed losartan/HCTZ, declined ED evaluation and discharged home.   On initial presentation to the ED, she says she has been dizzy for the past 5 days, not feeling well.  She has had associated dizziness and nausea.  Apparently she was seen by urgent care yesterday and advised that if her symptoms persisted, to be seen in the ED.   Interval History:  Overall feeling better when seen this morning.  States that her headache was resolved and was denying any further weakness.  Old records reviewed in assessment of this patient  ROS: Constitutional: negative for chills and fevers, Respiratory: negative for cough, Cardiovascular: negative for chest pain and Gastrointestinal: negative for abdominal pain  Assessment & Plan: * CVA (cerebral vascular accident) (HCC) - acute left cerebellar infarct seen on CT; repeat CT am of 12/23 reveals ongoing evolving infarct; no hemorrhagic transformation  - neuro following, appreciate assistance -Continue statin -Follow-up antiplatelet recommendations from neurology -Continue neurochecks -Goal blood pressure less than 160/100  Substance use -UDS positive for cocaine.  Likely a contributing factor to her stroke as well as uncontrolled high blood pressure  CKD (chronic kidney disease), stage III (HCC) - will avoid nephrotoxic agents as  able  Tobacco abuse -Smoking cessation recommended -Continue patch as needed  Essential hypertension -Goal pressure less than 160/100 -Continue home meds -Use labetalol as needed    Antimicrobials:   DVT prophylaxis: SCD Code Status: Full Family Communication:  Disposition Plan: Status is: Inpatient  Remains inpatient appropriate because:Unsafe d/c plan and Inpatient level of care appropriate due to severity of illness   Dispo: The patient is from: Home              Anticipated d/c is to: Home              Anticipated d/c date is: 1 day              Patient currently is not medically stable to d/c.  Objective: Blood pressure (!) 153/100, pulse 88, temperature 97.7 F (36.5 C), temperature source Oral, resp. rate 14, height 5\' 4"  (1.626 m), weight 98 kg, SpO2 100 %.  Examination: General appearance: Pleasant adult woman lying in bed in no distress Head: Normocephalic, without obvious abnormality, atraumatic Eyes: EOMI Lungs: clear to auscultation bilaterally Heart: regular rate and rhythm and S1, S2 normal Abdomen: normal findings: bowel sounds normal and soft, non-tender Extremities: No edema Skin: mobility and turgor normal Neurologic: No focal weakness, gait deferred.  No dysmetria on finger-to-nose  Consultants:   Neuro  Procedures:     Data Reviewed: I have personally reviewed following labs and imaging studies Results for orders placed or performed during the hospital encounter of 11/14/20 (from the past 24 hour(s))  Hemoglobin A1c     Status: None   Collection Time: 11/16/20  3:23 AM  Result Value Ref  Range   Hgb A1c MFr Bld 5.5 4.8 - 5.6 %   Mean Plasma Glucose 111.15 mg/dL  CBC with Differential/Platelet     Status: Abnormal   Collection Time: 11/16/20  3:23 AM  Result Value Ref Range   WBC 8.4 4.0 - 10.5 K/uL   RBC 5.15 (H) 3.87 - 5.11 MIL/uL   Hemoglobin 14.2 12.0 - 15.0 g/dL   HCT 91.4 78.2 - 95.6 %   MCV 85.8 80.0 - 100.0 fL   MCH 27.6  26.0 - 34.0 pg   MCHC 32.1 30.0 - 36.0 g/dL   RDW 21.3 (H) 08.6 - 57.8 %   Platelets 235 150 - 400 K/uL   nRBC 0.0 0.0 - 0.2 %   Neutrophils Relative % 64 %   Neutro Abs 5.4 1.7 - 7.7 K/uL   Lymphocytes Relative 24 %   Lymphs Abs 2.0 0.7 - 4.0 K/uL   Monocytes Relative 10 %   Monocytes Absolute 0.8 0.1 - 1.0 K/uL   Eosinophils Relative 1 %   Eosinophils Absolute 0.1 0.0 - 0.5 K/uL   Basophils Relative 0 %   Basophils Absolute 0.0 0.0 - 0.1 K/uL   Immature Granulocytes 1 %   Abs Immature Granulocytes 0.04 0.00 - 0.07 K/uL  Basic metabolic panel     Status: Abnormal   Collection Time: 11/16/20  3:23 AM  Result Value Ref Range   Sodium 137 135 - 145 mmol/L   Potassium 3.7 3.5 - 5.1 mmol/L   Chloride 102 98 - 111 mmol/L   CO2 23 22 - 32 mmol/L   Glucose, Bld 123 (H) 70 - 99 mg/dL   BUN 18 6 - 20 mg/dL   Creatinine, Ser 4.69 (H) 0.44 - 1.00 mg/dL   Calcium 9.5 8.9 - 62.9 mg/dL   GFR, Estimated 58 (L) >60 mL/min   Anion gap 12 5 - 15  Phosphorus     Status: None   Collection Time: 11/16/20  3:23 AM  Result Value Ref Range   Phosphorus 4.4 2.5 - 4.6 mg/dL  Magnesium     Status: None   Collection Time: 11/16/20  3:23 AM  Result Value Ref Range   Magnesium 2.0 1.7 - 2.4 mg/dL  Lipid panel     Status: Abnormal   Collection Time: 11/16/20  3:23 AM  Result Value Ref Range   Cholesterol 177 0 - 200 mg/dL   Triglycerides 528 <413 mg/dL   HDL 34 (L) >24 mg/dL   Total CHOL/HDL Ratio 5.2 RATIO   VLDL 24 0 - 40 mg/dL   LDL Cholesterol 401 (H) 0 - 99 mg/dL    Recent Results (from the past 240 hour(s))  SARS CORONAVIRUS 2 (TAT 6-24 HRS) Nasopharyngeal Nasopharyngeal Swab     Status: None   Collection Time: 11/13/20  1:42 PM   Specimen: Nasopharyngeal Swab  Result Value Ref Range Status   SARS Coronavirus 2 NEGATIVE NEGATIVE Final    Comment: (NOTE) SARS-CoV-2 target nucleic acids are NOT DETECTED.  The SARS-CoV-2 RNA is generally detectable in upper and lower respiratory  specimens during the acute phase of infection. Negative results do not preclude SARS-CoV-2 infection, do not rule out co-infections with other pathogens, and should not be used as the sole basis for treatment or other patient management decisions. Negative results must be combined with clinical observations, patient history, and epidemiological information. The expected result is Negative.  Fact Sheet for Patients: HairSlick.no  Fact Sheet for Healthcare Providers: quierodirigir.com  This test  is not yet approved or cleared by the [place] and  has been authorized for detection and/or diagnosis of SARS-CoV-2 by FDA under an Emergency Use Authorization (EUA). This EUA will remain  in effect (meaning this test can be used) for the duration of the COVID-19 declaration under Se ction 564(b)(1) of the Act, 21 U.S.C. section 360bbb-3(b)(1), unless the authorization is terminated or revoked sooner.  Performed at [facility], [address], [place], [place] [address]   Resp Panel by RT-PCR (Flu A&B, Covid) Nasopharyngeal Swab     Status: None   Collection Time: 11/15/20  9:40 AM   Specimen: Nasopharyngeal Swab; Nasopharyngeal(NP) swabs in vial transport medium  Result Value Ref Range Status   SARS Coronavirus 2 by RT PCR NEGATIVE NEGATIVE Final    Comment: (NOTE) SARS-CoV-2 target nucleic acids are NOT DETECTED.  The SARS-CoV-2 RNA is generally detectable in upper respiratory specimens during the acute phase of infection. The lowest concentration of SARS-CoV-2 viral copies this assay can detect is 138 copies/mL. A negative result does not preclude SARS-Cov-2 infection and should not be used as the sole basis for treatment or other patient management decisions. A negative result may occur with  improper specimen collection/handling, submission of specimen other than nasopharyngeal swab, presence of viral mutation(s)  within the areas targeted by this assay, and inadequate number of viral copies(<138 copies/mL). A negative result must be combined with clinical observations, patient history, and epidemiological information. The expected result is Negative.  Fact Sheet for Patients:  BloggerCourse.com  Fact Sheet for Healthcare Providers:  SeriousBroker.it  This test is no t yet approved or cleared by the [place] FDA and  has been authorized for detection and/or diagnosis of SARS-CoV-2 by FDA under an Emergency Use Authorization (EUA). This EUA will remain  in effect (meaning this test can be used) for the duration of the COVID-19 declaration under Section 564(b)(1) of the Act, 21 U.S.C.section 360bbb-3(b)(1), unless the authorization is terminated  or revoked sooner.       Influenza A by PCR NEGATIVE NEGATIVE Final   Influenza B by PCR NEGATIVE NEGATIVE Final    Comment: (NOTE) The Xpert Xpress SARS-CoV-2/FLU/RSV plus assay is intended as an aid in the diagnosis of influenza from Nasopharyngeal swab specimens and should not be used as a sole basis for treatment. Nasal washings and aspirates are unacceptable for Xpert Xpress SARS-CoV-2/FLU/RSV testing.  Fact Sheet for Patients: BloggerCourse.com  Fact Sheet for Healthcare Providers: SeriousBroker.it  This test is not yet approved or cleared by the [place] FDA and has been authorized for detection and/or diagnosis of SARS-CoV-2 by FDA under an Emergency Use Authorization (EUA). This EUA will remain in effect (meaning this test can be used) for the duration of the COVID-19 declaration under Section 564(b)(1) of the Act, 21 U.S.C. section 360bbb-3(b)(1), unless the authorization is terminated or revoked.  Performed at [facility], [address], [place], [place] [address]      Radiology Studies: CT HEAD WO  CONTRAST  Result Date: 11/16/2020 CLINICAL DATA:  Stroke, follow-up, headache EXAM: CT HEAD WITHOUT CONTRAST TECHNIQUE: Contiguous axial images were obtained from the base of the skull through the vertex without intravenous contrast. COMPARISON:  11/15/2020 FINDINGS: Brain: Evolving large left cerebellar infarction is again identified. There is similar effacement of the fourth ventricle without hydrocephalus. Mild ascending transtentorial herniation and mild crowding at the foramen magnum are unchanged. There is no acute intracranial hemorrhage. Chronic right parietal infarct.  Stable chronic microvascular ischemic changes. Vascular: No new finding. Skull: Calvarium is unremarkable. Sinuses/Orbits: No acute finding. Other: None. IMPRESSION: Evolving acute large left cerebellar infarction. Similar mass effect. No hydrocephalus or hemorrhage. Electronically Signed   By: Guadlupe Spanish M.D.   On: 11/16/2020 11:15   CT Head Wo Contrast  Result Date: 11/15/2020 CLINICAL DATA:  Dizziness EXAM: CT HEAD WITHOUT CONTRAST TECHNIQUE: Contiguous axial images were obtained from the base of the skull through the vertex without intravenous contrast. COMPARISON:  None. FINDINGS: Brain: Ventricles and sulci overall are normal in size and configuration. There is vasogenic edema throughout much of the mid to lower left cerebellar hemisphere which impresses upon the posterior leftward aspect of the fourth ventricle as well as impressing upon the leftward aspect of the lower pons and medulla. Elsewhere, there is patchy small vessel disease in the centra semiovale bilaterally. There is decreased attenuation in the posterior right centrum semiovale extending to the gray-white junction which may represent a recent infarct in this area. There is no hemorrhage, extra-axial fluid collection, midline shift. A well-defined mass is not seen, although there is concern for mass with surrounding edema in the left cerebellum. Vascular: No  hyperdense vessel. There is calcification in each carotid siphon region. Skull: The bony calvarium appears intact. Sinuses/Orbits: Paranasal sinuses are clear. Orbits appear symmetric bilaterally. Other: Mastoid air cells are clear. IMPRESSION: Suspected vasogenic edema in the inferior to mid left cerebellum with impression on the leftward aspect of the lower pons and medulla as well as impression on the leftward posterior aspect of the fourth ventricle. While this area potentially could represent acute infarct, this appearance is more concerning for potential mass with edema surrounding. This appearance warrants correlation chronic MR pre and post-contrast to further evaluate. If there is a contraindication to MR, CT with intravenous contrast would be warranted in this circumstance. Elsewhere there is patchy periventricular small vessel disease. Decreased attenuation in the posterior right centrum semiovale with extension to the gray-white junction may represent an acute predominantly white matter infarct in this area. No acute hemorrhage.  No extra-axial fluid or midline shift. There are foci of arterial vascular calcification. These results were called by telephone at the time of interpretation on 11/15/2020 at 8:46 am to provider [facility] , who verbally acknowledged these results. Electronically Signed   By: Bretta Bang III M.D.   On: 11/15/2020 08:46   MR ANGIO HEAD WO CONTRAST  Result Date: 11/15/2020 CLINICAL DATA:  Chronic headache. Dizziness. Neurological deficit with suspected stroke. EXAM: MRI HEAD WITHOUT AND WITH CONTRAST MRA HEAD WITHOUT CONTRAST MRA NECK WITHOUT AND WITH CONTRAST TECHNIQUE: Multiplanar, multiecho pulse sequences of the brain and surrounding structures were obtained without and with intravenous contrast. Angiographic images of the Circle of Willis were obtained using MRA technique without intravenous contrast. Angiographic images of the neck were obtained using MRA  technique without and with intravenous contrast. Carotid stenosis measurements (when applicable) are obtained utilizing NASCET criteria, using the distal internal carotid diameter as the denominator. CONTRAST:  75mL GADAVIST GADOBUTROL 1 MMOL/ML IV SOLN COMPARISON:  Head CT same day FINDINGS: MRI HEAD FINDINGS Brain: There is acute infarction affecting the inferior cerebellum on the left. There is swelling and petechial blood products. Small focus of acute infarction in the right lateral cerebellum. No involvement of the actual brainstem. Chronic small-vessel ischemic changes affect the pons. No acute infarction in the supratentorial brain. There are moderate chronic small-vessel ischemic changes of the cerebral hemispheric white matter. Old  right parietal subcortical infarction. No mass, hydrocephalus or extra-axial collection. No abnormal contrast enhancement. Vascular: Major vessels at the base of the brain show flow. Skull and upper cervical spine: Negative Sinuses/Orbits: Clear/normal Other: None MRA HEAD FINDINGS Both internal carotid arteries are patent through the skull base and siphon region. The anterior and middle cerebral vessels are patent without proximal stenosis, aneurysm or vascular malformation. No large or medium vessel anterior circulation branch vessel occlusion. Both vertebral arteries show flow at the foramen magnum. Both vertebral arteries reach the basilar. The basilar artery is small and shows stenosis in the midportion. The superior cerebellar and posterior cerebral arteries show flow. Patent posterior communicating arteries on both sides. No PICA flow is demonstrated on the left. Right PICA is patent. MRA NECK FINDINGS Both common carotid arteries widely patent to the bifurcation regions. Mild atherosclerotic irregularity at the bifurcations but no stenosis. Cervical internal carotid arteries widely patent. Dominant right vertebral artery is patent through the cervical region. The origin  is not well seen because chest motion. 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 Vessel does show flow beyond that to the basilar. IMPRESSION: 1. Acute infarction affecting the inferior cerebellum on the left consistent with complete PICA distribution infarction. Swelling and petechial blood products. Small acute infarction in the lateral cerebellum on the right. Mass-effect upon the fourth ventricle but no obstructive hydrocephalus. 2. Moderate chronic small-vessel ischemic changes elsewhere throughout the brain. 3. No significant anterior circulation pathology on the MRA of the neck or intracranial MR angiography. 4. 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 There is reconstituted flow as a small vessel in the mid cervical region. Right PICA is patent. No flow seen in left PICA. Electronically Signed   By: Paulina Fusi M.D.   On: 11/15/2020 11:19   MR Angiogram Neck W or Wo Contrast  Result Date: 11/15/2020 CLINICAL DATA:  Chronic headache. Dizziness. Neurological deficit with suspected stroke. EXAM: MRI HEAD WITHOUT AND WITH CONTRAST MRA HEAD WITHOUT CONTRAST MRA NECK WITHOUT AND WITH CONTRAST TECHNIQUE: Multiplanar, multiecho pulse sequences of the brain and surrounding structures were obtained without and with intravenous contrast. Angiographic images of the Circle of Willis were obtained using MRA technique without intravenous contrast. Angiographic images of the neck were obtained using MRA technique without and with intravenous contrast. Carotid stenosis measurements (when applicable) are obtained utilizing NASCET criteria, using the distal internal carotid diameter as the denominator. CONTRAST:  10mL GADAVIST GADOBUTROL 1 MMOL/ML IV SOLN COMPARISON:  Head CT same day FINDINGS: MRI HEAD FINDINGS Brain: There is acute infarction affecting the inferior cerebellum on the left. There is  swelling and petechial blood products. Small focus of acute infarction in the right lateral cerebellum. No involvement of the actual brainstem. Chronic small-vessel ischemic changes affect the pons. No acute infarction in the supratentorial brain. There are moderate chronic small-vessel ischemic changes of the cerebral hemispheric white matter. Old right parietal subcortical infarction. No mass, hydrocephalus or extra-axial collection. No abnormal contrast enhancement. Vascular: Major vessels at the base of the brain show flow. Skull and upper cervical spine: Negative Sinuses/Orbits: Clear/normal Other: None MRA HEAD FINDINGS Both internal carotid arteries are patent through the skull base and siphon region. The anterior and middle cerebral vessels are patent without proximal stenosis, aneurysm or vascular malformation. No large or medium vessel anterior circulation branch vessel occlusion. Both vertebral arteries show flow at  the foramen magnum. Both vertebral arteries reach the basilar. The basilar artery is small and shows stenosis in the midportion. The superior cerebellar and posterior cerebral arteries show flow. Patent posterior communicating arteries on both sides. No PICA flow is demonstrated on the left. Right PICA is patent. MRA NECK FINDINGS Both common carotid arteries widely patent to the bifurcation regions. Mild atherosclerotic irregularity at the bifurcations but no stenosis. Cervical internal carotid arteries widely patent. Dominant right vertebral artery is patent through the cervical region. The origin is not well seen because chest motion. 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 Vessel does show flow beyond that to the basilar. IMPRESSION: 1. Acute infarction affecting the inferior cerebellum on the left consistent with complete PICA distribution infarction. Swelling and petechial blood products.  Small acute infarction in the lateral cerebellum on the right. Mass-effect upon the fourth ventricle but no obstructive hydrocephalus. 2. Moderate chronic small-vessel ischemic changes elsewhere throughout the brain. 3. No significant anterior circulation pathology on the MRA of the neck or intracranial MR angiography. 4. 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 There is reconstituted flow as a small vessel in the mid cervical region. Right PICA is patent. No flow seen in left PICA. Electronically Signed   By: Paulina Fusi M.D.   On: 11/15/2020 11:19   MR Brain W and Wo Contrast  Result Date: 11/15/2020 CLINICAL DATA:  Chronic headache. Dizziness. Neurological deficit with suspected stroke. EXAM: MRI HEAD WITHOUT AND WITH CONTRAST MRA HEAD WITHOUT CONTRAST MRA NECK WITHOUT AND WITH CONTRAST TECHNIQUE: Multiplanar, multiecho pulse sequences of the brain and surrounding structures were obtained without and with intravenous contrast. Angiographic images of the Circle of Willis were obtained using MRA technique without intravenous contrast. Angiographic images of the neck were obtained using MRA technique without and with intravenous contrast. Carotid stenosis measurements (when applicable) are obtained utilizing NASCET criteria, using the distal internal carotid diameter as the denominator. CONTRAST:  10mL GADAVIST GADOBUTROL 1 MMOL/ML IV SOLN COMPARISON:  Head CT same day FINDINGS: MRI HEAD FINDINGS Brain: There is acute infarction affecting the inferior cerebellum on the left. There is swelling and petechial blood products. Small focus of acute infarction in the right lateral cerebellum. No involvement of the actual brainstem. Chronic small-vessel ischemic changes affect the pons. No acute infarction in the supratentorial brain. There are moderate chronic small-vessel ischemic changes of the cerebral hemispheric white matter. Old right parietal subcortical infarction. No mass,  hydrocephalus or extra-axial collection. No abnormal contrast enhancement. Vascular: Major vessels at the base of the brain show flow. Skull and upper cervical spine: Negative Sinuses/Orbits: Clear/normal Other: None MRA HEAD FINDINGS Both internal carotid arteries are patent through the skull base and siphon region. The anterior and middle cerebral vessels are patent without proximal stenosis, aneurysm or vascular malformation. No large or medium vessel anterior circulation branch vessel occlusion. Both vertebral arteries show flow at the foramen magnum. Both vertebral arteries reach the basilar. The basilar artery is small and shows stenosis in the midportion. The superior cerebellar and posterior cerebral arteries show flow. Patent posterior communicating arteries on both sides. No PICA flow is demonstrated on the left. Right PICA is patent. MRA NECK FINDINGS Both common carotid arteries widely patent to the bifurcation regions. Mild atherosclerotic irregularity at the bifurcations but no stenosis. Cervical internal carotid arteries widely patent. Dominant right vertebral artery is patent through  the cervical region. The origin is not well seen because chest motion. 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 Vessel does show flow beyond that to the basilar. IMPRESSION: 1. Acute infarction affecting the inferior cerebellum on the left consistent with complete PICA distribution infarction. Swelling and petechial blood products. Small acute infarction in the lateral cerebellum on the right. Mass-effect upon the fourth ventricle but no obstructive hydrocephalus. 2. Moderate chronic small-vessel ischemic changes elsewhere throughout the brain. 3. No significant anterior circulation pathology on the MRA of the neck or intracranial MR angiography. 4. 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 There is  reconstituted flow as a small vessel in the mid cervical region. Right PICA is patent. No flow seen in left PICA. Electronically Signed   By: Paulina Fusi M.D.   On: 11/15/2020 11:19   ECHOCARDIOGRAM COMPLETE  Result Date: 11/15/2020    ECHOCARDIOGRAM REPORT   Patient Name:   SCHERRY LAVERNE Date of Exam: 11/15/2020 Medical Rec #:  [id number]       Height:       64.0 in Accession #:    [id number]      Weight:       216.0 lb Date of Birth:  10-15-1961       BSA:          2.021 m Patient Age:    59 years        BP:           216/116 mmHg Patient Gender: F               HR:           95 bpm. Exam Location:  Inpatient Procedure: 2D Echo Indications:    Stroke 434.91 / I163.9  History:        Patient has no prior history of Echocardiogram examinations.                 Stroke; Risk Factors:Current Smoker and Hypertension.  Sonographer:    Jeryl Columbia Referring Phys: [id number] Kieth Brightly MACNEIL IMPRESSIONS  1. Left ventricular ejection fraction, by estimation, is 60 to 65%. The left ventricle has normal function. The left ventricle has no regional wall motion abnormalities. There is moderate concentric left ventricular hypertrophy. Left ventricular diastolic parameters are consistent with Grade I diastolic dysfunction (impaired relaxation).  2. Right ventricular systolic function is normal. The right ventricular size is normal. Tricuspid regurgitation signal is inadequate for assessing PA pressure.  3. The mitral valve is grossly normal. Trivial mitral valve regurgitation. No evidence of mitral stenosis.  4. The aortic valve is tricuspid. There is mild calcification of the aortic valve. There is mild thickening of the aortic valve. Aortic valve regurgitation is not visualized. Mild aortic valve sclerosis is present, with no evidence of aortic valve stenosis.  5. The inferior vena cava is normal in size with greater than 50% respiratory variability, suggesting right atrial pressure of 3 mmHg.  Conclusion(s)/Recommendation(s): No intracardiac source of embolism detected on this transthoracic study. A transesophageal echocardiogram is recommended to exclude cardiac source of embolism if clinically indicated. FINDINGS  Left Ventricle: Left ventricular ejection fraction, by estimation, is 60 to 65%. The left ventricle has normal function. The left ventricle has no regional wall motion abnormalities. The left ventricular internal cavity size was normal in size. There is  moderate concentric left ventricular  hypertrophy. Left ventricular diastolic parameters are consistent with Grade I diastolic dysfunction (impaired relaxation). Normal left ventricular filling pressure. Right Ventricle: The right ventricular size is normal. No increase in right ventricular wall thickness. Right ventricular systolic function is normal. Tricuspid regurgitation signal is inadequate for assessing PA pressure. Left Atrium: Left atrial size was normal in size. Right Atrium: Right atrial size was normal in size. Pericardium: Trivial pericardial effusion is present. Mitral Valve: The mitral valve is grossly normal. Trivial mitral valve regurgitation. No evidence of mitral valve stenosis. Tricuspid Valve: The tricuspid valve is grossly normal. Tricuspid valve regurgitation is not demonstrated. No evidence of tricuspid stenosis. Aortic Valve: The aortic valve is tricuspid. There is mild calcification of the aortic valve. There is mild thickening of the aortic valve. Aortic valve regurgitation is not visualized. Mild aortic valve sclerosis is present, with no evidence of aortic valve stenosis. Pulmonic Valve: The pulmonic valve was grossly normal. Pulmonic valve regurgitation is not visualized. No evidence of pulmonic stenosis. Aorta: The aortic root is normal in size and structure. Venous: The right upper pulmonary vein is normal. The inferior vena cava is normal in size with greater than 50% respiratory variability, suggesting right  atrial pressure of 3 mmHg. IAS/Shunts: The atrial septum is grossly normal.  LEFT VENTRICLE PLAX 2D LVIDd:         3.50 cm  Diastology LVIDs:         2.40 cm  LV e' medial:    4.35 cm/s LV PW:         1.70 cm  LV E/e' medial:  9.6 LV IVS:        1.40 cm  LV e' lateral:   5.44 cm/s LVOT diam:     2.00 cm  LV E/e' lateral: 7.6 LVOT Area:     3.14 cm  RIGHT VENTRICLE RV S prime:     25.90 cm/s TAPSE (M-mode): 3.0 cm LEFT ATRIUM             Index LA diam:        4.20 cm 2.08 cm/m LA Vol (A2C):   23.1 ml 11.43 ml/m LA Vol (A4C):   30.2 ml 14.94 ml/m LA Biplane Vol: 29.3 ml 14.49 ml/m   AORTA Ao Root diam: 2.80 cm MITRAL VALVE MV Area (PHT): 3.65 cm    SHUNTS MV Decel Time: 208 msec    Systemic Diam: 2.00 cm MV E velocity: 41.60 cm/s MV A velocity: 63.00 cm/s MV E/A ratio:  0.66 Lennie OdorWesley O'Neal MD Electronically signed by Lennie OdorWesley O'Neal MD Signature Date/Time: 11/15/2020/3:08:34 PM    Final    CT HEAD WO CONTRAST  Final Result    MR Brain W and Wo Contrast  Final Result    MR Angiogram Neck W or Wo Contrast  Final Result    MR ANGIO HEAD WO CONTRAST  Final Result    CT Head Wo Contrast  Final Result      Scheduled Meds: . amLODipine  10 mg Oral Daily  . atorvastatin  40 mg Oral Daily  . losartan  100 mg Oral Daily   And  . hydrochlorothiazide  12.5 mg Oral Daily   PRN Meds: albuterol, labetalol Continuous Infusions:   LOS: 1 day  Time spent: Greater than 50% of the 35 minute visit was spent in counseling/coordination of care for the patient as laid out in the A&P.   Lewie Chamberavid Deana Krock, MD [facility] 11/16/2020, 3:10 PM

## 2020-11-16 NOTE — Assessment & Plan Note (Signed)
-   will avoid nephrotoxic agents as able

## 2020-11-16 NOTE — Evaluation (Signed)
Occupational Therapy Evaluation Patient Details Name: Deborah Mcmahon MRN: [id number] DOB: [birth year] Today's Date: 11/16/2020    History of Present Illness 59 y.o. female with hx of tobacco abuse, obesity, poorly controlled essential hypertension, who presented to the hospital on 11/15/2020 secondary to headache, dizziness, and elevated blood pressure at home. Patient reports not taking blood pressure medication x2 weeks. MRI (+) acute L cerebellar CVA.   Clinical Impression   PTA patient was living alone in a single-level private residence and was independent with ADLs/IADLs with occasional use of SPC. Patient currently functioning at supervision A level grossly for all ADLs, ADL transfers, and short-distance mobility without AD. Patient demonstrates deficits in dynamic standing balance indicating increased risk of falls. Patient would benefit from continued acute OT services to maximize safety and independence with self-care tasks in prep for d/c to next level of care. Recommendation for initial 24hr supervision/assist.     Follow Up Recommendations  Home health OT;Supervision/Assistance - 24 hour    Equipment Recommendations  None recommended by OT (Patient has necessary DME)    Recommendations for Other Services       Precautions / Restrictions Precautions Precautions: Fall Precaution Comments: SBP<160 and DBP <100 Restrictions Weight Bearing Restrictions: No      Mobility Bed Mobility Overal bed mobility: Modified Independent             General bed mobility comments: increased time    Transfers Overall transfer level: Needs assistance Equipment used: None Transfers: Sit to/from Stand Sit to Stand: Supervision              Balance Overall balance assessment: Needs assistance Sitting-balance support: No upper extremity supported;Feet supported Sitting balance-Leahy Scale: Good     Standing balance support: No upper extremity supported;During functional  activity Standing balance-Leahy Scale: Fair Standing balance comment: close supervision without UE support. Patient notes deficits in balance compared to baseline.                           ADL either performed or assessed with clinical judgement   ADL Overall ADL's : Needs assistance/impaired     Grooming: Supervision/safety;Standing               Lower Body Dressing: Min guard;Sit to/from stand Lower Body Dressing Details (indicate cue type and reason): Patient able to doff/don footwear seated EOB without assist. Patient would likley require Min guard for steadying to don LB clothing. Toilet Transfer: Radiographer, therapeutic Details (indicate cue type and reason): To standard commode with use of grab bar. Toileting- Clothing Manipulation and Hygiene: Supervision/safety Toileting - Clothing Manipulation Details (indicate cue type and reason): Hygiene completed in sitting. Patient able to manage clothing with close supervision A for safety.     Functional mobility during ADLs: Supervision/safety General ADL Comments: Supervision A grossly for ADLs and ADL transfers.     Vision Baseline Vision/History: Wears glasses Wears Glasses: At all times Patient Visual Report: No change from baseline Vision Assessment?: No apparent visual deficits     Perception     Praxis      Pertinent Vitals/Pain Pain Assessment: No/denies pain     Hand Dominance Right   Extremity/Trunk Assessment Upper Extremity Assessment Upper Extremity Assessment: Overall WFL for tasks assessed   Lower Extremity Assessment Lower Extremity Assessment: Overall WFL for tasks assessed   Cervical / Trunk Assessment Cervical / Trunk Assessment: Normal   Communication Communication Communication: No difficulties   Cognition  Arousal/Alertness: Lethargic (Difficulty to arouse but more awake once seated EOB.) Behavior During Therapy: WFL for tasks assessed/performed Overall  Cognitive Status: Within Functional Limits for tasks assessed                                     General Comments  VSS on RA, BP 136/93 pre-mobility, 154/109 post-mobility    Exercises     Shoulder Instructions      Home Living Family/patient expects to be discharged to:: Private residence Living Arrangements: Other relatives (aunt) Available Help at Discharge: Available PRN/intermittently Type of Home: House Home Access: Stairs to enter Secretary/administrator of Steps: 5 Entrance Stairs-Rails: Can reach both Home Layout: One level     Bathroom Shower/Tub: Chief Strategy Officer: Standard     Home Equipment: Cane - single point;Other (comment) (shower chair)      Lives With: Other (Comment) (roommate)    Prior Functioning/Environment Level of Independence: Independent                 OT Problem List: Impaired balance (sitting and/or standing);Decreased coordination;Decreased safety awareness;Decreased knowledge of use of DME or AE      OT Treatment/Interventions: Self-care/ADL training;Therapeutic exercise;Neuromuscular education;DME and/or AE instruction;Therapeutic activities;Visual/perceptual remediation/compensation;Patient/family education;Balance training    OT Goals(Current goals can be found in the care plan section) Acute Rehab OT Goals Patient Stated Goal: To return home. OT Goal Formulation: With patient Time For Goal Achievement: 11/30/20 Potential to Achieve Goals: Good ADL Goals Pt Will Perform Tub/Shower Transfer: Tub transfer;shower seat Additional ADL Goal #1: Patient will complete A.M. ADLs with Mod I and use of LRAD demonstrating good safety awareness.  OT Frequency: Min 2X/week   Barriers to D/C:            Co-evaluation              AM-PAC OT "6 Clicks" Daily Activity     Outcome Measure Help from another person eating meals?: None Help from another person taking care of personal grooming?: A  Little Help from another person toileting, which includes using toliet, bedpan, or urinal?: A Little Help from another person bathing (including washing, rinsing, drying)?: A Little Help from another person to put on and taking off regular upper body clothing?: None Help from another person to put on and taking off regular lower body clothing?: A Little 6 Click Score: 20   End of Session Equipment Utilized During Treatment: Gait belt  Activity Tolerance: Patient tolerated treatment well Patient left: in bed;with call bell/phone within reach;with bed alarm set  OT Visit Diagnosis: Unsteadiness on feet (R26.81)                Time: 3662-9476 OT Time Calculation (min): 11 min Charges:  OT General Charges $OT Visit: 1 Visit OT Evaluation $OT Eval Low Complexity: 1 Low  Jeury Mcnab H. OTR/L Supplemental OT, Department of rehab services [phone]  Elenie Coven R H. 11/16/2020, 2:18 PM

## 2020-11-16 NOTE — TOC CAGE-AID Note (Signed)
Transition of Care [facility]) - CAGE-AID Screening   Patient Details  Name: Deborah Mcmahon MRN: [id number] Date of Birth: 03/09/1961  Transition of [facility]) CM/SW Contact:    Kermit Balo, RN Phone Number: 11/16/2020, 3:59 PM   Clinical Narrative: Pt verbalizes use of cocaine and says she needs to stop. CM provided her resources for inpatient/ outpatient counseling. She was appreciative.    CAGE-AID Screening:    Have You Ever Felt You Ought to Cut Down on Your Drinking or Drug Use?: Yes Have People Annoyed You By Critizing Your Drinking Or Drug Use?: No Have You Felt Bad Or Guilty About Your Drinking Or Drug Use?: Yes Have You Ever Had a Drink or Used Drugs First Thing In The Morning to Steady Your Nerves or to Get Rid of a Hangover?: No CAGE-AID Score: 2  Substance Abuse Education Offered: Yes  Substance abuse interventions: Patient Counseling,Educational Materials

## 2020-11-16 NOTE — Assessment & Plan Note (Addendum)
-  Home meds refilled at discharge

## 2020-11-16 NOTE — Progress Notes (Addendum)
OT Cancellation Note  Patient Details Name: Deborah Mcmahon MRN: [id number] DOB: 1961-07-20   Cancelled Treatment:    Reason Eval/Treat Not Completed: Patient at procedure or test/ unavailable. Patient off floor for CT at time of first attempt. OT attempted to see patient a second time but meal tray had just been delivered. OT will check back as time allows.   Kallie Edward OTR/L Supplemental OT, Department of rehab services [phone]  Lianette Broussard R H. 11/16/2020, 12:30 PM

## 2020-11-16 NOTE — Evaluation (Signed)
Physical Therapy Evaluation Patient Details Name: Deborah Mcmahon MRN: [id number] DOB: [birth year] Today's Date: 11/16/2020   History of Present Illness  59 y.o. female with hx of tobacco abuse, obesity, poorly controlled essential hypertension, who presented to the hospital on 11/15/2020 secondary to headache, dizziness, and elevated blood pressure at home. Patient reports not taking blood pressure medication x2 weeks. MRI (+) acute L cerebellar CVA.  Clinical Impression  Pt presents to PT with deficits in functional mobility, gait, balance. Pt with increased sway and drift when ambulating which is exacerbated with dual-task activities. Pt demonstrates slow and deliberate movements with coordination testing of UEs however PT is unsure if this is related to poor cognition or difficulty following multi-step commands. Pt will benefit from continued acute PT POC to improve balance and reduce falls risk. PT recommends discharge home with HHPT and a RW, as well as 24/7 assistance from aunt.    Follow Up Recommendations Home health PT;Supervision/Assistance - 24 hour    Equipment Recommendations  Rolling walker with 5" wheels    Recommendations for Other Services       Precautions / Restrictions Precautions Precautions: Fall Precaution Comments: SBP<160 Restrictions Weight Bearing Restrictions: No      Mobility  Bed Mobility Overal bed mobility: Modified Independent             General bed mobility comments: increased time    Transfers Overall transfer level: Needs assistance Equipment used: None Transfers: Sit to/from Stand Sit to Stand: Supervision            Ambulation/Gait Ambulation/Gait assistance: Supervision Gait Distance (Feet): 150 Feet Assistive device: None Gait Pattern/deviations: Step-through pattern Gait velocity: reduced Gait velocity interpretation: <1.8 ft/sec, indicate of risk for recurrent falls General Gait Details: pt with increased lateral  sway, exacerbated by head turns and changes of direction. Pt is unable to significantly increase gait speed  Stairs            Wheelchair Mobility    Modified Rankin (Stroke Patients Only) Modified Rankin (Stroke Patients Only) Pre-Morbid Rankin Score: No symptoms Modified Rankin: Moderately severe disability     Balance Overall balance assessment: Needs assistance Sitting-balance support: No upper extremity supported;Feet supported Sitting balance-Leahy Scale: Good     Standing balance support: No upper extremity supported;During functional activity Standing balance-Leahy Scale: Good Standing balance comment: close supervision without UE support                             Pertinent Vitals/Pain Pain Assessment: No/denies pain    Home Living Family/patient expects to be discharged to:: Private residence Living Arrangements: Other relatives (aunt) Available Help at Discharge: Available PRN/intermittently Type of Home: House Home Access: Stairs to enter Entrance Stairs-Rails: Can reach both Entrance Stairs-Number of Steps: 5 Home Layout: One level Home Equipment: Cane - single point      Prior Function Level of Independence: Independent               Hand Dominance   Dominant Hand: Right    Extremity/Trunk Assessment   Upper Extremity Assessment Upper Extremity Assessment: Overall WFL for tasks assessed    Lower Extremity Assessment Lower Extremity Assessment: Overall WFL for tasks assessed    Cervical / Trunk Assessment Cervical / Trunk Assessment: Normal  Communication   Communication: No difficulties  Cognition Arousal/Alertness: Lethargic (following asleep multiple times during history) Behavior During Therapy: WFL for tasks assessed/performed Overall Cognitive Status: Within Functional Limits  for tasks assessed                                        General Comments General comments (skin integrity, edema,  etc.): VSS on RA, BP 136/93 pre-mobility, 154/109 post-mobility    Exercises     Assessment/Plan    PT Assessment Patient needs continued PT services  PT Problem List Decreased balance;Decreased mobility;Decreased knowledge of use of DME;Decreased safety awareness;Decreased knowledge of precautions       PT Treatment Interventions DME instruction;Gait training;Stair training;Functional mobility training;Therapeutic activities;Balance training;Neuromuscular re-education;Patient/family education    PT Goals (Current goals can be found in the Care Plan section)  Acute Rehab PT Goals Patient Stated Goal: to return to independence PT Goal Formulation: With patient Time For Goal Achievement: 11/30/20 Potential to Achieve Goals: Good Additional Goals Additional Goal #1: Pt will score >19/24 on DGI to indicate a reduced falls risk    Frequency Min 4X/week   Barriers to discharge        Co-evaluation               AM-PAC PT "6 Clicks" Mobility  Outcome Measure Help needed turning from your back to your side while in a flat bed without using bedrails?: None Help needed moving from lying on your back to sitting on the side of a flat bed without using bedrails?: None Help needed moving to and from a bed to a chair (including a wheelchair)?: A Little Help needed standing up from a chair using your arms (e.g., wheelchair or bedside chair)?: A Little Help needed to walk in hospital room?: A Little Help needed climbing 3-5 steps with a railing? : A Lot 6 Click Score: 19    End of Session   Activity Tolerance: Patient tolerated treatment well Patient left: in chair;with call bell/phone within reach;with chair alarm set Nurse Communication: Mobility status PT Visit Diagnosis: Unsteadiness on feet (R26.81)    Time: 1109-1130 PT Time Calculation (min) (ACUTE ONLY): 21 min   Charges:   PT Evaluation $PT Eval Moderate Complexity: 1 Mod          Arlyss Gandy, PT, DPT Acute  Rehabilitation Pager: [phone]   Arlyss Gandy 11/16/2020, 12:54 PM

## 2020-11-16 NOTE — Assessment & Plan Note (Signed)
-  UDS positive for cocaine.  Likely a contributing factor to her stroke as well as uncontrolled high blood pressure

## 2020-11-16 NOTE — Progress Notes (Signed)
STROKE TEAM PROGRESS NOTE   INTERVAL HISTORY I have personally reviewed history of presenting illness with the patient, electronic medical records and imaging films in PACS.  She presented with 4 to 5 days of dizziness and gait ataxia with nausea due to left posterior inferior cerebral artery infarct.  MRI scan shows mild cytotoxic edema but no significant mass-effect or hydrocephalus.  Repeat CT scan of the head done today shows stable appearance without progression of edema or hydrocephalus.  Urine drug screen is positive for cocaine.  Echocardiogram was unremarkable.  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  LDL cholesterol is 1 1 9  mg percent and hemoglobin A1c is 5.5. Vitals:   11/15/20 2305 11/16/20 0300 11/16/20 0721 11/16/20 1146  BP: (!) 177/96 (!) 169/91 (!) 154/109 (!) 153/100  Pulse: 79 82 92 88  Resp: 16 18 18 14   Temp: 98.3 F (36.8 C) 98.5 F (36.9 C) 98.8 F (37.1 C) 97.7 F (36.5 C)  TempSrc: Oral Oral Oral Oral  SpO2: 99% 98% 100% 100%  Weight:      Height:       CBC:  Recent Labs  Lab 11/15/20 0754 11/16/20 0323  WBC 8.0 8.4  NEUTROABS 4.3 5.4  HGB 14.1 14.2  HCT 44.6 44.2  MCV 87.1 85.8  PLT 242 235   Basic Metabolic Panel:  Recent Labs  Lab 11/15/20 0754 11/16/20 0323  NA 137 137  K 3.9 3.7  CL 102 102  CO2 24 23  GLUCOSE 116* 123*  BUN 18 18  CREATININE 1.21* 1.10*  CALCIUM 9.5 9.5  MG  --  2.0  PHOS  --  4.4   Lipid Panel:  Recent Labs  Lab 11/16/20 0323  CHOL 177  TRIG 119  HDL 34*  CHOLHDL 5.2  VLDL 24  LDLCALC 11/18/20*   HgbA1c:  Recent Labs  Lab 11/16/20 0323  HGBA1C 5.5   Urine Drug Screen:  Recent Labs  Lab 11/15/20 1316  LABOPIA NONE DETECTED  COCAINSCRNUR POSITIVE*  LABBENZ NONE DETECTED  AMPHETMU NONE DETECTED  THCU NONE DETECTED  LABBARB NONE DETECTED    Alcohol Level No results for input(s): ETH in the last 168 hours.  IMAGING past 24 hours CT HEAD WO CONTRAST  Result Date:  11/16/2020 CLINICAL DATA:  Stroke, follow-up, headache EXAM: CT HEAD WITHOUT CONTRAST TECHNIQUE: Contiguous axial images were obtained from the base of the skull through the vertex without intravenous contrast. COMPARISON:  11/15/2020 FINDINGS: Brain: Evolving large left cerebellar infarction is again identified. There is similar effacement of the fourth ventricle without hydrocephalus. Mild ascending transtentorial herniation and mild crowding at the foramen magnum are unchanged. There is no acute intracranial hemorrhage. Chronic right parietal infarct. Stable chronic microvascular ischemic changes. Vascular: No new finding. Skull: Calvarium is unremarkable. Sinuses/Orbits: No acute finding. Other: None. IMPRESSION: Evolving acute large left cerebellar infarction. Similar mass effect. No hydrocephalus or hemorrhage. Electronically Signed   By: 11/18/2020 M.D.   On: 11/16/2020 11:15   ECHOCARDIOGRAM COMPLETE  Result Date: 11/15/2020    ECHOCARDIOGRAM REPORT   Patient Name:   Deborah Mcmahon Date of Exam: 11/15/2020 Medical Rec #:  Deborah Mcmahon       Height:       64.0 in Accession #:    11/17/2020      Weight:       216.0 lb Date of Birth:  Sep 17, 1961       BSA:  2.021 m Patient Age:    59 years        BP:           216/116 mmHg Patient Gender: F               HR:           95 bpm. Exam Location:  Inpatient Procedure: 2D Echo Indications:    Stroke 434.91 / I163.9  History:        Patient has no prior history of Echocardiogram examinations.                 Stroke; Risk Factors:Current Smoker and Hypertension.  Sonographer:    Deborah ColumbiaJohanna Mcmahon Referring Phys: [id number] Deborah BrightlyICHARD G Mcmahon IMPRESSIONS  1. Left ventricular ejection fraction, by estimation, is 60 to 65%. The left ventricle has normal function. The left ventricle has no regional wall motion abnormalities. There is moderate concentric left ventricular hypertrophy. Left ventricular diastolic parameters are consistent with Grade I diastolic  dysfunction (impaired relaxation).  2. Right ventricular systolic function is normal. The right ventricular size is normal. Tricuspid regurgitation signal is inadequate for assessing PA pressure.  3. The mitral valve is grossly normal. Trivial mitral valve regurgitation. No evidence of mitral stenosis.  4. The aortic valve is tricuspid. There is mild calcification of the aortic valve. There is mild thickening of the aortic valve. Aortic valve regurgitation is not visualized. Mild aortic valve sclerosis is present, with no evidence of aortic valve stenosis.  5. The inferior vena cava is normal in size with greater than 50% respiratory variability, suggesting right atrial pressure of 3 mmHg. Conclusion(s)/Recommendation(s): No intracardiac source of embolism detected on this transthoracic study. A transesophageal echocardiogram is recommended to exclude cardiac source of embolism if clinically indicated. FINDINGS  Left Ventricle: Left ventricular ejection fraction, by estimation, is 60 to 65%. The left ventricle has normal function. The left ventricle has no regional wall motion abnormalities. The left ventricular internal cavity size was normal in size. There is  moderate concentric left ventricular hypertrophy. Left ventricular diastolic parameters are consistent with Grade I diastolic dysfunction (impaired relaxation). Normal left ventricular filling pressure. Right Ventricle: The right ventricular size is normal. No increase in right ventricular wall thickness. Right ventricular systolic function is normal. Tricuspid regurgitation signal is inadequate for assessing PA pressure. Left Atrium: Left atrial size was normal in size. Right Atrium: Right atrial size was normal in size. Pericardium: Trivial pericardial effusion is present. Mitral Valve: The mitral valve is grossly normal. Trivial mitral valve regurgitation. No evidence of mitral valve stenosis. Tricuspid Valve: The tricuspid valve is grossly normal.  Tricuspid valve regurgitation is not demonstrated. No evidence of tricuspid stenosis. Aortic Valve: The aortic valve is tricuspid. There is mild calcification of the aortic valve. There is mild thickening of the aortic valve. Aortic valve regurgitation is not visualized. Mild aortic valve sclerosis is present, with no evidence of aortic valve stenosis. Pulmonic Valve: The pulmonic valve was grossly normal. Pulmonic valve regurgitation is not visualized. No evidence of pulmonic stenosis. Aorta: The aortic root is normal in size and structure. Venous: The right upper pulmonary vein is normal. The inferior vena cava is normal in size with greater than 50% respiratory variability, suggesting right atrial pressure of 3 mmHg. IAS/Shunts: The atrial septum is grossly normal.  LEFT VENTRICLE PLAX 2D LVIDd:         3.50 cm  Diastology LVIDs:         2.40 cm  LV  e' medial:    4.35 cm/s LV PW:         1.70 cm  LV E/e' medial:  9.6 LV IVS:        1.40 cm  LV e' lateral:   5.44 cm/s LVOT diam:     2.00 cm  LV E/e' lateral: 7.6 LVOT Area:     3.14 cm  RIGHT VENTRICLE RV S prime:     25.90 cm/s TAPSE (M-mode): 3.0 cm LEFT ATRIUM             Index LA diam:        4.20 cm 2.08 cm/m LA Vol (A2C):   23.1 ml 11.43 ml/m LA Vol (A4C):   30.2 ml 14.94 ml/m LA Biplane Vol: 29.3 ml 14.49 ml/m   AORTA Ao Root diam: 2.80 cm MITRAL VALVE MV Area (PHT): 3.65 cm    SHUNTS MV Decel Time: 208 msec    Systemic Diam: 2.00 cm MV E velocity: 41.60 cm/s MV A velocity: 63.00 cm/s MV E/A ratio:  0.66 Deborah Odor MD Electronically signed by Deborah Odor MD Signature Date/Time: 11/15/2020/3:08:34 PM    Final     PHYSICAL EXAM Pleasant middle-age obese African-American lady not in distress. . Afebrile. Head is nontraumatic. Neck is supple without bruit.    Cardiac exam no murmur or gallop. Lungs are clear to auscultation. Distal pulses are well felt. Neurological Exam ;  Awake  Alert oriented x 3. Normal speech and language.eye movements full  without nystagmus.fundi were not visualized. Vision acuity and fields appear normal. Hearing is normal. Palatal movements are normal. Face symmetric. Tongue midline. Normal strength, tone, reflexes and coordination. Normal sensation. Gait deferred.  ASSESSMENT/PLAN Ms. FREDDY SPADAFORA is a 59 y.o. female with history of essential hypertension, tobacco abuse, and obesity presenting with 6d hx headache and dizziness and 1 episode of slurred speech.   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 Patchy periventricular small vessel disease. Posterior R centrum semiovale hypoattenuation. Trace calcification  MRI w/w/o acute inferior L PCA infarct w/ edema and petechial hemorrhage. Small R lateral cerebellar infarct. Mass effect 4th ventricle. Moderate small vessel disease throughout  MRA head Unremarkable   MRA neck L VA origin occlusion. No flow seen L PICA  Repeat CT head evolving L cerebellar infarct w/ stable mass effect  2D Echo EF 60-65%. No source of embolus   LDL 119  HgbA1c 5.5  VTE prophylaxis - Lovenox 40 mg sq daily added  No antithrombotic prior to admission, now on aspirin 81 mg daily. Add plavix. Continue DAPT  X 3 months the aspirin alone   Therapy recommendations:  HH PT, HH OT  Disposition:  Pending   Hypertensive Emergency  BP as high as 22/172 on arrival . Permissive hypertension (OK if < 220/120) but gradually normalize in 5-7 days . Long-term BP goal normotensive  Hyperlipidemia  Home meds:  No statin   Now on lipitor 40  LDL 119, goal < 70  Continue statin at discharge  Other Stroke Risk Factors  Cigarette smoker, advised to stop smoking  Substance abuse - UDS:  Cocaine POSITIVE. Patient advised to stop using due to stroke risk.  Obesity, Body mass index is 37.08 kg/m., BMI >/= 30 associated with increased stroke risk, recommend weight loss, diet and  exercise as appropriate   Other Active Problems  CKD stage III  Hospital day # 1 Patient presented with cerebral infarct secondary likely to large vessel posterior circulation disease in setting of cocaine abuse.  She was counseled to quit cocaine as well as smoking cigarettes and alcohol.  Aspirin and Plavix for 3 months followed by aspirin alone and aggressive risk factor modification.  Physical occupational therapy consults.  Greater than 50% time during this 35-minute visit was spent in counseling and coordination of care about her stroke and answering questions.  Discussed with Dr. Marylu Lund. Delia Heady, MD  To contact Stroke Continuity provider, please refer to [email or web address]. After hours, contact General Neurology

## 2020-11-17 LAB — BASIC METABOLIC PANEL
Anion gap: 11 (ref 5–15)
BUN: 26 mg/dL — ABNORMAL HIGH (ref 6–20)
CO2: 25 mmol/L (ref 22–32)
Calcium: 9.2 mg/dL (ref 8.9–10.3)
Chloride: 99 mmol/L (ref 98–111)
Creatinine, Ser: 1.26 mg/dL — ABNORMAL HIGH (ref 0.44–1.00)
GFR, Estimated: 49 mL/min — ABNORMAL LOW (ref >60)
Glucose, Bld: 106 mg/dL — ABNORMAL HIGH (ref 70–99)
Potassium: 4.3 mmol/L (ref 3.5–5.1)
Sodium: 135 mmol/L (ref 135–145)

## 2020-11-17 LAB — MAGNESIUM: Magnesium: 2 mg/dL (ref 1.7–2.4)

## 2020-11-17 LAB — CBC WITH DIFFERENTIAL/PLATELET
Abs Immature Granulocytes: 0.02 10*3/uL (ref 0.00–0.07)
Basophils Absolute: 0 10*3/uL (ref 0.0–0.1)
Basophils Relative: 0 %
Eosinophils Absolute: 0.1 10*3/uL (ref 0.0–0.5)
Eosinophils Relative: 1 %
HCT: 42.4 % (ref 36.0–46.0)
Hemoglobin: 13.7 g/dL (ref 12.0–15.0)
Immature Granulocytes: 0 %
Lymphocytes Relative: 39 %
Lymphs Abs: 3 10*3/uL (ref 0.7–4.0)
MCH: 27.7 pg (ref 26.0–34.0)
MCHC: 32.3 g/dL (ref 30.0–36.0)
MCV: 85.8 fL (ref 80.0–100.0)
Monocytes Absolute: 0.9 10*3/uL (ref 0.1–1.0)
Monocytes Relative: 12 %
Neutro Abs: 3.6 10*3/uL (ref 1.7–7.7)
Neutrophils Relative %: 48 %
Platelets: 226 10*3/uL (ref 150–400)
RBC: 4.94 MIL/uL (ref 3.87–5.11)
RDW: 16.1 % — ABNORMAL HIGH (ref 11.5–15.5)
WBC: 7.6 10*3/uL (ref 4.0–10.5)
nRBC: 0 % (ref 0.0–0.2)

## 2020-11-17 MED ORDER — ASPIRIN 81 MG PO TBEC: 81.0000 mg | DELAYED_RELEASE_TABLET | Freq: Every day | ORAL | 0 refills | Status: DC

## 2020-11-17 MED ORDER — ATORVASTATIN CALCIUM 40 MG PO TABS: 40.0000 mg | ORAL_TABLET | Freq: Every day | ORAL | 3 refills | Status: DC

## 2020-11-17 MED ORDER — LOSARTAN POTASSIUM-HCTZ 100-12.5 MG PO TABS: 1.0000 | ORAL_TABLET | Freq: Every day | ORAL | 3 refills | Status: DC

## 2020-11-17 MED ORDER — CLOPIDOGREL BISULFATE 75 MG PO TABS: 75.0000 mg | ORAL_TABLET | Freq: Every day | ORAL | 0 refills | Status: AC

## 2020-11-17 MED ORDER — AMLODIPINE BESYLATE 10 MG PO TABS: 10.0000 mg | ORAL_TABLET | Freq: Every day | ORAL | 3 refills | Status: AC

## 2020-11-17 NOTE — Progress Notes (Signed)
Pt's IV removed; site is clean, dry and intact. Discharge instructions were reviewed with pt; voiced understanding. Personal belongings were packed by pt and friend. Pt. Received rolling walker. Pt. transported via wheelchair to private vehicle driven by friend.

## 2020-11-17 NOTE — Progress Notes (Signed)
STROKE TEAM PROGRESS NOTE   INTERVAL HISTORY Patient is sitting up in bed.  She has no new complaints.  Vital signs are stable.  Neurological exam is unchanged.  Physical therapy recommends home health.  Patient wants to go home.  Vitals:   11/16/20 2009 11/16/20 2307 11/17/20 0337 11/17/20 0725  BP: (!) 149/89 (!) 133/102 (!) 137/94 (!) 141/98  Pulse: 88 96 87 86  Resp: 18 16 16 14   Temp: 97.6 F (36.4 C) 98.6 F (37 C) 98.7 F (37.1 C) 98 F (36.7 C)  TempSrc: Oral Oral Oral Oral  SpO2: 100% 100% 99% 99%  Weight:      Height:       CBC:  Recent Labs  Lab 11/16/20 0323 11/17/20 0237  WBC 8.4 7.6  NEUTROABS 5.4 3.6  HGB 14.2 13.7  HCT 44.2 42.4  MCV 85.8 85.8  PLT 235 226   Basic Metabolic Panel:  Recent Labs  Lab 11/16/20 0323 11/17/20 0237  NA 137 135  K 3.7 4.3  CL 102 99  CO2 23 25  GLUCOSE 123* 106*  BUN 18 26*  CREATININE 1.10* 1.26*  CALCIUM 9.5 9.2  MG 2.0 2.0  PHOS 4.4  --    Lipid Panel:  Recent Labs  Lab 11/16/20 0323  CHOL 177  TRIG 119  HDL 34*  CHOLHDL 5.2  VLDL 24  LDLCALC 11/18/20*   HgbA1c:  Recent Labs  Lab 11/16/20 0323  HGBA1C 5.5   Urine Drug Screen:  Recent Labs  Lab 11/15/20 1316  LABOPIA NONE DETECTED  COCAINSCRNUR POSITIVE*  LABBENZ NONE DETECTED  AMPHETMU NONE DETECTED  THCU NONE DETECTED  LABBARB NONE DETECTED    Alcohol Level No results for input(s): ETH in the last 168 hours.  IMAGING past 24 hours CT HEAD WO CONTRAST  Result Date: 11/16/2020 CLINICAL DATA:  Stroke, follow-up, headache EXAM: CT HEAD WITHOUT CONTRAST TECHNIQUE: Contiguous axial images were obtained from the base of the skull through the vertex without intravenous contrast. COMPARISON:  11/15/2020 FINDINGS: Brain: Evolving large left cerebellar infarction is again identified. There is similar effacement of the fourth ventricle without hydrocephalus. Mild ascending transtentorial herniation and mild crowding at the foramen magnum are unchanged.  There is no acute intracranial hemorrhage. Chronic right parietal infarct. Stable chronic microvascular ischemic changes. Vascular: No new finding. Skull: Calvarium is unremarkable. Sinuses/Orbits: No acute finding. Other: None. IMPRESSION: Evolving acute large left cerebellar infarction. Similar mass effect. No hydrocephalus or hemorrhage. Electronically Signed   By: 11/17/2020 M.D.   On: 11/16/2020 11:15    PHYSICAL EXAM    Pleasant middle-age obese African-American lady not in distress. . Afebrile. Head is nontraumatic. Neck is supple without bruit.    Cardiac exam no murmur or gallop. Lungs are clear to auscultation. Distal pulses are well felt. Neurological Exam ;  Awake  Alert oriented x 3. Normal speech and language.eye movements full without nystagmus.fundi were not visualized. Vision acuity and fields appear normal. Hearing is normal. Palatal movements are normal. Face symmetric. Tongue midline. Normal strength, tone, reflexes and coordination. Normal sensation. Gait deferred.  ASSESSMENT/PLAN [name] is a 58 y.o. female with history of essential hypertension, tobacco abuse, and obesity presenting with 6d hx headache and dizziness and 1 episode of slurred speech.   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 Patchy periventricular small vessel disease. Posterior R  centrum semiovale hypoattenuation. Trace calcification  MRI w/w/o acute inferior L PCA infarct w/ edema and petechial hemorrhage. Small R lateral cerebellar infarct. Mass effect 4th ventricle. Moderate small vessel disease throughout  MRA head Unremarkable   MRA neck L VA origin occlusion. No flow seen L PICA  Repeat CT head evolving L cerebellar infarct w/ stable mass effect  2D Echo EF 60-65%. No source of embolus   LDL 119  HgbA1c 5.5  VTE prophylaxis - Lovenox 40 mg sq daily added  No antithrombotic prior to  admission, now on aspirin 81 mg daily. Add plavix. Continue DAPT  X 3 MONTHS the aspirin alone   Therapy recommendations:  HH PT, HH OT  Disposition:  Pending   Hypertensive Emergency  BP as high as 22/172 on arrival . Permissive hypertension (OK if < 220/120) but gradually normalize in 5-7 days . Long-term BP goal normotensive  Hyperlipidemia  Home meds:  No statin   Now on lipitor 40  LDL 119, goal < 70  Continue statin at discharge  Other Stroke Risk Factors  Cigarette smoker, advised to stop smoking  Substance abuse - UDS:  Cocaine POSITIVE. Patient advised to stop using due to stroke risk.  Obesity, Body mass index is 37.08 kg/m., BMI >/= 30 associated with increased stroke risk, recommend weight loss, diet and exercise as appropriate   Other Active Problems  CKD stage III  Hospital day # 2 Patient again counseled to quit using cocaine and smoking and to be compliant with medications and aggressive risk factor modification.  Stroke team will sign off.  Follow-up as an outpatient stroke clinic in 6 weeks. Delia Heady, MD   To contact Stroke Continuity provider, please refer to [email or web address]. After hours, contact General Neurology

## 2020-11-17 NOTE — TOC Initial Note (Signed)
Transition of Care [facility]) - Initial/Assessment Note    Patient Details  Name: Deborah Mcmahon MRN: [id number] Date of Birth: 12-08-1960  Transition [facility] CM/SW Contact:    Gala Lewandowsky, RN Phone Number: 11/17/2020, 11:33 AM  Clinical Narrative: Case Manager spoke with patient regarding home health services. Patient is from home and will have her girlfriend stay with her for the 24 hour supervision. Patient needs a rolling walker-paperwork signed from [employer]. RW to be taken to the room.[facility] will accept the patient for home health services; however it will be a delay until Tuesday. MD and patient is aware. Orders may need to be signed by [facility]. Patient states she has a primary care provider at [facility]. Unable to give me the providers name. No further needs from Case Manager.     Expected Discharge Plan: Home w Home Health Services Barriers to Discharge: Barriers Resolved   Patient Goals and CMS Choice Patient states their goals for this hospitalization and ongoing recovery are:: to return home CMS Medicare.gov Compare Post Acute Care list provided to:: Patient Choice offered to / list presented to : Patient  Expected Discharge Plan and Services Expected Discharge Plan: Home w Home Health Services In-house Referral: NA Discharge Planning Services: CM Consult Post Acute Care Choice: Home Health,Durable Medical Equipment Living arrangements for the past 2 months: Single Family Home Expected Discharge Date: 11/17/20               DME Arranged: Dan Humphreys rolling DME Agency: [employer] (filled out paperwork from [employer])     Representative spoke with at DME Agency: Paperwork filled out. HH Arranged: PT HH Agency: [facility] Date HH Agency Contacted: 11/17/20 Time HH Agency Contacted: 1132 Representative spoke with at [facility] Agency: Amy  Prior Living Arrangements/Services Living arrangements for the past 2 months: Single  Family Home Lives with:: Self Patient language and need for interpreter reviewed:: Yes Do you feel safe going back to the place where you live?: Yes      Need for Family Participation in Patient Care: Yes (Comment) Care giver support system in place?: Yes (comment) Current home services: DME Criminal Activity/Legal Involvement Pertinent to Current Situation/Hospitalization: No - Comment as needed  Activities of Daily Living      Permission Sought/Granted Permission sought to share information with : Case Manager,Facility Contact Representative,Family Supports Permission granted to share information with : No     Permission granted to share info w AGENCY: [facility]        Emotional Assessment Appearance:: Appears stated age Attitude/Demeanor/Rapport: Engaged Affect (typically observed): Accepting Orientation: : Oriented to Self,Oriented to Place,Oriented to  Time,Oriented to Situation Alcohol / Substance Use: Not Applicable Psych Involvement: No (comment)  Admission diagnosis:  Bad headache [R51.9] CVA (cerebral vascular accident) (HCC) [I63.9] Cerebrovascular accident (CVA), unspecified mechanism (HCC) [I63.9] Patient Active Problem List   Diagnosis Date Noted  . Substance use 11/16/2020  . CVA (cerebral vascular accident) (HCC) 11/15/2020  . Tobacco abuse 11/15/2020  . CKD (chronic kidney disease), stage III (HCC) 11/15/2020  . Essential hypertension 12/10/2018   PCP:  Patient, No Pcp Per Pharmacy:   [id number] - [place], [place] - [address] [place] [address] Phone: [phone] Fax: [phone]    Readmission Risk Interventions No flowsheet data found.

## 2020-11-17 NOTE — Progress Notes (Signed)
Physical Therapy Treatment Patient Details Name: Deborah Mcmahon MRN: [id number] DOB: 1961-11-06 Today's Date: 11/17/2020    History of Present Illness 59 y.o. female with hx of tobacco abuse, obesity, poorly controlled essential hypertension, who presented to the hospital on 11/15/2020 secondary to headache, dizziness, and elevated blood pressure at home. Patient reports not taking blood pressure medication x2 weeks. MRI (+) acute L cerebellar CVA.    PT Comments    Pt tolerates treatment well, improved balance and gait quality with UE support of cane. Pt is able to progress to more dynamic gait tasks including changing direction quickly without loss of balance. Pt will benefit from continued acute PT POC to improve dynamic gait and balance and to restore independence.   Follow Up Recommendations  Home health PT;Supervision/Assistance - 24 hour     Equipment Recommendations  None recommended by PT (pt owns cane)    Recommendations for Other Services       Precautions / Restrictions Precautions Precautions: Fall Restrictions Weight Bearing Restrictions: No    Mobility  Bed Mobility Overal bed mobility: Modified Independent                Transfers Overall transfer level: Needs assistance Equipment used: Straight cane Transfers: Sit to/from Stand Sit to Stand: Supervision            Ambulation/Gait Ambulation/Gait assistance: Supervision Gait Distance (Feet): 200 Feet Assistive device: Straight cane Gait Pattern/deviations: Step-through pattern Gait velocity: reduced Gait velocity interpretation: <1.8 ft/sec, indicate of risk for recurrent falls General Gait Details: slight increase in lateral sway but improved compared to previous session. Pt able to change direction and turn without loss of balance   Stairs             Wheelchair Mobility    Modified Rankin (Stroke Patients Only) Modified Rankin (Stroke Patients Only) Pre-Morbid Rankin Score:  No symptoms Modified Rankin: Moderately severe disability     Balance Overall balance assessment: Needs assistance Sitting-balance support: No upper extremity supported;Feet supported Sitting balance-Leahy Scale: Good     Standing balance support: During functional activity;No upper extremity supported Standing balance-Leahy Scale: Good Standing balance comment: close supervision                            Cognition Arousal/Alertness: Awake/alert Behavior During Therapy: WFL for tasks assessed/performed Overall Cognitive Status: Within Functional Limits for tasks assessed                                        Exercises      General Comments General comments (skin integrity, edema, etc.): VSS on RA      Pertinent Vitals/Pain Pain Assessment: No/denies pain    Home Living                      Prior Function            PT Goals (current goals can now be found in the care plan section) Acute Rehab PT Goals Patient Stated Goal: To return home. Progress towards PT goals: Progressing toward goals    Frequency    Min 4X/week      PT Plan Current plan remains appropriate    Co-evaluation              AM-PAC PT "6 Clicks" Mobility   Outcome Measure  Help needed turning from your back to your side while in a flat bed without using bedrails?: None Help needed moving from lying on your back to sitting on the side of a flat bed without using bedrails?: None Help needed moving to and from a bed to a chair (including a wheelchair)?: A Little Help needed standing up from a chair using your arms (e.g., wheelchair or bedside chair)?: A Little Help needed to walk in hospital room?: A Little Help needed climbing 3-5 steps with a railing? : A Little 6 Click Score: 20    End of Session   Activity Tolerance: Patient tolerated treatment well Patient left: in bed;with call bell/phone within reach;with family/visitor present Nurse  Communication: Mobility status PT Visit Diagnosis: Unsteadiness on feet (R26.81)     Time: 6256-3893 PT Time Calculation (min) (ACUTE ONLY): 8 min  Charges:  $Gait Training: 8-22 mins                     Arlyss Gandy, PT, DPT Acute Rehabilitation Pager: [phone]    Arlyss Gandy 11/17/2020, 11:15 AM

## 2020-11-17 NOTE — Discharge Summary (Signed)
Physician Discharge Summary   Deborah Mcmahon YQM:[id number]:[id number] DOB: [birth year] DOA: 11/14/2020  PCP: Patient, No Pcp Per  Admit date: 11/14/2020 Discharge date: 11/17/2020  Admitted From: home Disposition:  home Discharging physician: Deborah Chamberavid Shanyla Marconi, MD  Recommendations for Outpatient Follow-up:  1. Follow-up with neurology 2. Patient encouraged to remain compliant with blood pressure medications.  May need blood pressure med adjustment  Home Health: Physical therapy  Patient discharged to home in Discharge Condition: stable CODE STATUS: Full Diet recommendation:  Diet Orders (From admission, onward)    Start     Ordered   11/17/20 0000  Diet - low sodium heart healthy        11/17/20 1042   11/15/20 1806  Diet Heart Room service appropriate? Yes; Fluid consistency: Thin  Diet effective now       Question Answer Comment  Room service appropriate? Yes   Fluid consistency: Thin      11/15/20 1805          Hospital Course: Deborah MoodBeverly R Brownis a 59 y.o.femalewith hx of tobacco abuse, obesity, essential hypertension,who presentedto the hospital on 11/15/2020 secondary to headache, dizziness, and elevated blood pressure at home.  Patient was evaluated in the ED 2 days PTA, 11/13/2020, for persistent throbbing headache. At that time her blood pressure was gently elevated, 220/110, and she had been out of her blood pressure medications for over 2 weeks. She was prescribed losartan/HCTZ, declined ED evaluation and discharged home.   On initial presentation to the ED, she says she has been dizzy for the past 5 days, not feeling well. She has had associated dizziness and nausea. Apparently she was seen by urgent care yesterday and advised that if her symptoms persisted, to be seen in the ED.  * CVA (cerebral vascular accident) (HCC) - acute left cerebellar infarct seen on CT; repeat CT am of 12/23 reveals ongoing evolving infarct; no hemorrhagic transformation  - neuro  following, appreciate assistance -Continue statin -asa and plavix for 3 months then aspirin alone; discussed with patient prior to d/c  Substance use -UDS positive for cocaine.  Likely a contributing factor to her stroke as well as uncontrolled high blood pressure  CKD (chronic kidney disease), stage III (HCC) - will avoid nephrotoxic agents as able  Tobacco abuse -Smoking cessation recommended -Continue patch as needed  Essential hypertension -Home meds refilled at discharge   The patient's chronic medical conditions were treated accordingly per the patient's home medication regimen except as noted.  On day of discharge, patient was felt deemed stable for discharge. Patient/family member advised to call PCP or come back to ER if needed.   Principal Diagnosis: CVA (cerebral vascular accident) [facility](HCC)  Discharge Diagnoses: Active Hospital Problems   Diagnosis Date Noted   CVA (cerebral vascular accident) (HCC) 11/15/2020    Priority: High   Substance use 11/16/2020   Tobacco abuse 11/15/2020   CKD (chronic kidney disease), stage III (HCC) 11/15/2020   Essential hypertension 12/10/2018    Resolved Hospital Problems  No resolved problems to display.    Discharge Instructions    Ambulatory referral to Neurology   Complete by: As directed    [facility] with Deborah AustinJessica McCue, NP in about 4 weeks. If not available, consider Dr. Delia HeadyPramod Mcmahon, Dr. Jamelle RushingVikram Mcmahon, or Dr. Naomie DeanAntonia Mcmahon.   Diet - low sodium heart healthy   Complete by: As directed    Face-to-face encounter (required for Medicare/Medicaid patients)   Complete by:  As directed    I Deborah Chamber certify that this patient is under my care and that I, or a nurse practitioner or physician's assistant working with me, had a face-to-face encounter that meets the physician face-to-face encounter requirements with this patient on 11/17/2020. The encounter with the patient was  in whole, or in part for the following medical condition(s) which is the primary reason for home health care (List medical condition): acute CVA   The encounter with the patient was in whole, or in part, for the following medical condition, which is the primary reason for home health care: acute CVA   I certify that, based on my findings, the following services are medically necessary home health services: Physical therapy   Reason for Medically Necessary Home Health Services:  Therapy- Home Adaptation to Facilitate Safety Therapy- Therapeutic Exercises to Increase Strength and Endurance     My clinical findings support the need for the above services: Unsafe ambulation due to balance issues   Further, I certify that my clinical findings support that this patient is homebound due to: Unsafe ambulation due to balance issues   For home use only DME 4 wheeled rolling walker with seat   Complete by: As directed    Patient needs a walker to treat with the following condition: CVA (cerebral vascular accident) [facility])   Home Health   Complete by: As directed    To provide the following care/treatments: PT   Increase activity slowly   Complete by: As directed      Allergies as of 11/17/2020   No Known Allergies     Medication List    STOP taking these medications   cyclobenzaprine 5 MG tablet Commonly known as: FLEXERIL     TAKE these medications   albuterol 108 (90 Base) MCG/ACT inhaler Commonly known as: VENTOLIN HFA Inhale 2 puffs into the lungs every 6 (six) hours as needed for wheezing or shortness of breath.   amLODipine 10 MG tablet Commonly known as: NORVASC Take 1 tablet (10 mg total) by mouth daily.   aspirin 81 MG EC tablet Take 1 tablet (81 mg total) by mouth daily. Swallow whole.   atorvastatin 40 MG tablet Commonly known as: LIPITOR Take 1 tablet (40 mg total) by mouth daily.   clopidogrel 75 MG tablet Commonly known as: PLAVIX Take 1 tablet (75 mg total) by mouth  daily.   losartan-hydrochlorothiazide 100-12.5 MG tablet Commonly known as: HYZAAR Take 1 tablet by mouth daily.   methocarbamol 750 MG tablet Commonly known as: ROBAXIN Take 750 mg by mouth 2 (two) times daily as needed for muscle spasms.            Durable Medical Equipment  (From admission, onward)         Start     Ordered   11/17/20 0000  For home use only DME 4 wheeled rolling walker with seat       Question:  Patient needs a walker to treat with the following condition  Answer:  CVA (cerebral vascular accident) [facility])   11/17/20 1123          Follow-up Information    [facility] Follow up in 4 week(s).   Specialty: Neurology Why: stroke clinic. office will call with appt date and time.  Contact information: [address] [place] [address] [phone]       [facility] Follow up.   Specialty: Home Health Services Why: Physical Therapy-services will be  delayed until Tuesday 11-21-20 office will call the patient for services.  Contact information: [address] [place] [address] [phone]        [facility] Follow up.   Why: Rolling Walker- to be delivered to the room  Contact information: [address] [place] [address] [phone]              No Known Allergies  Consultations: neuro  Discharge Exam: BP (!) 141/98 (BP Location: Right Arm)    Pulse 86    Temp 98 F (36.7 C) (Oral)    Resp 14    Ht 5\' 4"  (1.626 m)    Wt 98 kg    SpO2 99%    BMI 37.08 kg/m  General appearance: Pleasant adult woman lying in bed in no distress Head: Normocephalic, without obvious abnormality, atraumatic Eyes: EOMI Lungs: clear to auscultation bilaterally Heart: regular rate and rhythm and S1, S2 normal Abdomen: normal findings: bowel sounds normal and soft, non-tender Extremities: No edema Skin: mobility and turgor normal Neurologic: No focal weakness, gait deferred.  Intention tremor with left hand FTN but no true dysmetria (touched finger to finger properly)  The results of significant diagnostics from this hospitalization (including imaging, microbiology, ancillary and laboratory) are listed below for reference.   Microbiology: Recent Results (from the past 240 hour(s))  SARS CORONAVIRUS 2 (TAT 6-24 HRS) Nasopharyngeal Nasopharyngeal Swab     Status: None   Collection Time: 11/13/20  1:42 PM   Specimen: Nasopharyngeal Swab  Result Value Ref Range Status   SARS Coronavirus 2 NEGATIVE NEGATIVE Final    Comment: (NOTE) SARS-CoV-2 target nucleic acids are NOT DETECTED.  The SARS-CoV-2 RNA is generally detectable in upper and lower respiratory specimens during the acute phase of infection. Negative results do not preclude SARS-CoV-2 infection, do not rule out co-infections with other pathogens, and should not be used as the sole basis for treatment or other patient management decisions. Negative results must be combined with clinical observations, patient history, and epidemiological information. The expected result is Negative.  Fact Sheet for Patients: 11/15/20  Fact Sheet for Healthcare Providers: HairSlick.no  This test is not yet approved or cleared by the [place] FDA and  has been authorized for detection and/or diagnosis of SARS-CoV-2 by FDA under an Emergency Use Authorization (EUA). This EUA will remain  in effect (meaning this test can be used) for the duration of the COVID-19 declaration under Se ction 564(b)(1) of the Act, 21 U.S.C. section 360bbb-3(b)(1), unless the authorization is terminated or revoked sooner.  Performed at [facility], [address], [place], [place]   Resp Panel by RT-PCR (Flu A&B, Covid) Nasopharyngeal Swab     Status: None   Collection Time: 11/15/20  9:40 AM   Specimen: Nasopharyngeal Swab; Nasopharyngeal(NP) swabs in  vial transport medium  Result Value Ref Range Status   SARS Coronavirus 2 by RT PCR NEGATIVE NEGATIVE Final    Comment: (NOTE) SARS-CoV-2 target nucleic acids are NOT DETECTED.  The SARS-CoV-2 RNA is generally detectable in upper respiratory specimens during the acute phase of infection. The lowest concentration of SARS-CoV-2 viral copies this assay can detect is 138 copies/mL. A negative result does not preclude SARS-Cov-2 infection and should not be used as the sole basis for treatment or other patient management decisions. A negative result may occur with  improper specimen collection/handling, submission of specimen other than nasopharyngeal swab, presence of viral mutation(s) within the areas targeted  by this assay, and inadequate number of viral copies(<138 copies/mL). A negative result must be combined with clinical observations, patient history, and epidemiological information. The expected result is Negative.  Fact Sheet for Patients:  BloggerCourse.com  Fact Sheet for Healthcare Providers:  SeriousBroker.it  This test is no t yet approved or cleared by the [place] FDA and  has been authorized for detection and/or diagnosis of SARS-CoV-2 by FDA under an Emergency Use Authorization (EUA). This EUA will remain  in effect (meaning this test can be used) for the duration of the COVID-19 declaration under Section 564(b)(1) of the Act, 21 U.S.C.section 360bbb-3(b)(1), unless the authorization is terminated  or revoked sooner.       Influenza A by PCR NEGATIVE NEGATIVE Final   Influenza B by PCR NEGATIVE NEGATIVE Final    Comment: (NOTE) The Xpert Xpress SARS-CoV-2/FLU/RSV plus assay is intended as an aid in the diagnosis of influenza from Nasopharyngeal swab specimens and should not be used as a sole basis for treatment. Nasal washings and aspirates are unacceptable for Xpert Xpress SARS-CoV-2/FLU/RSV testing.  Fact  Sheet for Patients: BloggerCourse.com  Fact Sheet for Healthcare Providers: SeriousBroker.it  This test is not yet approved or cleared by the [place] FDA and has been authorized for detection and/or diagnosis of SARS-CoV-2 by FDA under an Emergency Use Authorization (EUA). This EUA will remain in effect (meaning this test can be used) for the duration of the COVID-19 declaration under Section 564(b)(1) of the Act, 21 U.S.C. section 360bbb-3(b)(1), unless the authorization is terminated or revoked.  Performed at [facility], [address] [place], [place] [address]      Labs: BNP (last 3 results) No results for input(s): BNP in the last 8760 hours. Basic Metabolic Panel: Recent Labs  Lab 11/15/20 0754 11/16/20 0323 11/17/20 0237  NA 137 137 135  K 3.9 3.7 4.3  CL 102 102 99  CO2 24 23 25   GLUCOSE 116* 123* 106*  BUN 18 18 26*  CREATININE 1.21* 1.10* 1.26*  CALCIUM 9.5 9.5 9.2  MG  --  2.0 2.0  PHOS  --  4.4  --    Liver Function Tests: Recent Labs  Lab 11/15/20 0754  AST 35  ALT 44  ALKPHOS 60  BILITOT 0.4  PROT 7.0  ALBUMIN 3.1*   No results for input(s): LIPASE, AMYLASE in the last 168 hours. No results for input(s): AMMONIA in the last 168 hours. CBC: Recent Labs  Lab 11/15/20 0754 11/16/20 0323 11/17/20 0237  WBC 8.0 8.4 7.6  NEUTROABS 4.3 5.4 3.6  HGB 14.1 14.2 13.7  HCT 44.6 44.2 42.4  MCV 87.1 85.8 85.8  PLT 242 235 226   Cardiac Enzymes: No results for input(s): CKTOTAL, CKMB, CKMBINDEX, TROPONINI in the last 168 hours. BNP: Invalid input(s): POCBNP CBG: No results for input(s): GLUCAP in the last 168 hours. D-Dimer No results for input(s): DDIMER in the last 72 hours. Hgb A1c Recent Labs    11/16/20 0323  HGBA1C 5.5   Lipid Profile Recent Labs    11/16/20 0323  CHOL 177  HDL 34*  LDLCALC 119*  TRIG 119  CHOLHDL 5.2   Thyroid function studies No results for  input(s): TSH, T4TOTAL, T3FREE, THYROIDAB in the last 72 hours.  Invalid input(s): FREET3 Anemia work up No results for input(s): VITAMINB12, FOLATE, FERRITIN, TIBC, IRON, RETICCTPCT in the last 72 hours. Urinalysis    Component Value Date/Time   COLORURINE YELLOW 11/06/2009 1109  APPEARANCEUR CLEAR 11/06/2009 1109   LABSPEC 1.025 11/13/2020 1348   PHURINE 5.5 11/13/2020 1348   GLUCOSEU NEGATIVE 11/13/2020 1348   HGBUR TRACE (A) 11/13/2020 1348   BILIRUBINUR NEGATIVE 11/13/2020 1348   KETONESUR NEGATIVE 11/13/2020 1348   PROTEINUR NEGATIVE 11/13/2020 1348   UROBILINOGEN 0.2 11/13/2020 1348   NITRITE NEGATIVE 11/13/2020 1348   LEUKOCYTESUR NEGATIVE 11/13/2020 1348   Sepsis Labs Invalid input(s): PROCALCITONIN,  WBC,  LACTICIDVEN Microbiology Recent Results (from the past 240 hour(s))  SARS CORONAVIRUS 2 (TAT 6-24 HRS) Nasopharyngeal Nasopharyngeal Swab     Status: None   Collection Time: 11/13/20  1:42 PM   Specimen: Nasopharyngeal Swab  Result Value Ref Range Status   SARS Coronavirus 2 NEGATIVE NEGATIVE Final    Comment: (NOTE) SARS-CoV-2 target nucleic acids are NOT DETECTED.  The SARS-CoV-2 RNA is generally detectable in upper and lower respiratory specimens during the acute phase of infection. Negative results do not preclude SARS-CoV-2 infection, do not rule out co-infections with other pathogens, and should not be used as the sole basis for treatment or other patient management decisions. Negative results must be combined with clinical observations, patient history, and epidemiological information. The expected result is Negative.  Fact Sheet for Patients: HairSlick.no  Fact Sheet for Healthcare Providers: quierodirigir.com  This test is not yet approved or cleared by the [place] FDA and  has been authorized for detection and/or diagnosis of SARS-CoV-2 by FDA under an Emergency Use Authorization (EUA).  This EUA will remain  in effect (meaning this test can be used) for the duration of the COVID-19 declaration under Se ction 564(b)(1) of the Act, 21 U.S.C. section 360bbb-3(b)(1), unless the authorization is terminated or revoked sooner.  Performed at [facility], [address], [place], [place] [address]   Resp Panel by RT-PCR (Flu A&B, Covid) Nasopharyngeal Swab     Status: None   Collection Time: 11/15/20  9:40 AM   Specimen: Nasopharyngeal Swab; Nasopharyngeal(NP) swabs in vial transport medium  Result Value Ref Range Status   SARS Coronavirus 2 by RT PCR NEGATIVE NEGATIVE Final    Comment: (NOTE) SARS-CoV-2 target nucleic acids are NOT DETECTED.  The SARS-CoV-2 RNA is generally detectable in upper respiratory specimens during the acute phase of infection. The lowest concentration of SARS-CoV-2 viral copies this assay can detect is 138 copies/mL. A negative result does not preclude SARS-Cov-2 infection and should not be used as the sole basis for treatment or other patient management decisions. A negative result may occur with  improper specimen collection/handling, submission of specimen other than nasopharyngeal swab, presence of viral mutation(s) within the areas targeted by this assay, and inadequate number of viral copies(<138 copies/mL). A negative result must be combined with clinical observations, patient history, and epidemiological information. The expected result is Negative.  Fact Sheet for Patients:  BloggerCourse.com  Fact Sheet for Healthcare Providers:  SeriousBroker.it  This test is no t yet approved or cleared by the [place] FDA and  has been authorized for detection and/or diagnosis of SARS-CoV-2 by FDA under an Emergency Use Authorization (EUA). This EUA will remain  in effect (meaning this test can be used) for the duration of the COVID-19 declaration under Section 564(b)(1) of the Act,  21 U.S.C.section 360bbb-3(b)(1), unless the authorization is terminated  or revoked sooner.       Influenza A by PCR NEGATIVE NEGATIVE Final   Influenza B by PCR NEGATIVE NEGATIVE Final    Comment: (NOTE) The Xpert Xpress SARS-CoV-2/FLU/RSV  plus assay is intended as an aid in the diagnosis of influenza from Nasopharyngeal swab specimens and should not be used as a sole basis for treatment. Nasal washings and aspirates are unacceptable for Xpert Xpress SARS-CoV-2/FLU/RSV testing.  Fact Sheet for Patients: BloggerCourse.com  Fact Sheet for Healthcare Providers: SeriousBroker.it  This test is not yet approved or cleared by the [place] FDA and has been authorized for detection and/or diagnosis of SARS-CoV-2 by FDA under an Emergency Use Authorization (EUA). This EUA will remain in effect (meaning this test can be used) for the duration of the COVID-19 declaration under Section 564(b)(1) of the Act, 21 U.S.C. section 360bbb-3(b)(1), unless the authorization is terminated or revoked.  Performed at [facility], [address] [place], [place] [address]     Procedures/Studies: CT HEAD WO CONTRAST  Result Date: 11/16/2020 CLINICAL DATA:  Stroke, follow-up, headache EXAM: CT HEAD WITHOUT CONTRAST TECHNIQUE: Contiguous axial images were obtained from the base of the skull through the vertex without intravenous contrast. COMPARISON:  11/15/2020 FINDINGS: Brain: Evolving large left cerebellar infarction is again identified. There is similar effacement of the fourth ventricle without hydrocephalus. Mild ascending transtentorial herniation and mild crowding at the foramen magnum are unchanged. There is no acute intracranial hemorrhage. Chronic right parietal infarct. Stable chronic microvascular ischemic changes. Vascular: No new finding. Skull: Calvarium is unremarkable. Sinuses/Orbits: No acute finding. Other: None. IMPRESSION:  Evolving acute large left cerebellar infarction. Similar mass effect. No hydrocephalus or hemorrhage. Electronically Signed   By: Guadlupe Spanish M.D.   On: 11/16/2020 11:15   CT Head Wo Contrast  Result Date: 11/15/2020 CLINICAL DATA:  Dizziness EXAM: CT HEAD WITHOUT CONTRAST TECHNIQUE: Contiguous axial images were obtained from the base of the skull through the vertex without intravenous contrast. COMPARISON:  None. FINDINGS: Brain: Ventricles and sulci overall are normal in size and configuration. There is vasogenic edema throughout much of the mid to lower left cerebellar hemisphere which impresses upon the posterior leftward aspect of the fourth ventricle as well as impressing upon the leftward aspect of the lower pons and medulla. Elsewhere, there is patchy small vessel disease in the centra semiovale bilaterally. There is decreased attenuation in the posterior right centrum semiovale extending to the gray-white junction which may represent a recent infarct in this area. There is no hemorrhage, extra-axial fluid collection, midline shift. A well-defined mass is not seen, although there is concern for mass with surrounding edema in the left cerebellum. Vascular: No hyperdense vessel. There is calcification in each carotid siphon region. Skull: The bony calvarium appears intact. Sinuses/Orbits: Paranasal sinuses are clear. Orbits appear symmetric bilaterally. Other: Mastoid air cells are clear. IMPRESSION: Suspected vasogenic edema in the inferior to mid left cerebellum with impression on the leftward aspect of the lower pons and medulla as well as impression on the leftward posterior aspect of the fourth ventricle. While this area potentially could represent acute infarct, this appearance is more concerning for potential mass with edema surrounding. This appearance warrants correlation chronic MR pre and post-contrast to further evaluate. If there is a contraindication to MR, CT with intravenous contrast  would be warranted in this circumstance. Elsewhere there is patchy periventricular small vessel disease. Decreased attenuation in the posterior right centrum semiovale with extension to the gray-white junction may represent an acute predominantly white matter infarct in this area. No acute hemorrhage.  No extra-axial fluid or midline shift. There are foci of arterial vascular calcification. These results were called by telephone at the  time of interpretation on 11/15/2020 at 8:46 am to provider [facility] , who verbally acknowledged these results. Electronically Signed   By: Bretta Bang III M.D.   On: 11/15/2020 08:46   MR ANGIO HEAD WO CONTRAST  Result Date: 11/15/2020 CLINICAL DATA:  Chronic headache. Dizziness. Neurological deficit with suspected stroke. EXAM: MRI HEAD WITHOUT AND WITH CONTRAST MRA HEAD WITHOUT CONTRAST MRA NECK WITHOUT AND WITH CONTRAST TECHNIQUE: Multiplanar, multiecho pulse sequences of the brain and surrounding structures were obtained without and with intravenous contrast. Angiographic images of the Circle of Willis were obtained using MRA technique without intravenous contrast. Angiographic images of the neck were obtained using MRA technique without and with intravenous contrast. Carotid stenosis measurements (when applicable) are obtained utilizing NASCET criteria, using the distal internal carotid diameter as the denominator. CONTRAST:  10mL GADAVIST GADOBUTROL 1 MMOL/ML IV SOLN COMPARISON:  Head CT same day FINDINGS: MRI HEAD FINDINGS Brain: There is acute infarction affecting the inferior cerebellum on the left. There is swelling and petechial blood products. Small focus of acute infarction in the right lateral cerebellum. No involvement of the actual brainstem. Chronic small-vessel ischemic changes affect the pons. No acute infarction in the supratentorial brain. There are moderate chronic small-vessel ischemic changes of the cerebral hemispheric white matter. Old right  parietal subcortical infarction. No mass, hydrocephalus or extra-axial collection. No abnormal contrast enhancement. Vascular: Major vessels at the base of the brain show flow. Skull and upper cervical spine: Negative Sinuses/Orbits: Clear/normal Other: None MRA HEAD FINDINGS Both internal carotid arteries are patent through the skull base and siphon region. The anterior and middle cerebral vessels are patent without proximal stenosis, aneurysm or vascular malformation. No large or medium vessel anterior circulation branch vessel occlusion. Both vertebral arteries show flow at the foramen magnum. Both vertebral arteries reach the basilar. The basilar artery is small and shows stenosis in the midportion. The superior cerebellar and posterior cerebral arteries show flow. Patent posterior communicating arteries on both sides. No PICA flow is demonstrated on the left. Right PICA is patent. MRA NECK FINDINGS Both common carotid arteries widely patent to the bifurcation regions. Mild atherosclerotic irregularity at the bifurcations but no stenosis. Cervical internal carotid arteries widely patent. Dominant right vertebral artery is patent through the cervical region. The origin is not well seen because chest motion. 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 Vessel does show flow beyond that to the basilar. IMPRESSION: 1. Acute infarction affecting the inferior cerebellum on the left consistent with complete PICA distribution infarction. Swelling and petechial blood products. Small acute infarction in the lateral cerebellum on the right. Mass-effect upon the fourth ventricle but no obstructive hydrocephalus. 2. Moderate chronic small-vessel ischemic changes elsewhere throughout the brain. 3. No significant anterior circulation pathology on the MRA of the neck or intracranial MR angiography. 4. 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 There is reconstituted flow as a small vessel in the mid cervical region. Right PICA is patent. No flow seen in left PICA. Electronically Signed   By: Paulina Fusi M.D.   On: 11/15/2020 11:19   MR Angiogram Neck W or Wo Contrast  Result Date: 11/15/2020 CLINICAL DATA:  Chronic headache. Dizziness. Neurological deficit with suspected stroke. EXAM: MRI HEAD WITHOUT AND WITH CONTRAST MRA HEAD WITHOUT CONTRAST MRA NECK WITHOUT AND WITH CONTRAST TECHNIQUE: Multiplanar, multiecho pulse sequences of the brain and surrounding  structures were obtained without and with intravenous contrast. Angiographic images of the Circle of Willis were obtained using MRA technique without intravenous contrast. Angiographic images of the neck were obtained using MRA technique without and with intravenous contrast. Carotid stenosis measurements (when applicable) are obtained utilizing NASCET criteria, using the distal internal carotid diameter as the denominator. CONTRAST:  10mL GADAVIST GADOBUTROL 1 MMOL/ML IV SOLN COMPARISON:  Head CT same day FINDINGS: MRI HEAD FINDINGS Brain: There is acute infarction affecting the inferior cerebellum on the left. There is swelling and petechial blood products. Small focus of acute infarction in the right lateral cerebellum. No involvement of the actual brainstem. Chronic small-vessel ischemic changes affect the pons. No acute infarction in the supratentorial brain. There are moderate chronic small-vessel ischemic changes of the cerebral hemispheric white matter. Old right parietal subcortical infarction. No mass, hydrocephalus or extra-axial collection. No abnormal contrast enhancement. Vascular: Major vessels at the base of the brain show flow. Skull and upper cervical spine: Negative Sinuses/Orbits: Clear/normal Other: None MRA HEAD FINDINGS Both internal carotid arteries are patent through the skull base and siphon region. The anterior and middle cerebral  vessels are patent without proximal stenosis, aneurysm or vascular malformation. No large or medium vessel anterior circulation branch vessel occlusion. Both vertebral arteries show flow at the foramen magnum. Both vertebral arteries reach the basilar. The basilar artery is small and shows stenosis in the midportion. The superior cerebellar and posterior cerebral arteries show flow. Patent posterior communicating arteries on both sides. No PICA flow is demonstrated on the left. Right PICA is patent. MRA NECK FINDINGS Both common carotid arteries widely patent to the bifurcation regions. Mild atherosclerotic irregularity at the bifurcations but no stenosis. Cervical internal carotid arteries widely patent. Dominant right vertebral artery is patent through the cervical region. The origin is not well seen because chest motion. 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 Vessel does show flow beyond that to the basilar. IMPRESSION: 1. Acute infarction affecting the inferior cerebellum on the left consistent with complete PICA distribution infarction. Swelling and petechial blood products. Small acute infarction in the lateral cerebellum on the right. Mass-effect upon the fourth ventricle but no obstructive hydrocephalus. 2. Moderate chronic small-vessel ischemic changes elsewhere throughout the brain. 3. No significant anterior circulation pathology on the MRA of the neck or intracranial MR angiography. 4. 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 There is reconstituted flow as a small vessel in the mid cervical region. Right PICA is patent. No flow seen in left PICA. Electronically Signed   By: Paulina Fusi M.D.   On: 11/15/2020 11:19   MR Brain W and Wo Contrast  Result Date: 11/15/2020 CLINICAL DATA:  Chronic headache. Dizziness. Neurological deficit with suspected stroke. EXAM: MRI HEAD  WITHOUT AND WITH CONTRAST MRA HEAD WITHOUT CONTRAST MRA NECK WITHOUT AND WITH CONTRAST TECHNIQUE: Multiplanar, multiecho pulse sequences of the brain and surrounding structures were obtained without and with intravenous contrast. Angiographic images of the Circle of Willis were obtained using MRA technique without intravenous contrast. Angiographic images of the neck were obtained using MRA technique without and with intravenous contrast. Carotid stenosis measurements (when applicable) are obtained utilizing NASCET criteria, using the distal internal carotid diameter as the denominator. CONTRAST:  10mL GADAVIST GADOBUTROL 1 MMOL/ML IV SOLN COMPARISON:  Head CT same day FINDINGS: MRI HEAD FINDINGS Brain: There is acute infarction affecting the  inferior cerebellum on the left. There is swelling and petechial blood products. Small focus of acute infarction in the right lateral cerebellum. No involvement of the actual brainstem. Chronic small-vessel ischemic changes affect the pons. No acute infarction in the supratentorial brain. There are moderate chronic small-vessel ischemic changes of the cerebral hemispheric white matter. Old right parietal subcortical infarction. No mass, hydrocephalus or extra-axial collection. No abnormal contrast enhancement. Vascular: Major vessels at the base of the brain show flow. Skull and upper cervical spine: Negative Sinuses/Orbits: Clear/normal Other: None MRA HEAD FINDINGS Both internal carotid arteries are patent through the skull base and siphon region. The anterior and middle cerebral vessels are patent without proximal stenosis, aneurysm or vascular malformation. No large or medium vessel anterior circulation branch vessel occlusion. Both vertebral arteries show flow at the foramen magnum. Both vertebral arteries reach the basilar. The basilar artery is small and shows stenosis in the midportion. The superior cerebellar and posterior cerebral arteries show flow. Patent posterior  communicating arteries on both sides. No PICA flow is demonstrated on the left. Right PICA is patent. MRA NECK FINDINGS Both common carotid arteries widely patent to the bifurcation regions. Mild atherosclerotic irregularity at the bifurcations but no stenosis. Cervical internal carotid arteries widely patent. Dominant right vertebral artery is patent through the cervical region. The origin is not well seen because chest motion. 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 Vessel does show flow beyond that to the basilar. IMPRESSION: 1. Acute infarction affecting the inferior cerebellum on the left consistent with complete PICA distribution infarction. Swelling and petechial blood products. Small acute infarction in the lateral cerebellum on the right. Mass-effect upon the fourth ventricle but no obstructive hydrocephalus. 2. Moderate chronic small-vessel ischemic changes elsewhere throughout the brain. 3. No significant anterior circulation pathology on the MRA of the neck or intracranial MR angiography. 4. 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 There is reconstituted flow as a small vessel in the mid cervical region. Right PICA is patent. No flow seen in left PICA. Electronically Signed   By: Paulina Fusi M.D.   On: 11/15/2020 11:19   ECHOCARDIOGRAM COMPLETE  Result Date: 11/15/2020    ECHOCARDIOGRAM REPORT   Patient Name:   THRESIA RAMANATHAN Date of Exam: 11/15/2020 Medical Rec #:  [id number]       Height:       64.0 in Accession #:    [id number]      Weight:       216.0 lb Date of Birth:  Sep 11, 1961       BSA:          2.021 m Patient Age:    59 years        BP:           216/116 mmHg Patient Gender: F               HR:           95 bpm. Exam Location:  Inpatient Procedure: 2D Echo Indications:    Stroke 434.91 / I163.9  History:        Patient has no prior history of Echocardiogram  examinations.                 Stroke; Risk Factors:Current Smoker and Hypertension.  Sonographer:    Jeryl Columbia Referring Phys: [id number] Kieth Brightly MACNEIL IMPRESSIONS  1.  Left ventricular ejection fraction, by estimation, is 60 to 65%. The left ventricle has normal function. The left ventricle has no regional wall motion abnormalities. There is moderate concentric left ventricular hypertrophy. Left ventricular diastolic parameters are consistent with Grade I diastolic dysfunction (impaired relaxation).  2. Right ventricular systolic function is normal. The right ventricular size is normal. Tricuspid regurgitation signal is inadequate for assessing PA pressure.  3. The mitral valve is grossly normal. Trivial mitral valve regurgitation. No evidence of mitral stenosis.  4. The aortic valve is tricuspid. There is mild calcification of the aortic valve. There is mild thickening of the aortic valve. Aortic valve regurgitation is not visualized. Mild aortic valve sclerosis is present, with no evidence of aortic valve stenosis.  5. The inferior vena cava is normal in size with greater than 50% respiratory variability, suggesting right atrial pressure of 3 mmHg. Conclusion(s)/Recommendation(s): No intracardiac source of embolism detected on this transthoracic study. A transesophageal echocardiogram is recommended to exclude cardiac source of embolism if clinically indicated. FINDINGS  Left Ventricle: Left ventricular ejection fraction, by estimation, is 60 to 65%. The left ventricle has normal function. The left ventricle has no regional wall motion abnormalities. The left ventricular internal cavity size was normal in size. There is  moderate concentric left ventricular hypertrophy. Left ventricular diastolic parameters are consistent with Grade I diastolic dysfunction (impaired relaxation). Normal left ventricular filling pressure. Right Ventricle: The right ventricular size is normal. No increase in right ventricular  wall thickness. Right ventricular systolic function is normal. Tricuspid regurgitation signal is inadequate for assessing PA pressure. Left Atrium: Left atrial size was normal in size. Right Atrium: Right atrial size was normal in size. Pericardium: Trivial pericardial effusion is present. Mitral Valve: The mitral valve is grossly normal. Trivial mitral valve regurgitation. No evidence of mitral valve stenosis. Tricuspid Valve: The tricuspid valve is grossly normal. Tricuspid valve regurgitation is not demonstrated. No evidence of tricuspid stenosis. Aortic Valve: The aortic valve is tricuspid. There is mild calcification of the aortic valve. There is mild thickening of the aortic valve. Aortic valve regurgitation is not visualized. Mild aortic valve sclerosis is present, with no evidence of aortic valve stenosis. Pulmonic Valve: The pulmonic valve was grossly normal. Pulmonic valve regurgitation is not visualized. No evidence of pulmonic stenosis. Aorta: The aortic root is normal in size and structure. Venous: The right upper pulmonary vein is normal. The inferior vena cava is normal in size with greater than 50% respiratory variability, suggesting right atrial pressure of 3 mmHg. IAS/Shunts: The atrial septum is grossly normal.  LEFT VENTRICLE PLAX 2D LVIDd:         3.50 cm  Diastology LVIDs:         2.40 cm  LV e' medial:    4.35 cm/s LV PW:         1.70 cm  LV E/e' medial:  9.6 LV IVS:        1.40 cm  LV e' lateral:   5.44 cm/s LVOT diam:     2.00 cm  LV E/e' lateral: 7.6 LVOT Area:     3.14 cm  RIGHT VENTRICLE RV S prime:     25.90 cm/s TAPSE (M-mode): 3.0 cm LEFT ATRIUM             Index LA diam:        4.20 cm 2.08 cm/m LA Vol (A2C):   23.1 ml 11.43 ml/m LA Vol (A4C):   30.2 ml 14.94 ml/m LA Biplane Vol:  29.3 ml 14.49 ml/m   AORTA Ao Root diam: 2.80 cm MITRAL VALVE MV Area (PHT): 3.65 cm    SHUNTS MV Decel Time: 208 msec    Systemic Diam: 2.00 cm MV E velocity: 41.60 cm/s MV A velocity: 63.00 cm/s MV  E/A ratio:  0.66 Lennie Odor MD Electronically signed by Lennie Odor MD Signature Date/Time: 11/15/2020/3:08:34 PM    Final      Time coordinating discharge: Over 30 minutes    Deborah Chamber, MD  [facility] 11/17/2020, 2:38 PM

## 2020-11-20 ENCOUNTER — Other Ambulatory Visit: Payer: Self-pay

## 2020-11-20 ENCOUNTER — Ambulatory Visit (HOSPITAL_COMMUNITY): Payer: Medicare Other | Attending: Cardiology

## 2020-11-20 ENCOUNTER — Other Ambulatory Visit: Payer: Self-pay | Admitting: *Deleted

## 2020-11-20 DIAGNOSIS — R600 Localized edema: Secondary | ICD-10-CM | POA: Diagnosis present

## 2020-11-20 DIAGNOSIS — R0602 Shortness of breath: Secondary | ICD-10-CM | POA: Insufficient documentation

## 2020-11-20 LAB — ECHOCARDIOGRAM COMPLETE
Area-P 1/2: 3.17 cm2
S' Lateral: 1.7 cm

## 2020-11-20 NOTE — Progress Notes (Signed)
Patient ID: Deborah Mcmahon, female   DOB: June 05, 1961, 59 y.o.   MRN: [id number]  Patient did not consent to the use of definity.

## 2020-11-20 NOTE — Patient Outreach (Signed)
[facility]) Care Management  11/20/2020  Deborah Mcmahon 12/31/1960 [id number]   RED ON EMMI ALERT - Stroke Day # 1 Date: 12/26 Red Alert Reason: Not filled new prescriptions and Not scheduled follow up appointment   Outreach attempt #1, initially successful.  Identity verified.  This care manager introduced self and stated purpose of call.  [facility] care management services explained.    State she is feeling much better.  Attempted to complete assessment, review medications, and address EMMI alert, however state she wasn't at home and requested call back.  Call placed back, no answer, unable to leave voice message.     Plan: RN CM will send unsuccessful outreach letter and follow up within the next 3-4 business days.  Kemper Durie, [place], MSN [facility] Care Management  [facility] Manager [phone]

## 2020-11-23 ENCOUNTER — Other Ambulatory Visit: Payer: Self-pay | Admitting: *Deleted

## 2020-11-23 NOTE — Patient Outreach (Signed)
[facility]) Care Management  11/23/2020  Deborah Mcmahon 1961-03-20 [id number]   Outreach attempt #2 to member, unsuccessful, unable to leave message.  Will follow up within 3-4 business days.  Kemper Durie, [place], MSN [facility] Care Management  [facility] Manager [phone]

## 2020-11-28 ENCOUNTER — Other Ambulatory Visit: Payer: Self-pay | Admitting: *Deleted

## 2020-11-28 NOTE — Patient Outreach (Signed)
[facility]) Care Management  11/28/2020  Deborah Mcmahon 1961-10-06 [id number]   RED ON EMMI ALERT - Stroke Day # 1 Date: 12/26 Red Alert Reason: Not filled new prescriptions and Not scheduled follow up appointment  RED ON EMMI ALERT - Stroke Day # 9 Date: 11/27/2020 Red Alert Reason: Lost interest in things and Feeling sad/hopeless/anxious/empty   Outreach attempt #3, unsuccessful, unable to leave voice message as mailbox has not been set up.   Plan: RN CM will follow up with 4th and final attempt within the next 3 weeks.  If remain unsuccessful, will close case due to inability to establish contact.  Kemper Durie, [place], MSN [facility] Care Management  [facility] Manager [phone]

## 2020-11-30 ENCOUNTER — Ambulatory Visit: Payer: Self-pay | Admitting: *Deleted

## 2020-12-19 ENCOUNTER — Other Ambulatory Visit: Payer: Self-pay | Admitting: *Deleted

## 2020-12-19 NOTE — Patient Outreach (Signed)
[facility]) Care Management  12/19/2020  Deborah Mcmahon 08/12/1961 [id number]   RED ON EMMI ALERT- Stroke Day #1 Date:12/26 Red Alert Reason:Not filled new prescriptions and Not scheduled follow up appointment  RED ON EMMI ALERT - Stroke Day # 9 Date: 11/27/2020 Red Alert Reason: Lost interest in things and Feeling sad/hopeless/anxious/empty   Outreach attempt #4, successful.  Identity verified.  This care manager introduced self and stated purpose of call.  [facility] care management services explained.    Member report she is doing betters since discharge, mentally and physically.  Lives alone but able to perform ADL's independently.  State she does feel like she need additional help at times.  Has Medicaid, will discuss in home aide with PCP (follow up on 2/2).  Also has follow up with Neurology on 1/27, denies need for transportation.  Medication list reviewed, report compliance, denies any need for assistance.    Discussed decreasing risk of recurrent stroke, she does monitor blood pressure daily but does not record.  Advised to record and take trends to physicians in effort to accurately manage blood pressure.  She verbalizes understanding.  Denies any urgent concerns, encouraged to contact this care manager with questions. Will close case at this time as no further needs identified.  Kemper Durie, [place], MSN [facility] Care Management  [facility] Manager [phone]

## 2020-12-21 ENCOUNTER — Inpatient Hospital Stay: Payer: Medicare Other | Admitting: Adult Health

## 2020-12-21 NOTE — Progress Notes (Deleted)
[facility] [address] [place]. [place] [address] [phone]       HOSPITAL FOLLOW UP NOTE  [name] Date of Birth:  [birth year] Medical Record Number:  [id number]   Reason for Referral:  hospital stroke follow up    SUBJECTIVE:   CHIEF COMPLAINT:  No chief complaint on file.   HPI:   [name] is a 60 y.o. female with history of essential hypertension, tobacco abuse, and obesity who presented to [facility] ED on 11/14/2020 with 6d hx headache and dizziness and 1 episode of slurred speech.  Personally reviewed hospitalization pertinent progress notes, lab work and imaging with summary provided.  Evaluated by Dr. 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  Recommended DAPT for 3 months then aspirin alone.  History of HTN with hypertensive emergency upon arrival stabilize during admission with long-term BP goal normotensive range.  LDL 119 and initiate atorvastatin 40 mg daily.  No history or evidence of DM with A1c 5.5.  Other stroke risk factors include tobacco use, substance abuse (UDS + cocaine) and obesity.  Evaluated by therapies who recommended [facility] PT/OT and discharged home in stable condition.  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 Patchy periventricular small vessel disease. Posterior R centrum semiovale hypoattenuation. Trace calcification  MRI w/w/o acute inferior L PCA infarct w/ edema and petechial hemorrhage. Small R lateral cerebellar infarct. Mass effect 4th ventricle. Moderate small vessel disease throughout  MRA head Unremarkable   MRA neck L VA origin occlusion. No flow seen L PICA  Repeat CT head evolving L cerebellar infarct w/ stable mass effect  2D Echo EF 60-65%. No source of embolus   LDL 119  HgbA1c 5.5  VTE prophylaxis - Lovenox 40 mg sq daily  added  No antithrombotic prior to admission, now on aspirin 81 mg daily. Add plavix. Continue DAPT  X 3 MONTHS the aspirin alone   Therapy recommendations:  HH PT, HH OT  Disposition:  Pending        ROS:   14 system review of systems performed and negative with exception of ***  PMH:  Past Medical History:  Diagnosis Date  . Carpal tunnel syndrome    bilateral  . Chronic back pain    spondylolisthesis  . Constipation    takes an OTC stool softener daily as needed  . Gallstones   . History of migraine   . Hypertension   . Nocturia   . Weakness    tingling     PSH:  Past Surgical History:  Procedure Laterality Date  . BACK SURGERY     fusion  . CERVICAL SPINE SURGERY     x 4-fusion  . COLONOSCOPY    . MYRINGOTOMY    . right knee surgery     screws  . TONSILLECTOMY AND ADENOIDECTOMY    . TUBAL LIGATION      Social History:  Social History   Socioeconomic History  . Marital status: Single    Spouse name: Not on file  . Number of children: Not on file  . Years of education: Not on file  . Highest education level: Not on file  Occupational History  . Not on file  Tobacco Use  . Smoking status: Current Every Day Smoker    Packs/day: 0.50  Years: 41.00    Pack years: 20.50  . Smokeless tobacco: Never Used  Substance and Sexual Activity  . Alcohol use: No  . Drug use: No  . Sexual activity: Yes    Birth control/protection: Post-menopausal  Other Topics Concern  . Not on file  Social History Narrative  . Not on file   Social Determinants of Health   Financial Resource Strain: Not on file  Food Insecurity: Not on file  Transportation Needs: Not on file  Physical Activity: Not on file  Stress: Not on file  Social Connections: Not on file  Intimate Partner Violence: Not on file    Family History: No family history on file.  Medications:   Current Outpatient Medications on File Prior to Visit  Medication Sig Dispense Refill  . albuterol  (VENTOLIN HFA) 108 (90 Base) MCG/ACT inhaler Inhale 2 puffs into the lungs every 6 (six) hours as needed for wheezing or shortness of breath.    Marland Kitchen amLODipine (NORVASC) 10 MG tablet Take 1 tablet (10 mg total) by mouth daily. 30 tablet 3  . aspirin EC 81 MG EC tablet Take 1 tablet (81 mg total) by mouth daily. Swallow whole.  0  . atorvastatin (LIPITOR) 40 MG tablet Take 1 tablet (40 mg total) by mouth daily. 90 tablet 3  . clopidogrel (PLAVIX) 75 MG tablet Take 1 tablet (75 mg total) by mouth daily. 90 tablet 0  . losartan-hydrochlorothiazide (HYZAAR) 100-12.5 MG tablet Take 1 tablet by mouth daily. 30 tablet 3  . methocarbamol (ROBAXIN) 750 MG tablet Take 750 mg by mouth 2 (two) times daily as needed for muscle spasms.     No current facility-administered medications on file prior to visit.    Allergies:  No Known Allergies    OBJECTIVE:  Physical Exam  There were no vitals filed for this visit. There is no height or weight on file to calculate BMI. No exam data present  No flowsheet data found.   General: well developed, well nourished, seated, in no evident distress Head: head normocephalic and atraumatic.   Neck: supple with no carotid or supraclavicular bruits Cardiovascular: regular rate and rhythm, no murmurs Musculoskeletal: no deformity Skin:  no rash/petichiae Vascular:  Normal pulses all extremities   Neurologic Exam Mental Status: Awake and fully alert. Oriented to place and time. Recent and remote memory intact. Attention span, concentration and fund of knowledge appropriate. Mood and affect appropriate.  Cranial Nerves: Fundoscopic exam reveals sharp disc margins. Pupils equal, briskly reactive to light. Extraocular movements full without nystagmus. Visual fields full to confrontation. Hearing intact. Facial sensation intact. Face, tongue, palate moves normally and symmetrically.  Motor: Normal bulk and tone. Normal strength in all tested extremity muscles Sensory.:  intact to touch , pinprick , position and vibratory sensation.  Coordination: Rapid alternating movements normal in all extremities. Finger-to-nose and heel-to-shin performed accurately bilaterally. Gait and Station: Arises from chair without difficulty. Stance is normal. Gait demonstrates normal stride length and balance Reflexes: 1+ and symmetric. Toes downgoing.     NIHSS  *** Modified Rankin  ***      ASSESSMENT: Deborah Mcmahon is a 59 y.o. 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  Vascular risk factors include L VA occlusion, HTN, HLD, tobacco use, substance abuse and obesity.      PLAN:  1. B/l cerebellar strokes: Residual deficit: ***. Continue aspirin 81 mg daily and clopidogrel 75 mg daily  and atorvastatin 40 mg daily for secondary stroke prevention.  Complete 3 months DAPT then aspirin alone.  Discussed secondary stroke prevention measures and importance of close PCP follow up for aggressive stroke risk factor management  2. HTN: BP goal <130/90.  Stable on *** per PCP 3. HLD: LDL goal <70. Recent LDL 119 therefore atorvastatin 40 mg daily initiated during recent admission.  4. Substance abuse: 5. Tobacco abuse:    Follow up in *** or call earlier if needed   I spent *** minutes of face-to-face and non-face-to-face time with patient.  This included previsit chart review, lab review, study review, order entry, electronic health record documentation, patient education regarding recent stroke, residual deficits, importance of managing stroke risk factors and answered all questions to patient satisfaction     Ihor Austin, [facility]  [facility] [address] [place], [place] [address]  Phone [phone] Fax [phone] Note: This document was prepared with  digital dictation and possible smart phrase technology. Any transcriptional errors that result from this process are unintentional.

## 2020-12-28 ENCOUNTER — Emergency Department (HOSPITAL_COMMUNITY): Payer: Medicare Other

## 2020-12-28 ENCOUNTER — Other Ambulatory Visit: Payer: Self-pay

## 2020-12-28 ENCOUNTER — Inpatient Hospital Stay (HOSPITAL_COMMUNITY)
Admission: EM | Admit: 2020-12-28 | Discharge: 2021-01-01 | DRG: 378 | Discharge status: Against Medical Advice | Payer: Medicare Other | Attending: Family Medicine | Admitting: Family Medicine

## 2020-12-28 ENCOUNTER — Encounter (HOSPITAL_COMMUNITY): Payer: Self-pay | Admitting: Emergency Medicine

## 2020-12-28 DIAGNOSIS — K635 Polyp of colon: Secondary | ICD-10-CM | POA: Diagnosis present

## 2020-12-28 DIAGNOSIS — R195 Other fecal abnormalities: Secondary | ICD-10-CM | POA: Diagnosis not present

## 2020-12-28 DIAGNOSIS — K641 Second degree hemorrhoids: Secondary | ICD-10-CM | POA: Diagnosis present

## 2020-12-28 DIAGNOSIS — D62 Acute posthemorrhagic anemia: Secondary | ICD-10-CM | POA: Diagnosis present

## 2020-12-28 DIAGNOSIS — I10 Essential (primary) hypertension: Secondary | ICD-10-CM | POA: Diagnosis not present

## 2020-12-28 DIAGNOSIS — G4730 Sleep apnea, unspecified: Secondary | ICD-10-CM | POA: Diagnosis present

## 2020-12-28 DIAGNOSIS — N179 Acute kidney failure, unspecified: Secondary | ICD-10-CM | POA: Diagnosis present

## 2020-12-28 DIAGNOSIS — K254 Chronic or unspecified gastric ulcer with hemorrhage: Principal | ICD-10-CM | POA: Diagnosis present

## 2020-12-28 DIAGNOSIS — E785 Hyperlipidemia, unspecified: Secondary | ICD-10-CM | POA: Diagnosis present

## 2020-12-28 DIAGNOSIS — I129 Hypertensive chronic kidney disease with stage 1 through stage 4 chronic kidney disease, or unspecified chronic kidney disease: Secondary | ICD-10-CM | POA: Diagnosis present

## 2020-12-28 DIAGNOSIS — F17210 Nicotine dependence, cigarettes, uncomplicated: Secondary | ICD-10-CM | POA: Diagnosis present

## 2020-12-28 DIAGNOSIS — G8929 Other chronic pain: Secondary | ICD-10-CM | POA: Diagnosis present

## 2020-12-28 DIAGNOSIS — D509 Iron deficiency anemia, unspecified: Secondary | ICD-10-CM | POA: Diagnosis present

## 2020-12-28 DIAGNOSIS — Z20822 Contact with and (suspected) exposure to covid-19: Secondary | ICD-10-CM | POA: Diagnosis present

## 2020-12-28 DIAGNOSIS — I639 Cerebral infarction, unspecified: Secondary | ICD-10-CM | POA: Diagnosis present

## 2020-12-28 DIAGNOSIS — M549 Dorsalgia, unspecified: Secondary | ICD-10-CM | POA: Diagnosis present

## 2020-12-28 DIAGNOSIS — K2970 Gastritis, unspecified, without bleeding: Secondary | ICD-10-CM | POA: Diagnosis present

## 2020-12-28 DIAGNOSIS — K921 Melena: Secondary | ICD-10-CM | POA: Diagnosis not present

## 2020-12-28 DIAGNOSIS — N183 Chronic kidney disease, stage 3 unspecified: Secondary | ICD-10-CM | POA: Diagnosis present

## 2020-12-28 DIAGNOSIS — Z7982 Long term (current) use of aspirin: Secondary | ICD-10-CM

## 2020-12-28 DIAGNOSIS — K5730 Diverticulosis of large intestine without perforation or abscess without bleeding: Secondary | ICD-10-CM | POA: Diagnosis present

## 2020-12-28 DIAGNOSIS — K259 Gastric ulcer, unspecified as acute or chronic, without hemorrhage or perforation: Secondary | ICD-10-CM | POA: Diagnosis not present

## 2020-12-28 DIAGNOSIS — D649 Anemia, unspecified: Secondary | ICD-10-CM | POA: Diagnosis not present

## 2020-12-28 DIAGNOSIS — Z8673 Personal history of transient ischemic attack (TIA), and cerebral infarction without residual deficits: Secondary | ICD-10-CM

## 2020-12-28 DIAGNOSIS — N1831 Chronic kidney disease, stage 3a: Secondary | ICD-10-CM | POA: Diagnosis present

## 2020-12-28 DIAGNOSIS — K449 Diaphragmatic hernia without obstruction or gangrene: Secondary | ICD-10-CM | POA: Diagnosis present

## 2020-12-28 DIAGNOSIS — Z7902 Long term (current) use of antithrombotics/antiplatelets: Secondary | ICD-10-CM | POA: Diagnosis not present

## 2020-12-28 DIAGNOSIS — M109 Gout, unspecified: Secondary | ICD-10-CM | POA: Diagnosis present

## 2020-12-28 DIAGNOSIS — Z79899 Other long term (current) drug therapy: Secondary | ICD-10-CM | POA: Diagnosis not present

## 2020-12-28 DIAGNOSIS — Z1211 Encounter for screening for malignant neoplasm of colon: Secondary | ICD-10-CM | POA: Diagnosis not present

## 2020-12-28 DIAGNOSIS — Z5329 Procedure and treatment not carried out because of patient's decision for other reasons: Secondary | ICD-10-CM | POA: Diagnosis present

## 2020-12-28 LAB — CBC
HCT: 22.6 % — ABNORMAL LOW (ref 36.0–46.0)
Hemoglobin: 6.5 g/dL — CL (ref 12.0–15.0)
MCH: 25 pg — ABNORMAL LOW (ref 26.0–34.0)
MCHC: 28.8 g/dL — ABNORMAL LOW (ref 30.0–36.0)
MCV: 86.9 fL (ref 80.0–100.0)
Platelets: 307 10*3/uL (ref 150–400)
RBC: 2.6 MIL/uL — ABNORMAL LOW (ref 3.87–5.11)
RDW: 16.4 % — ABNORMAL HIGH (ref 11.5–15.5)
WBC: 6.5 10*3/uL (ref 4.0–10.5)
nRBC: 0 % (ref 0.0–0.2)

## 2020-12-28 LAB — SARS CORONAVIRUS 2 BY RT PCR (HOSPITAL ORDER, PERFORMED IN ~~LOC~~ HOSPITAL LAB): SARS Coronavirus 2: NEGATIVE

## 2020-12-28 LAB — COMPREHENSIVE METABOLIC PANEL
ALT: 17 U/L (ref 0–44)
AST: 19 U/L (ref 15–41)
Albumin: 3.1 g/dL — ABNORMAL LOW (ref 3.5–5.0)
Alkaline Phosphatase: 55 U/L (ref 38–126)
Anion gap: 9 (ref 5–15)
BUN: 26 mg/dL — ABNORMAL HIGH (ref 6–20)
CO2: 22 mmol/L (ref 22–32)
Calcium: 8.8 mg/dL — ABNORMAL LOW (ref 8.9–10.3)
Chloride: 108 mmol/L (ref 98–111)
Creatinine, Ser: 1.64 mg/dL — ABNORMAL HIGH (ref 0.44–1.00)
GFR, Estimated: 36 mL/min — ABNORMAL LOW (ref >60)
Glucose, Bld: 95 mg/dL (ref 70–99)
Potassium: 4.9 mmol/L (ref 3.5–5.1)
Sodium: 139 mmol/L (ref 135–145)
Total Bilirubin: 0.2 mg/dL — ABNORMAL LOW (ref 0.3–1.2)
Total Protein: 6.5 g/dL (ref 6.5–8.1)

## 2020-12-28 LAB — PREPARE RBC (CROSSMATCH)

## 2020-12-28 LAB — PROTIME-INR
INR: 1.1 (ref 0.8–1.2)
Prothrombin Time: 14 seconds (ref 11.4–15.2)

## 2020-12-28 LAB — POC OCCULT BLOOD, ED: Fecal Occult Bld: NEGATIVE

## 2020-12-28 MED ORDER — ATORVASTATIN CALCIUM 40 MG PO TABS
40.0000 mg | ORAL_TABLET | Freq: Every day | ORAL | Status: DC
  Administered 2020-12-29 – 2021-01-01 (×4): 40 mg via ORAL
  Filled 2020-12-28 (×3): qty 1
  Filled 2020-12-28: qty 4
  Filled 2020-12-28: qty 1

## 2020-12-28 MED ORDER — SODIUM CHLORIDE 0.9 % IV SOLN
10.0000 mL/h | Freq: Once | INTRAVENOUS | Status: AC
  Administered 2020-12-28: 10 mL/h via INTRAVENOUS

## 2020-12-28 MED ORDER — ONDANSETRON HCL 4 MG/2ML IJ SOLN: 4.0000 mg | Freq: Four times a day (QID) | INTRAMUSCULAR | Status: DC | PRN

## 2020-12-28 MED ORDER — PANTOPRAZOLE SODIUM 40 MG PO TBEC
40.0000 mg | DELAYED_RELEASE_TABLET | Freq: Two times a day (BID) | ORAL | Status: DC
  Administered 2020-12-28 – 2021-01-01 (×7): 40 mg via ORAL
  Filled 2020-12-28 (×8): qty 1

## 2020-12-28 MED ORDER — ACETAMINOPHEN 650 MG RE SUPP: 650.0000 mg | Freq: Four times a day (QID) | RECTAL | Status: DC | PRN

## 2020-12-28 MED ORDER — AMLODIPINE BESYLATE 10 MG PO TABS
10.0000 mg | ORAL_TABLET | Freq: Every day | ORAL | Status: DC
  Administered 2020-12-29 – 2021-01-01 (×4): 10 mg via ORAL
  Filled 2020-12-28 (×2): qty 1
  Filled 2020-12-28: qty 2
  Filled 2020-12-28 (×2): qty 1

## 2020-12-28 MED ORDER — ONDANSETRON HCL 4 MG PO TABS: 4.0000 mg | ORAL_TABLET | Freq: Four times a day (QID) | ORAL | Status: DC | PRN

## 2020-12-28 MED ORDER — ACETAMINOPHEN 325 MG PO TABS: 650.0000 mg | ORAL_TABLET | Freq: Four times a day (QID) | ORAL | Status: DC | PRN

## 2020-12-28 NOTE — ED Provider Notes (Signed)
[facility] EMERGENCY DEPARTMENT Provider Note   CSN: [id number] Arrival date & time: 12/28/20  1840     History Chief Complaint  Patient presents with  . Abnormal Lab    Deborah Mcmahon is a 60 y.o. female.  Patient with history of recent stroke and admission in December/2021 and started on Plavix, history of chronic kidney disease, hypertension, substance abuse (cocaine), last alcohol last night but states not chronic use --presents to the emergency department for low hemoglobin.  Patient reports increased fatigue and shortness of breath with exertion over the past several weeks.  She states that she had blood work yesterday showing anemia.:  Records demonstrate hemoglobin 13.7 (11/17/20) >> 6.5 today.  Patient reports having some black stools over the past 2 weeks however stool was Vo today.  She denies other sources of bleeding or easy bruising.  She has never had a colonoscopy.  She has never had a problem with GI bleeding.  She states that up until several weeks ago, she was taking a lot of Advil but has not taken any in 2 weeks.  She denies fevers, URI symptoms, cough.  No chest pain or syncope.  No abdominal pain. The onset of this condition was acute. The course is constant. Aggravating factors: exertion. Alleviating factors: none.          Past Medical History:  Diagnosis Date  . Carpal tunnel syndrome    bilateral  . Chronic back pain    spondylolisthesis  . Constipation    takes an OTC stool softener daily as needed  . Gallstones   . History of migraine   . Hypertension   . Nocturia   . Weakness    tingling     Patient Active Problem List   Diagnosis Date Noted  . Substance use 11/16/2020  . CVA (cerebral vascular accident) (HCC) 11/15/2020  . Tobacco abuse 11/15/2020  . CKD (chronic kidney disease), stage III (HCC) 11/15/2020  . Essential hypertension 12/10/2018    Past Surgical History:  Procedure Laterality Date  . BACK SURGERY      fusion  . CERVICAL SPINE SURGERY     x 4-fusion  . COLONOSCOPY    . MYRINGOTOMY    . right knee surgery     screws  . TONSILLECTOMY AND ADENOIDECTOMY    . TUBAL LIGATION       OB History   No obstetric history on file.     No family history on file.  Social History   Tobacco Use  . Smoking status: Current Every Day Smoker    Packs/day: 0.50    Years: 41.00    Pack years: 20.50  . Smokeless tobacco: Never Used  Substance Use Topics  . Alcohol use: No  . Drug use: No    Home Medications Prior to Admission medications   Medication Sig Start Date End Date Taking? Authorizing Provider  albuterol (VENTOLIN HFA) 108 (90 Base) MCG/ACT inhaler Inhale 2 puffs into the lungs every 6 (six) hours as needed for wheezing or shortness of breath.    [provider]  amLODipine (NORVASC) 10 MG tablet Take 1 tablet (10 mg total) by mouth daily. 11/17/20   Lewie Chamber, MD  aspirin EC 81 MG EC tablet Take 1 tablet (81 mg total) by mouth daily. Swallow whole. 11/17/20   Lewie Chamber, MD  atorvastatin (LIPITOR) 40 MG tablet Take 1 tablet (40 mg total) by mouth daily. 11/17/20   Lewie Chamber, MD  clopidogrel (  PLAVIX) 75 MG tablet Take 1 tablet (75 mg total) by mouth daily. 11/17/20 02/15/21  Lewie Chamber, MD  losartan-hydrochlorothiazide (HYZAAR) 100-12.5 MG tablet Take 1 tablet by mouth daily. 11/17/20   Lewie Chamber, MD  methocarbamol (ROBAXIN) 750 MG tablet Take 750 mg by mouth 2 (two) times daily as needed for muscle spasms.    [provider]    Allergies    Patient has no known allergies.  Review of Systems   Review of Systems  Constitutional: Negative for fever.  HENT: Negative for rhinorrhea and sore throat.   Eyes: Negative for redness.  Respiratory: Positive for shortness of breath. Negative for cough.   Cardiovascular: Negative for chest pain.  Gastrointestinal: Negative for abdominal pain, diarrhea, nausea and vomiting.  Genitourinary: Negative  for dysuria, frequency, hematuria, urgency and vaginal bleeding.  Musculoskeletal: Negative for myalgias.  Skin: Negative for rash.  Neurological: Negative for light-headedness and headaches.    Physical Exam Updated Vital Signs BP (!) 148/79 (BP Location: Right Arm)   Pulse 90   Temp 99.3 F (37.4 C)   Resp (!) 24   SpO2 99%   Physical Exam Vitals and nursing note reviewed. Exam conducted with a chaperone present.  Constitutional:      General: She is not in acute distress.    Appearance: She is well-developed.  HENT:     Head: Normocephalic and atraumatic.     Right Ear: External ear normal.     Left Ear: External ear normal.     Nose: Nose normal.  Eyes:     Conjunctiva/sclera: Conjunctivae normal.  Cardiovascular:     Rate and Rhythm: Normal rate and regular rhythm.     Heart sounds: No murmur heard.   Pulmonary:     Effort: No respiratory distress.     Breath sounds: No wheezing, rhonchi or rales.  Abdominal:     Palpations: Abdomen is soft.     Tenderness: There is no abdominal tenderness. There is no guarding or rebound.  Genitourinary:    Rectum: No tenderness or external hemorrhoid.  Musculoskeletal:     Cervical back: Normal range of motion and neck supple.     Right lower leg: No edema.     Left lower leg: No edema.  Skin:    General: Skin is warm and dry.     Findings: No rash.  Neurological:     General: No focal deficit present.     Mental Status: She is alert. Mental status is at baseline.     Motor: No weakness.  Psychiatric:        Mood and Affect: Mood normal.     ED Results / Procedures / Treatments   Labs (all labs ordered are listed, but only abnormal results are displayed) Labs Reviewed  COMPREHENSIVE METABOLIC PANEL - Abnormal; Notable for the following components:      Result Value   BUN 26 (*)    Creatinine, Ser 1.64 (*)    Calcium 8.8 (*)    Albumin 3.1 (*)    Total Bilirubin 0.2 (*)    GFR, Estimated 36 (*)    All other  components within normal limits  CBC - Abnormal; Notable for the following components:   RBC 2.60 (*)    Hemoglobin 6.5 (*)    HCT 22.6 (*)    MCH 25.0 (*)    MCHC 28.8 (*)    RDW 16.4 (*)    All other components within normal limits  SARS  CORONAVIRUS 2 BY RT PCR (HOSPITAL ORDER, PERFORMED IN [facility])  PROTIME-INR  RAPID URINE DRUG SCREEN, HOSP PERFORMED  POC OCCULT BLOOD, ED  TYPE AND SCREEN  PREPARE RBC (CROSSMATCH)    EKG None  Radiology DG Chest 2 View  Result Date: 12/28/2020 CLINICAL DATA:  Shortness of breath.  Low hemoglobin. EXAM: CHEST - 2 VIEW COMPARISON:  04/23/2012 FINDINGS: The heart is normal in size. Normal unchanged mediastinal contours. There is mild diffuse bronchial and interstitial thickening. No focal airspace disease. No significant pleural effusion. No pneumothorax. Surgical hardware in the lower cervical spine is partially included. IMPRESSION: Mild diffuse bronchial and interstitial thickening, may be pulmonary edema or infection. Electronically Signed   By: Narda Rutherford M.D.   On: 12/28/2020 19:29    Procedures Procedures   Medications Ordered in ED Medications  0.9 %  sodium chloride infusion (has no administration in time range)    ED Course  I have reviewed the triage vital signs and the nursing notes.  Pertinent labs & imaging results that were available during my care of the patient were reviewed by me and considered in my medical decision making (see chart for details).  Patient seen and examined. Work-up initiated. DRE with NT chaperone. No gross melena.   Vital signs reviewed and are as follows: BP (!) 148/79 (BP Location: Right Arm)   Pulse 90   Temp 99.3 F (37.4 C)   Resp (!) 24   SpO2 99%   9:07 PM Discussed with Dr. Julian Reil who will see. Pt is stable.   CRITICAL CARE Performed by: Renne Crigler PA-C Total critical care time: 35 minutes Critical care time was exclusive of separately billable procedures  and treating other patients. Critical care was necessary to treat or prevent imminent or life-threatening deterioration. Critical care was time spent personally by me on the following activities: development of treatment plan with patient and/or surrogate as well as nursing, discussions with consultants, evaluation of patient's response to treatment, examination of patient, obtaining history from patient or surrogate, ordering and performing treatments and interventions, ordering and review of laboratory studies, ordering and review of radiographic studies, pulse oximetry and re-evaluation of patient's condition.     MDM Rules/Calculators/A&P                          Admit.    Final Clinical Impression(s) / ED Diagnoses Final diagnoses:  Symptomatic anemia    Rx / DC Orders ED Discharge Orders    None       Desmond Dike 12/28/20 2108    Benjiman Core, MD 12/28/20 [phone]

## 2020-12-28 NOTE — ED Triage Notes (Signed)
Pt arrives to ED with chief complaint of abnormal lab of low hemoglobin of 8. Patient reports a recent hx of stroke and started on plavix she then began to have black stools and shortness of breath. Pt had blood work done yesterday and showed a drop in hgb from 14 to 8.

## 2020-12-28 NOTE — H&P (Addendum)
History and Physical    [name]:[id number] DOB: August 11, 1961 DOA: 12/28/2020  PCP: Loura Back, NP  Patient coming from: Home  I have personally briefly reviewed patient's old medical records in [facility]  Chief Complaint: Anemia  HPI: Deborah Mcmahon is a 60 y.o. female with medical history significant of HTN, cocaine abuse, CKD 3, Stroke in Dec 21.  Pt started on ASA+Plavix following stroke in Dec.  Onset of increased fatiuge, SOB, DOE, over past couple of weeks.  Worsening, persistent.  Had blood work yesterday at PCP showing anemia with HGB of 8.  Has had some black stools over past 2 weeks but stool was [place] today.  Never had colonoscopy, never had GIB previously.  Up until several weeks ago was taking a lot of Advil but none in several weeks.  No abd pain, no CP, no cough, no syncope.   ED Course: HGB 6.5.  1u PRBC transfusion ordered.  Creat 1.6 up from 1.3 in Dec.   Review of Systems: As per HPI, otherwise all review of systems negative.  Past Medical History:  Diagnosis Date  . Carpal tunnel syndrome    bilateral  . Chronic back pain    spondylolisthesis  . Constipation    takes an OTC stool softener daily as needed  . Gallstones   . History of migraine   . Hypertension   . Nocturia   . Weakness    tingling     Past Surgical History:  Procedure Laterality Date  . BACK SURGERY     fusion  . CERVICAL SPINE SURGERY     x 4-fusion  . COLONOSCOPY    . MYRINGOTOMY    . right knee surgery     screws  . TONSILLECTOMY AND ADENOIDECTOMY    . TUBAL LIGATION       reports that she has been smoking. She has a 20.50 pack-year smoking history. She has never used smokeless tobacco. She reports that she does not drink alcohol and does not use drugs.  No Known Allergies  Family History  Problem Relation Age of Onset  . Anesthesia problems Neg Hx      Prior to Admission medications   Medication Sig Start Date End Date Taking? Authorizing  Provider  albuterol (VENTOLIN HFA) 108 (90 Base) MCG/ACT inhaler Inhale 2 puffs into the lungs every 6 (six) hours as needed for wheezing or shortness of breath.    [provider]  amLODipine (NORVASC) 10 MG tablet Take 1 tablet (10 mg total) by mouth daily. 11/17/20   Lewie Chamber, MD  aspirin EC 81 MG EC tablet Take 1 tablet (81 mg total) by mouth daily. Swallow whole. 11/17/20   Lewie Chamber, MD  atorvastatin (LIPITOR) 40 MG tablet Take 1 tablet (40 mg total) by mouth daily. 11/17/20   Lewie Chamber, MD  clopidogrel (PLAVIX) 75 MG tablet Take 1 tablet (75 mg total) by mouth daily. 11/17/20 02/15/21  Lewie Chamber, MD  losartan-hydrochlorothiazide (HYZAAR) 100-12.5 MG tablet Take 1 tablet by mouth daily. 11/17/20   Lewie Chamber, MD  methocarbamol (ROBAXIN) 750 MG tablet Take 750 mg by mouth 2 (two) times daily as needed for muscle spasms.    [provider]    Physical Exam: Vitals:   12/28/20 1847  BP: (!) 148/79  Pulse: 90  Resp: (!) 24  Temp: 99.3 F (37.4 C)  SpO2: 99%    Constitutional: NAD, calm, comfortable Eyes: PERRL, lids and conjunctivae normal ENMT: Mucous membranes  are moist. Posterior pharynx clear of any exudate or lesions.Normal dentition.  Neck: normal, supple, no masses, no thyromegaly Respiratory: clear to auscultation bilaterally, no wheezing, no crackles. Normal respiratory effort. No accessory muscle use.  Cardiovascular: Regular rate and rhythm, no murmurs / rubs / gallops. No extremity edema. 2+ pedal pulses. No carotid bruits.  Abdomen: no tenderness, no masses palpated. No hepatosplenomegaly. Bowel sounds positive.  Musculoskeletal: no clubbing / cyanosis. No joint deformity upper and lower extremities. Good ROM, no contractures. Normal muscle tone.  Skin: no rashes, lesions, ulcers. No induration Neurologic: CN 2-12 grossly intact. Sensation intact, DTR normal. Strength 5/5 in all 4.  Psychiatric: Normal judgment and insight. Alert  and oriented x 3. Normal mood.    Labs on Admission: I have personally reviewed following labs and imaging studies  CBC: Recent Labs  Lab 12/28/20 1903  WBC 6.5  HGB 6.5*  HCT 22.6*  MCV 86.9  PLT 307   Basic Metabolic Panel: Recent Labs  Lab 12/28/20 1903  NA 139  K 4.9  CL 108  CO2 22  GLUCOSE 95  BUN 26*  CREATININE 1.64*  CALCIUM 8.8*   GFR: CrCl cannot be calculated (Unknown ideal weight.). Liver Function Tests: Recent Labs  Lab 12/28/20 1903  AST 19  ALT 17  ALKPHOS 55  BILITOT 0.2*  PROT 6.5  ALBUMIN 3.1*   No results for input(s): LIPASE, AMYLASE in the last 168 hours. No results for input(s): AMMONIA in the last 168 hours. Coagulation Profile: Recent Labs  Lab 12/28/20 2105  INR 1.1   Cardiac Enzymes: No results for input(s): CKTOTAL, CKMB, CKMBINDEX, TROPONINI in the last 168 hours. BNP (last 3 results) No results for input(s): PROBNP in the last 8760 hours. HbA1C: No results for input(s): HGBA1C in the last 72 hours. CBG: No results for input(s): GLUCAP in the last 168 hours. Lipid Profile: No results for input(s): CHOL, HDL, LDLCALC, TRIG, CHOLHDL, LDLDIRECT in the last 72 hours. Thyroid Function Tests: No results for input(s): TSH, T4TOTAL, FREET4, T3FREE, THYROIDAB in the last 72 hours. Anemia Panel: No results for input(s): VITAMINB12, FOLATE, FERRITIN, TIBC, IRON, RETICCTPCT in the last 72 hours. Urine analysis:    Component Value Date/Time   COLORURINE YELLOW 11/06/2009 1109   APPEARANCEUR CLEAR 11/06/2009 1109   LABSPEC 1.025 11/13/2020 1348   PHURINE 5.5 11/13/2020 1348   GLUCOSEU NEGATIVE 11/13/2020 1348   HGBUR TRACE (A) 11/13/2020 1348   BILIRUBINUR NEGATIVE 11/13/2020 1348   KETONESUR NEGATIVE 11/13/2020 1348   PROTEINUR NEGATIVE 11/13/2020 1348   UROBILINOGEN 0.2 11/13/2020 1348   NITRITE NEGATIVE 11/13/2020 1348   LEUKOCYTESUR NEGATIVE 11/13/2020 1348    Radiological Exams on Admission: DG Chest 2 View  Result  Date: 12/28/2020 CLINICAL DATA:  Shortness of breath.  Low hemoglobin. EXAM: CHEST - 2 VIEW COMPARISON:  04/23/2012 FINDINGS: The heart is normal in size. Normal unchanged mediastinal contours. There is mild diffuse bronchial and interstitial thickening. No focal airspace disease. No significant pleural effusion. No pneumothorax. Surgical hardware in the lower cervical spine is partially included. IMPRESSION: Mild diffuse bronchial and interstitial thickening, may be pulmonary edema or infection. Electronically Signed   By: Narda Rutherford M.D.   On: 12/28/2020 19:29    EKG: Independently reviewed.  Assessment/Plan Principal Problem:   Acute blood loss anemia Active Problems:   Essential hypertension   CVA (cerebral vascular accident) (HCC)   CKD (chronic kidney disease), stage III (HCC)   Melena    1. Acute blood loss  anemia - 1. Recent melena, though hemoccult neg today 2. Suspect GIB due to ASA+Plavix, possibly NSAID ulcer given the extensive h/o Advil use 3. NPO 4. Transfuse 1u PRBC 5. Start protonix 40mg  PO BID 6. Avoid NSAIDs 7. Hold ASA+Plavix 8. GI consult in AM 2. HTN - 1. Cont Amlodipine 2. Holding HCTZ-Lisinopril 3. CKD 3 - 1. Slight elevation in creat from baseline 2. Will hold HCTZ-Lisinopril 3. Repeat BMP in AM after transfusion 4. H/o Stroke - 1. Holding ASA+Plavix in setting of GIB  DVT prophylaxis: SCDs Code Status: Full Family Communication: No family in room Disposition Plan: Home after HGB stable and GI workup Consults called: Message sent to Dr. Admission status: Admit to inpatient  Severity of Illness: The appropriate patient status for this patient is INPATIENT. Inpatient status is judged to be reasonable and necessary in order to provide the required intensity of service to ensure the patient's safety. The patient's presenting symptoms, physical exam findings, and initial radiographic and laboratory data in the context of their chronic  comorbidities is felt to place them at high risk for further clinical deterioration. Furthermore, it is not anticipated that the patient will be medically stable for discharge from the hospital within 2 midnights of admission. The following factors support the patient status of inpatient.   IP status for GIB with anemia requiring transfusion.   * I certify that at the point of admission it is my clinical judgment that the patient will require inpatient hospital care spanning beyond 2 midnights from the point of admission due to high intensity of service, high risk for further deterioration and high frequency of surveillance required.*    Larrisa Cravey M. DO [facility]  How to contact the [facility] Attending or Consulting provider 7A - 7P or covering provider during after hours 7P -7A, for this patient?  1. Check the care team in [facility] and look for a) attending/consulting [facility] provider listed and b) the [facility] team listed 2. Log into [email or web address]  Amion Physician Scheduling and messaging for groups and whole hospitals  On call and physician scheduling software for group practices, residents, hospitalists and other medical providers for call, clinic, rotation and shift schedules. OnCall Enterprise is a hospital-wide system for scheduling doctors and paging doctors on call. EasyPlot is for scientific plotting and data analysis.  [email or web address]  and use [place]'s universal password to access. If you do not have the password, please contact the hospital operator.  3. Locate the [facility] provider you are looking for under [facility] and page to a number that you can be directly reached. 4. If you still have difficulty reaching the provider, please page the [facility] (Director on Call) for the Hospitalists listed on amion for assistance.  12/28/2020, 9:38 PM

## 2020-12-29 ENCOUNTER — Encounter (HOSPITAL_COMMUNITY): Payer: Self-pay | Admitting: Internal Medicine

## 2020-12-29 ENCOUNTER — Other Ambulatory Visit: Payer: Self-pay

## 2020-12-29 DIAGNOSIS — D62 Acute posthemorrhagic anemia: Secondary | ICD-10-CM | POA: Diagnosis not present

## 2020-12-29 DIAGNOSIS — N1831 Chronic kidney disease, stage 3a: Secondary | ICD-10-CM | POA: Diagnosis not present

## 2020-12-29 DIAGNOSIS — I10 Essential (primary) hypertension: Secondary | ICD-10-CM | POA: Diagnosis not present

## 2020-12-29 DIAGNOSIS — Z1211 Encounter for screening for malignant neoplasm of colon: Secondary | ICD-10-CM

## 2020-12-29 DIAGNOSIS — K921 Melena: Secondary | ICD-10-CM | POA: Diagnosis not present

## 2020-12-29 DIAGNOSIS — R195 Other fecal abnormalities: Secondary | ICD-10-CM

## 2020-12-29 LAB — TYPE AND SCREEN
ABO/RH(D): A POS
Antibody Screen: NEGATIVE
Unit division: 0

## 2020-12-29 LAB — CBC
HCT: 27.1 % — ABNORMAL LOW (ref 36.0–46.0)
Hemoglobin: 8.2 g/dL — ABNORMAL LOW (ref 12.0–15.0)
MCH: 26.5 pg (ref 26.0–34.0)
MCHC: 30.3 g/dL (ref 30.0–36.0)
MCV: 87.4 fL (ref 80.0–100.0)
Platelets: 334 10*3/uL (ref 150–400)
RBC: 3.1 MIL/uL — ABNORMAL LOW (ref 3.87–5.11)
RDW: 16.3 % — ABNORMAL HIGH (ref 11.5–15.5)
WBC: 7.1 10*3/uL (ref 4.0–10.5)
nRBC: 0 % (ref 0.0–0.2)

## 2020-12-29 LAB — BASIC METABOLIC PANEL
Anion gap: 14 (ref 5–15)
BUN: 21 mg/dL — ABNORMAL HIGH (ref 6–20)
CO2: 19 mmol/L — ABNORMAL LOW (ref 22–32)
Calcium: 8.8 mg/dL — ABNORMAL LOW (ref 8.9–10.3)
Chloride: 107 mmol/L (ref 98–111)
Creatinine, Ser: 1.37 mg/dL — ABNORMAL HIGH (ref 0.44–1.00)
GFR, Estimated: 44 mL/min — ABNORMAL LOW (ref >60)
Glucose, Bld: 112 mg/dL — ABNORMAL HIGH (ref 70–99)
Potassium: 4.9 mmol/L (ref 3.5–5.1)
Sodium: 140 mmol/L (ref 135–145)

## 2020-12-29 LAB — I-STAT ARTERIAL BLOOD GAS, ED
Acid-base deficit: 2 mmol/L (ref 0.0–2.0)
Bicarbonate: 21.9 mmol/L (ref 20.0–28.0)
Calcium, Ion: 1.17 mmol/L (ref 1.15–1.40)
HCT: 26 % — ABNORMAL LOW (ref 36.0–46.0)
Hemoglobin: 8.8 g/dL — ABNORMAL LOW (ref 12.0–15.0)
O2 Saturation: 98 %
Patient temperature: 99.1
Potassium: 4.5 mmol/L (ref 3.5–5.1)
Sodium: 143 mmol/L (ref 135–145)
TCO2: 23 mmol/L (ref 22–32)
pCO2 arterial: 31.6 mmHg — ABNORMAL LOW (ref 32.0–48.0)
pH, Arterial: 7.45 (ref 7.350–7.450)
pO2, Arterial: 92 mmHg (ref 83.0–108.0)

## 2020-12-29 LAB — HIV ANTIBODY (ROUTINE TESTING W REFLEX): HIV Screen 4th Generation wRfx: NONREACTIVE

## 2020-12-29 LAB — RAPID URINE DRUG SCREEN, HOSP PERFORMED
Amphetamines: NOT DETECTED
Barbiturates: NOT DETECTED
Benzodiazepines: NOT DETECTED
Cocaine: POSITIVE — AB
Opiates: NOT DETECTED
Tetrahydrocannabinol: NOT DETECTED

## 2020-12-29 LAB — BPAM RBC
Blood Product Expiration Date: 202203012359
ISSUE DATE / TIME: 202202032233
Unit Type and Rh: 6200

## 2020-12-29 MED ORDER — LOSARTAN POTASSIUM-HCTZ 100-12.5 MG PO TABS: 1.0000 | ORAL_TABLET | Freq: Every day | ORAL | Status: DC

## 2020-12-29 MED ORDER — HYDROCHLOROTHIAZIDE 12.5 MG PO CAPS
12.5000 mg | ORAL_CAPSULE | Freq: Every day | ORAL | Status: DC
  Administered 2020-12-29 – 2021-01-01 (×4): 12.5 mg via ORAL
  Filled 2020-12-29 (×5): qty 1

## 2020-12-29 MED ORDER — BUPROPION HCL ER (XL) 150 MG PO TB24
150.0000 mg | ORAL_TABLET | Freq: Every morning | ORAL | Status: DC
  Administered 2020-12-29 – 2021-01-01 (×4): 150 mg via ORAL
  Filled 2020-12-29 (×5): qty 1

## 2020-12-29 MED ORDER — BISACODYL 5 MG PO TBEC
20.0000 mg | DELAYED_RELEASE_TABLET | Freq: Once | ORAL | Status: AC
  Administered 2020-12-29: 20 mg via ORAL
  Filled 2020-12-29: qty 4

## 2020-12-29 MED ORDER — ALLOPURINOL 100 MG PO TABS
100.0000 mg | ORAL_TABLET | Freq: Every day | ORAL | Status: DC
  Administered 2020-12-29 – 2021-01-01 (×4): 100 mg via ORAL
  Filled 2020-12-29 (×5): qty 1

## 2020-12-29 MED ORDER — LOSARTAN POTASSIUM 50 MG PO TABS
100.0000 mg | ORAL_TABLET | Freq: Every day | ORAL | Status: DC
  Administered 2020-12-29 – 2021-01-01 (×4): 100 mg via ORAL
  Filled 2020-12-29 (×5): qty 2

## 2020-12-29 NOTE — Progress Notes (Signed)
PROGRESS NOTE    Deborah Mcmahon DOB: [birth year] DOA: 12/28/2020 PCP: Loura Back, NP   Brief Narrative: Deborah Mcmahon is a 60 y.o. female with a history of hypertension, cocaine abuse, CKD stage IIIa, CVA. Patient presented secondary to hemoglobin of 8 at her PCPs office in setting of worsening fatigue and dyspnea on exertion. She was found to have acute blood loss anemia secondary to presumed upper GI bleed.   Assessment & Plan:   Principal Problem:   Acute blood loss anemia Active Problems:   Essential hypertension   CVA (cerebral vascular accident) (HCC)   CKD (chronic kidney disease), stage III (HCC)   Melena   Melena Hemoccult negative. Associated anemia. Patient is on aspirin and Plavix and takes Mobic as an outpatient. GI consulted on admission. -GI recommendations: pending -Continue Protonix  Acute blood loss anemia Baseline hemoglobin of 13-14. Secondary to presume GI bleeding (presumed upper). Patient presented with a hemoglobin of 6.5. She has received 1 unit of PRBC to date. Hemoglobin of 8.2. -Daily CBC. -Watch for recurrent bleeding  AKI on CKD stage IIIa Baseline creatinine of about 1.1-1.2. Creatinine of 1.64 on admission and has improved.  Primary hypertension Patient is on amlodipine and Hyzaar as an outpatient.  -Continue amlodipine and resume home Hyzaar  Lethargy Severe. Per nursing, daughter states that patient is a hard sleeper. Could not interview patient effectively this morning. On room air with no concern for respiratory compromise. Likely has sleep apnea. -ABG  Hyperlipidemia -Continue Lipitor  History of stroke Unknown deficits if any. On aspirin and Plavix -Aspirin and Plavix held secondary to anemia and melena from presumed GI bleed  Gout -Continue allopurinol  Substance use Patient is cocaine positive   DVT prophylaxis: SCDs Code Status:   Code Status: Full Code Family Communication: None at bedside.  Significant other on record is no longer affiliated with the patient. Daughter's contact is not available. Disposition Plan: Discharge likely in several days pending stable hemoglobin in addition to GI recommendations   Consultants:   [facility]  Procedures:   None  Antimicrobials:  None    Subjective: Patient unable to provide history secondary to mental status.  Objective: Vitals:   12/29/20 0345 12/29/20 0400 12/29/20 0500 12/29/20 0737  BP: (!) 190/95 (!) 190/88 (!) 171/93 (!) 158/89  Pulse: 84 86 87 81  Resp: (!) 21 18 14 18   Temp:  99.1 F (37.3 C)    TempSrc:  Oral    SpO2: 96% 93% 97% 100%    Intake/Output Summary (Last 24 hours) at 12/29/2020 02/26/2021 Last data filed at 12/28/2020 2325 Gross per 24 hour  Intake 315 ml  Output -  Net 315 ml   There were no vitals filed for this visit.  Examination:  General exam: Appears calm and comfortable Respiratory system: Clear to auscultation. Respiratory effort normal. Cardiovascular system: S1 & S2 heard, RRR. No murmurs, rubs, gallops or clicks. Gastrointestinal system: Abdomen is nondistended, soft and nontender. No organomegaly or masses felt. Normal bowel sounds heard. Central nervous system: Lethargic. Awakens to tactile stimuli and responds verbally but falls back asleep. No obvious focal neurological deficits. Musculoskeletal: No edema. No calf tenderness Skin: No cyanosis. No rashes Psychiatry: Judgement and insight appear impaired    Data Reviewed: I have personally reviewed following labs and imaging studies  CBC Lab Results  Component Value Date   WBC 7.1 12/29/2020   RBC 3.10 (L) 12/29/2020   HGB 8.2 (L) 12/29/2020  HCT 27.1 (L) 12/29/2020   MCV 87.4 12/29/2020   MCH 26.5 12/29/2020   PLT 334 12/29/2020   MCHC 30.3 12/29/2020   RDW 16.3 (H) 12/29/2020   LYMPHSABS 3.0 11/17/2020   MONOABS 0.9 11/17/2020   EOSABS 0.1 11/17/2020   BASOSABS 0.0 11/17/2020     Last metabolic panel Lab  Results  Component Value Date   NA 140 12/29/2020   K 4.9 12/29/2020   CL 107 12/29/2020   CO2 19 (L) 12/29/2020   BUN 21 (H) 12/29/2020   CREATININE 1.37 (H) 12/29/2020   GLUCOSE 112 (H) 12/29/2020   GFRNONAA 44 (L) 12/29/2020   GFRAA 64 (L) 02/17/2015   CALCIUM 8.8 (L) 12/29/2020   PHOS 4.4 11/16/2020   PROT 6.5 12/28/2020   ALBUMIN 3.1 (L) 12/28/2020   BILITOT 0.2 (L) 12/28/2020   ALKPHOS 55 12/28/2020   AST 19 12/28/2020   ALT 17 12/28/2020   ANIONGAP 14 12/29/2020    CBG (last 3)  No results for input(s): GLUCAP in the last 72 hours.   GFR: CrCl cannot be calculated (Unknown ideal weight.).  Coagulation Profile: Recent Labs  Lab 12/28/20 2105  INR 1.1    Recent Results (from the past 240 hour(s))  SARS Coronavirus 2 by RT PCR (hospital order, performed in [facility] hospital lab) Nasopharyngeal Nasopharyngeal Swab     Status: None   Collection Time: 12/28/20  8:59 PM   Specimen: Nasopharyngeal Swab  Result Value Ref Range Status   SARS Coronavirus 2 NEGATIVE NEGATIVE Final    Comment: (NOTE) SARS-CoV-2 target nucleic acids are NOT DETECTED.  The SARS-CoV-2 RNA is generally detectable in upper and lower respiratory specimens during the acute phase of infection. The lowest concentration of SARS-CoV-2 viral copies this assay can detect is 250 copies / mL. A negative result does not preclude SARS-CoV-2 infection and should not be used as the sole basis for treatment or other patient management decisions.  A negative result may occur with improper specimen collection / handling, submission of specimen other than nasopharyngeal swab, presence of viral mutation(s) within the areas targeted by this assay, and inadequate number of viral copies (<250 copies / mL). A negative result must be combined with clinical observations, patient history, and epidemiological information.  Fact Sheet for Patients:   BoilerBrush.com.cy  Fact Sheet for  Healthcare Providers: https://pope.com/  This test is not yet approved or  cleared by the [place] FDA and has been authorized for detection and/or diagnosis of SARS-CoV-2 by FDA under an Emergency Use Authorization (EUA).  This EUA will remain in effect (meaning this test can be used) for the duration of the COVID-19 declaration under Section 564(b)(1) of the Act, 21 U.S.C. section 360bbb-3(b)(1), unless the authorization is terminated or revoked sooner.  Performed at [facility], [address], [place], [place] [address]         Radiology Studies: DG Chest 2 View  Result Date: 12/28/2020 CLINICAL DATA:  Shortness of breath.  Low hemoglobin. EXAM: CHEST - 2 VIEW COMPARISON:  04/23/2012 FINDINGS: The heart is normal in size. Normal unchanged mediastinal contours. There is mild diffuse bronchial and interstitial thickening. No focal airspace disease. No significant pleural effusion. No pneumothorax. Surgical hardware in the lower cervical spine is partially included. IMPRESSION: Mild diffuse bronchial and interstitial thickening, may be pulmonary edema or infection. Electronically Signed   By: Narda Rutherford M.D.   On: 12/28/2020 19:29        Scheduled Meds: .  amLODipine  10 mg Oral Daily  . atorvastatin  40 mg Oral Daily  . pantoprazole  40 mg Oral BID   Continuous Infusions:   LOS: 1 day     Jacquelin Hawking, MD [facility] 12/29/2020, 7:52 AM  If 7PM-7AM, please contact night-coverage [email or web address]

## 2020-12-29 NOTE — ED Notes (Addendum)
Pt's Goddaughter in to visit. Pt was noted to be eating french fries that her family member brought in. Pt advised of her NPO status and verbalized understanding.

## 2020-12-29 NOTE — Consult Note (Signed)
[facility] Consult: 8:27 AM 12/29/2020  LOS: 1 day    Referring Provider: Dr Tish Frederickson Primary Care Physician:  Loura Back, NP Primary Gastroenterologist:  unassigned     Reason for Consultation: Melena, acute anemia in setting of new aspirin, Plavix.   HPI: Deborah Mcmahon is a 60 y.o. female.  PMH CKD 3.  Obesity.  Hypertension.  Smoker.  No previous colonoscopy or EGD.  Degenerative spine disease, chronic back pain.  Gallstones.  Substance and tobacco abuse.  12/21 - 11/17/20 admission with left-sided CVA, BP 2020/110 (had run out of BP meds 2 weeks prior ) tox screen positive cocaine.  Discharged on DAPT.  Plan was aspirin, Plavix for 3 months and then aspirin alone.  Return to ED today evening.  She has had several weeks of progressive fatigue, DOE.  Outpatient labs revealed dramatic drop in Hb and she was advised to proceed to the ED. because she was feeling poorly patient stopped taking aspirin and Plavix several days ago but continue taking her blood pressure medicine.  Appetite is good.  She had 1 black stool 2 to 3 weeks ago but other stools have been Ricco.  No blood per rectum.  No unusual bleeding or bruising.  No abdominal pain.  No change in bowel habits. FOBT negative.  Hgb 6.5 >> 1 PRBC >>  8.2.  Was ~ 14 6 weeks ago.  MCV 87.  Platelets, INR normal. Improved GFR.  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  Drinks alcohol on occasion.  Within the past couple of days she consumed one wine cooler.  She says at most she drinks two or three alcoholic drinks per month.  Denies use of cocaine since discharge.  Past Medical History:  Diagnosis Date  . Carpal tunnel syndrome    bilateral  . Chronic back pain    spondylolisthesis  . Constipation    takes an OTC stool softener daily as needed  .  Gallstones   . History of migraine   . Hypertension   . Nocturia   . Weakness    tingling     Past Surgical History:  Procedure Laterality Date  . BACK SURGERY     fusion  . CERVICAL SPINE SURGERY     x 4-fusion  . COLONOSCOPY    . MYRINGOTOMY    . right knee surgery     screws  . TONSILLECTOMY AND ADENOIDECTOMY    . TUBAL LIGATION      Prior to Admission medications   Medication Sig Start Date End Date Taking? Authorizing Provider  acetaminophen (TYLENOL) 500 MG tablet Take 500 mg by mouth every 6 (six) hours as needed for moderate pain or headache.   Yes [provider]  albuterol (VENTOLIN HFA) 108 (90 Base) MCG/ACT inhaler Inhale 2 puffs into the lungs every 6 (six) hours as needed for wheezing or shortness of breath.   Yes [provider]  allopurinol (ZYLOPRIM) 100 MG tablet Take 100 mg by mouth daily. 12/27/20  Yes [provider]  amLODipine (NORVASC)  10 MG tablet Take 1 tablet (10 mg total) by mouth daily. 11/17/20  Yes Lewie Chamber, MD  aspirin EC 81 MG EC tablet Take 1 tablet (81 mg total) by mouth daily. Swallow whole. 11/17/20  Yes Lewie Chamber, MD  atorvastatin (LIPITOR) 40 MG tablet Take 1 tablet (40 mg total) by mouth daily. 11/17/20  Yes Lewie Chamber, MD  buPROPion (WELLBUTRIN XL) 150 MG 24 hr tablet Take 150 mg by mouth every morning. 11/29/20  Yes [provider]  clopidogrel (PLAVIX) 75 MG tablet Take 1 tablet (75 mg total) by mouth daily. 11/17/20 02/15/21 Yes Lewie Chamber, MD  losartan-hydrochlorothiazide (HYZAAR) 100-12.5 MG tablet Take 1 tablet by mouth daily. 11/17/20  Yes Lewie Chamber, MD  meloxicam (MOBIC) 7.5 MG tablet Take 7.5 mg by mouth daily. 12/01/20  Yes [provider]  methocarbamol (ROBAXIN) 750 MG tablet Take 750 mg by mouth 2 (two) times daily as needed for muscle spasms.   Yes [provider]  metoprolol succinate (TOPROL-XL) 25 MG 24 hr tablet Take 25 mg by mouth daily. 12/27/20  Yes  [provider]  sucralfate (CARAFATE) 1 g tablet Take 1 g by mouth 4 (four) times daily. 12/14/20  Yes [provider]    Scheduled Meds: . amLODipine  10 mg Oral Daily  . atorvastatin  40 mg Oral Daily  . pantoprazole  40 mg Oral BID   Infusions:  PRN Meds: acetaminophen **OR** acetaminophen, ondansetron **OR** ondansetron (ZOFRAN) IV   Allergies as of 12/28/2020  . (No Known Allergies)    Family History  Problem Relation Age of Onset  . Anesthesia problems Neg Hx     Social History   Socioeconomic History  . Marital status: Single    Spouse name: Not on file  . Number of children: Not on file  . Years of education: Not on file  . Highest education level: Not on file  Occupational History  . Not on file  Tobacco Use  . Smoking status: Current Every Day Smoker    Packs/day: 0.50    Years: 41.00    Pack years: 20.50  . Smokeless tobacco: Never Used  Substance and Sexual Activity  . Alcohol use: No  . Drug use: No  . Sexual activity: Yes    Birth control/protection: Post-menopausal  Other Topics Concern  . Not on file  Social History Narrative  . Not on file   Social Determinants of Health   Financial Resource Strain: Not on file  Food Insecurity: Not on file  Transportation Needs: Not on file  Physical Activity: Not on file  Stress: Not on file  Social Connections: Not on file  Intimate Partner Violence: Not on file    REVIEW OF SYSTEMS: Constitutional: Weakness, fatigue ENT:  No nose bleeds Pulm: Dyspnea on exertion.  No cough. CV:  No palpitations, no LE edema.  No angina.   GU:  No hematuria, no frequency GI: See HPI Heme: See HPI Transfusions: No previous blood product transfusions. Neuro:  No headaches, no peripheral tingling or numbness.  No syncope, no seizures.  Mild dizziness with positional change. Derm:  No itching, no rash or sores.  Endocrine:  No sweats or chills.  No polyuria or dysuria Immunization: Not  queried.    PHYSICAL EXAM: Vital signs in last 24 hours: Vitals:   12/29/20 0500 12/29/20 0737  BP: (!) 171/93 (!) 158/89  Pulse: 87 81  Resp: 14 18  Temp:    SpO2: 97% 100%  Wt Readings from Last 3 Encounters:  11/15/20 98 kg  01/17/15 91.3 kg    General: Obese, somnolent, does not look acutely ill. Head: No facial asymmetry or swelling.  No signs of head trauma. Eyes: No scleral icterus.  No conjunctival pallor. Ears: Not hard of hearing Nose: Congestion or discharge. Mouth: Dental caries.  Tongue midline.  Mucosa moist, pink, clear. Neck: No JVD, masses, thyromegaly Lungs: Clear bilaterally.  No labored breathing.  No cough Heart: RRR.  No MRG. Abdomen: Soft, obese.  Active bowel sounds.  Not distended.  No HSM, masses, bruits, hernias.   Rectal: FOBT negative on previous DRE, did not repeat. Musc/Skeltl: No joint redness, swelling or gross deformity. Extremities: No CCE. Neurologic: Oriented x3.  Moves all four limbs.  Strength not tested.  No tremors.  No asterixis.  Falling asleep during exam and requiring repeated efforts to arouse her. Skin: No rash, no sores, no telangiectasia. Nodes: No cervical adenopathy Psych: Mumbling speech, difficult at times to understand.  Cooperative.  Calm, pleasant.  Intake/Output from previous day: 02/03 0701 - 02/04 0700 In: 315 [Blood:315] Out: -  Intake/Output this shift: No intake/output data recorded.  LAB RESULTS: Recent Labs    12/28/20 1903 12/29/20 0502  WBC 6.5 7.1  HGB 6.5* 8.2*  HCT 22.6* 27.1*  PLT 307 334   BMET Lab Results  Component Value Date   NA 140 12/29/2020   NA 139 12/28/2020   NA 135 11/17/2020   K 4.9 12/29/2020   K 4.9 12/28/2020   K 4.3 11/17/2020   CL 107 12/29/2020   CL 108 12/28/2020   CL 99 11/17/2020   CO2 19 (L) 12/29/2020   CO2 22 12/28/2020   CO2 25 11/17/2020   GLUCOSE 112 (H) 12/29/2020   GLUCOSE 95 12/28/2020   GLUCOSE 106 (H) 11/17/2020   BUN 21 (H) 12/29/2020   BUN  26 (H) 12/28/2020   BUN 26 (H) 11/17/2020   CREATININE 1.37 (H) 12/29/2020   CREATININE 1.64 (H) 12/28/2020   CREATININE 1.26 (H) 11/17/2020   CALCIUM 8.8 (L) 12/29/2020   CALCIUM 8.8 (L) 12/28/2020   CALCIUM 9.2 11/17/2020   LFT Recent Labs    12/28/20 1903  PROT 6.5  ALBUMIN 3.1*  AST 19  ALT 17  ALKPHOS 55  BILITOT 0.2*   PT/INR Lab Results  Component Value Date   INR 1.1 12/28/2020   INR 1.0 11/15/2020   INR 0.99 11/10/2009   Hepatitis Panel No results for input(s): HEPBSAG, HCVAB, HEPAIGM, HEPBIGM in the last 72 hours. C-Diff No components found for: CDIFF Lipase     Component Value Date/Time   LIPASE 25 04/13/2012 1400    Drugs of Abuse     Component Value Date/Time   LABOPIA NONE DETECTED 12/29/2020 0502   COCAINSCRNUR POSITIVE (A) 12/29/2020 0502   LABBENZ NONE DETECTED 12/29/2020 0502   AMPHETMU NONE DETECTED 12/29/2020 0502   THCU NONE DETECTED 12/29/2020 0502   LABBARB NONE DETECTED 12/29/2020 0502     RADIOLOGY STUDIES: DG Chest 2 View  Result Date: 12/28/2020 CLINICAL DATA:  Shortness of breath.  Low hemoglobin. EXAM: CHEST - 2 VIEW COMPARISON:  04/23/2012 FINDINGS: The heart is normal in size. Normal unchanged mediastinal contours. There is mild diffuse bronchial and interstitial thickening. No focal airspace disease. No significant pleural effusion. No pneumothorax. Surgical hardware in the lower cervical spine is partially included. IMPRESSION: Mild diffuse bronchial and interstitial thickening, may be pulmonary edema or infection. Electronically Signed  By: Melanie  Sanford M.D.   On: 12/28/2020 19:29      IMPRESSION:   *    Anemia.  Hgb decline of 6 gm over 6 weeks.  Single episode of black stool 3 weeks ago, currently FOBT negative.  *    CVA late 10/2020.  Prescribed aspirin/Plavix at discharge.  Stopped taking these several days ago due to feeling poorly.  *    Hypertension.  Hypertensive emergency on admission in December  2021.    PLAN:     *    Patient will not be able to have endoscopy today so she can eat.  I ordered clear diet.  N.p.o. after midnight with plans, if scheduling can accommodate, for EGD/?colon tomorrow.     Adanely Reynoso  12/29/2020, 8:27 AM Phone [phone]   

## 2020-12-29 NOTE — Progress Notes (Deleted)
[facility] Consult: 8:27 AM 12/29/2020  LOS: 1 day    Referring Provider: Dr Tish Frederickson Primary Care Physician:  Loura Back, NP Primary Gastroenterologist:  unassigned     Reason for Consultation: Melena, acute anemia in setting of new aspirin, Plavix.   HPI: Deborah Mcmahon is a 60 y.o. female.  PMH CKD 3.  Obesity.  Hypertension.  Smoker.  No previous colonoscopy or EGD.  Degenerative spine disease, chronic back pain.  Gallstones.  Substance and tobacco abuse.  12/21 - 11/17/20 admission with left-sided CVA, BP 2020/110 (had run out of BP meds 2 weeks prior ) tox screen positive cocaine.  Discharged on DAPT.  Plan was aspirin, Plavix for 3 months and then aspirin alone.  Return to ED today evening.  She has had several weeks of progressive fatigue, DOE.  Outpatient labs revealed dramatic drop in Hb and she was advised to proceed to the ED. because she was feeling poorly patient stopped taking aspirin and Plavix several days ago but continue taking her blood pressure medicine.  Appetite is good.  She had 1 black stool 2 to 3 weeks ago but other stools have been Ricco.  No blood per rectum.  No unusual bleeding or bruising.  No abdominal pain.  No change in bowel habits. FOBT negative.  Hgb 6.5 >> 1 PRBC >>  8.2.  Was ~ 14 6 weeks ago.  MCV 87.  Platelets, INR normal. Improved GFR.  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  Drinks alcohol on occasion.  Within the past couple of days she consumed one wine cooler.  She says at most she drinks two or three alcoholic drinks per month.  Denies use of cocaine since discharge.  Past Medical History:  Diagnosis Date  . Carpal tunnel syndrome    bilateral  . Chronic back pain    spondylolisthesis  . Constipation    takes an OTC stool softener daily as needed  .  Gallstones   . History of migraine   . Hypertension   . Nocturia   . Weakness    tingling     Past Surgical History:  Procedure Laterality Date  . BACK SURGERY     fusion  . CERVICAL SPINE SURGERY     x 4-fusion  . COLONOSCOPY    . MYRINGOTOMY    . right knee surgery     screws  . TONSILLECTOMY AND ADENOIDECTOMY    . TUBAL LIGATION      Prior to Admission medications   Medication Sig Start Date End Date Taking? Authorizing Provider  acetaminophen (TYLENOL) 500 MG tablet Take 500 mg by mouth every 6 (six) hours as needed for moderate pain or headache.   Yes [provider]  albuterol (VENTOLIN HFA) 108 (90 Base) MCG/ACT inhaler Inhale 2 puffs into the lungs every 6 (six) hours as needed for wheezing or shortness of breath.   Yes [provider]  allopurinol (ZYLOPRIM) 100 MG tablet Take 100 mg by mouth daily. 12/27/20  Yes [provider]  amLODipine (NORVASC)  10 MG tablet Take 1 tablet (10 mg total) by mouth daily. 11/17/20  Yes Lewie Chamber, MD  aspirin EC 81 MG EC tablet Take 1 tablet (81 mg total) by mouth daily. Swallow whole. 11/17/20  Yes Lewie Chamber, MD  atorvastatin (LIPITOR) 40 MG tablet Take 1 tablet (40 mg total) by mouth daily. 11/17/20  Yes Lewie Chamber, MD  buPROPion (WELLBUTRIN XL) 150 MG 24 hr tablet Take 150 mg by mouth every morning. 11/29/20  Yes [provider]  clopidogrel (PLAVIX) 75 MG tablet Take 1 tablet (75 mg total) by mouth daily. 11/17/20 02/15/21 Yes Lewie Chamber, MD  losartan-hydrochlorothiazide (HYZAAR) 100-12.5 MG tablet Take 1 tablet by mouth daily. 11/17/20  Yes Lewie Chamber, MD  meloxicam (MOBIC) 7.5 MG tablet Take 7.5 mg by mouth daily. 12/01/20  Yes [provider]  methocarbamol (ROBAXIN) 750 MG tablet Take 750 mg by mouth 2 (two) times daily as needed for muscle spasms.   Yes [provider]  metoprolol succinate (TOPROL-XL) 25 MG 24 hr tablet Take 25 mg by mouth daily. 12/27/20  Yes  [provider]  sucralfate (CARAFATE) 1 g tablet Take 1 g by mouth 4 (four) times daily. 12/14/20  Yes [provider]    Scheduled Meds: . amLODipine  10 mg Oral Daily  . atorvastatin  40 mg Oral Daily  . pantoprazole  40 mg Oral BID   Infusions:  PRN Meds: acetaminophen **OR** acetaminophen, ondansetron **OR** ondansetron (ZOFRAN) IV   Allergies as of 12/28/2020  . (No Known Allergies)    Family History  Problem Relation Age of Onset  . Anesthesia problems Neg Hx     Social History   Socioeconomic History  . Marital status: Single    Spouse name: Not on file  . Number of children: Not on file  . Years of education: Not on file  . Highest education level: Not on file  Occupational History  . Not on file  Tobacco Use  . Smoking status: Current Every Day Smoker    Packs/day: 0.50    Years: 41.00    Pack years: 20.50  . Smokeless tobacco: Never Used  Substance and Sexual Activity  . Alcohol use: No  . Drug use: No  . Sexual activity: Yes    Birth control/protection: Post-menopausal  Other Topics Concern  . Not on file  Social History Narrative  . Not on file   Social Determinants of Health   Financial Resource Strain: Not on file  Food Insecurity: Not on file  Transportation Needs: Not on file  Physical Activity: Not on file  Stress: Not on file  Social Connections: Not on file  Intimate Partner Violence: Not on file    REVIEW OF SYSTEMS: Constitutional: Weakness, fatigue ENT:  No nose bleeds Pulm: Dyspnea on exertion.  No cough. CV:  No palpitations, no LE edema.  No angina.   GU:  No hematuria, no frequency GI: See HPI Heme: See HPI Transfusions: No previous blood product transfusions. Neuro:  No headaches, no peripheral tingling or numbness.  No syncope, no seizures.  Mild dizziness with positional change. Derm:  No itching, no rash or sores.  Endocrine:  No sweats or chills.  No polyuria or dysuria Immunization: Not  queried.    PHYSICAL EXAM: Vital signs in last 24 hours: Vitals:   12/29/20 0500 12/29/20 0737  BP: (!) 171/93 (!) 158/89  Pulse: 87 81  Resp: 14 18  Temp:    SpO2: 97% 100%  Wt Readings from Last 3 Encounters:  11/15/20 98 kg  01/17/15 91.3 kg    General: Obese, somnolent, does not look acutely ill. Head: No facial asymmetry or swelling.  No signs of head trauma. Eyes: No scleral icterus.  No conjunctival pallor. Ears: Not hard of hearing Nose: Congestion or discharge. Mouth: Dental caries.  Tongue midline.  Mucosa moist, pink, clear. Neck: No JVD, masses, thyromegaly Lungs: Clear bilaterally.  No labored breathing.  No cough Heart: RRR.  No MRG. Abdomen: Soft, obese.  Active bowel sounds.  Not distended.  No HSM, masses, bruits, hernias.   Rectal: FOBT negative on previous DRE, did not repeat. Musc/Skeltl: No joint redness, swelling or gross deformity. Extremities: No CCE. Neurologic: Oriented x3.  Moves all four limbs.  Strength not tested.  No tremors.  No asterixis.  Falling asleep during exam and requiring repeated efforts to arouse her. Skin: No rash, no sores, no telangiectasia. Nodes: No cervical adenopathy Psych: Mumbling speech, difficult at times to understand.  Cooperative.  Calm, pleasant.  Intake/Output from previous day: 02/03 0701 - 02/04 0700 In: 315 [Blood:315] Out: -  Intake/Output this shift: No intake/output data recorded.  LAB RESULTS: Recent Labs    12/28/20 1903 12/29/20 0502  WBC 6.5 7.1  HGB 6.5* 8.2*  HCT 22.6* 27.1*  PLT 307 334   BMET Lab Results  Component Value Date   NA 140 12/29/2020   NA 139 12/28/2020   NA 135 11/17/2020   K 4.9 12/29/2020   K 4.9 12/28/2020   K 4.3 11/17/2020   CL 107 12/29/2020   CL 108 12/28/2020   CL 99 11/17/2020   CO2 19 (L) 12/29/2020   CO2 22 12/28/2020   CO2 25 11/17/2020   GLUCOSE 112 (H) 12/29/2020   GLUCOSE 95 12/28/2020   GLUCOSE 106 (H) 11/17/2020   BUN 21 (H) 12/29/2020   BUN  26 (H) 12/28/2020   BUN 26 (H) 11/17/2020   CREATININE 1.37 (H) 12/29/2020   CREATININE 1.64 (H) 12/28/2020   CREATININE 1.26 (H) 11/17/2020   CALCIUM 8.8 (L) 12/29/2020   CALCIUM 8.8 (L) 12/28/2020   CALCIUM 9.2 11/17/2020   LFT Recent Labs    12/28/20 1903  PROT 6.5  ALBUMIN 3.1*  AST 19  ALT 17  ALKPHOS 55  BILITOT 0.2*   PT/INR Lab Results  Component Value Date   INR 1.1 12/28/2020   INR 1.0 11/15/2020   INR 0.99 11/10/2009   Hepatitis Panel No results for input(s): HEPBSAG, HCVAB, HEPAIGM, HEPBIGM in the last 72 hours. C-Diff No components found for: CDIFF Lipase     Component Value Date/Time   LIPASE 25 04/13/2012 1400    Drugs of Abuse     Component Value Date/Time   LABOPIA NONE DETECTED 12/29/2020 0502   COCAINSCRNUR POSITIVE (A) 12/29/2020 0502   LABBENZ NONE DETECTED 12/29/2020 0502   AMPHETMU NONE DETECTED 12/29/2020 0502   THCU NONE DETECTED 12/29/2020 0502   LABBARB NONE DETECTED 12/29/2020 0502     RADIOLOGY STUDIES: DG Chest 2 View  Result Date: 12/28/2020 CLINICAL DATA:  Shortness of breath.  Low hemoglobin. EXAM: CHEST - 2 VIEW COMPARISON:  04/23/2012 FINDINGS: The heart is normal in size. Normal unchanged mediastinal contours. There is mild diffuse bronchial and interstitial thickening. No focal airspace disease. No significant pleural effusion. No pneumothorax. Surgical hardware in the lower cervical spine is partially included. IMPRESSION: Mild diffuse bronchial and interstitial thickening, may be pulmonary edema or infection. Electronically Signed  By: Melanie  Sanford M.D.   On: 12/28/2020 19:29      IMPRESSION:   *    Anemia.  Hgb decline of 6 gm over 6 weeks.  Single episode of black stool 3 weeks ago, currently FOBT negative.  *    CVA late 10/2020.  Prescribed aspirin/Plavix at discharge.  Stopped taking these several days ago due to feeling poorly.  *    Hypertension.  Hypertensive emergency on admission in December  2021.    PLAN:     *    Patient will not be able to have endoscopy today so she can eat.  I ordered clear diet.  N.p.o. after midnight with plans, if scheduling can accommodate, for EGD/?colon tomorrow.     Symphonie Schneiderman  12/29/2020, 8:27 AM Phone [phone]   

## 2020-12-29 NOTE — H&P (View-Only) (Signed)
[facility] Consult: 8:27 AM 12/29/2020  LOS: 1 day    Referring Provider: Dr Tish Frederickson Primary Care Physician:  Loura Back, NP Primary Gastroenterologist:  unassigned     Reason for Consultation: Melena, acute anemia in setting of new aspirin, Plavix.   HPI: Deborah Mcmahon is a 60 y.o. female.  PMH CKD 3.  Obesity.  Hypertension.  Smoker.  No previous colonoscopy or EGD.  Degenerative spine disease, chronic back pain.  Gallstones.  Substance and tobacco abuse.  12/21 - 11/17/20 admission with left-sided CVA, BP 2020/110 (had run out of BP meds 2 weeks prior ) tox screen positive cocaine.  Discharged on DAPT.  Plan was aspirin, Plavix for 3 months and then aspirin alone.  Return to ED today evening.  She has had several weeks of progressive fatigue, DOE.  Outpatient labs revealed dramatic drop in Hb and she was advised to proceed to the ED. because she was feeling poorly patient stopped taking aspirin and Plavix several days ago but continue taking her blood pressure medicine.  Appetite is good.  She had 1 black stool 2 to 3 weeks ago but other stools have been Ricco.  No blood per rectum.  No unusual bleeding or bruising.  No abdominal pain.  No change in bowel habits. FOBT negative.  Hgb 6.5 >> 1 PRBC >>  8.2.  Was ~ 14 6 weeks ago.  MCV 87.  Platelets, INR normal. Improved GFR.  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  Drinks alcohol on occasion.  Within the past couple of days she consumed one wine cooler.  She says at most she drinks two or three alcoholic drinks per month.  Denies use of cocaine since discharge.  Past Medical History:  Diagnosis Date  . Carpal tunnel syndrome    bilateral  . Chronic back pain    spondylolisthesis  . Constipation    takes an OTC stool softener daily as needed  .  Gallstones   . History of migraine   . Hypertension   . Nocturia   . Weakness    tingling     Past Surgical History:  Procedure Laterality Date  . BACK SURGERY     fusion  . CERVICAL SPINE SURGERY     x 4-fusion  . COLONOSCOPY    . MYRINGOTOMY    . right knee surgery     screws  . TONSILLECTOMY AND ADENOIDECTOMY    . TUBAL LIGATION      Prior to Admission medications   Medication Sig Start Date End Date Taking? Authorizing Provider  acetaminophen (TYLENOL) 500 MG tablet Take 500 mg by mouth every 6 (six) hours as needed for moderate pain or headache.   Yes [provider]  albuterol (VENTOLIN HFA) 108 (90 Base) MCG/ACT inhaler Inhale 2 puffs into the lungs every 6 (six) hours as needed for wheezing or shortness of breath.   Yes [provider]  allopurinol (ZYLOPRIM) 100 MG tablet Take 100 mg by mouth daily. 12/27/20  Yes [provider]  amLODipine (NORVASC)  10 MG tablet Take 1 tablet (10 mg total) by mouth daily. 11/17/20  Yes Lewie Chamber, MD  aspirin EC 81 MG EC tablet Take 1 tablet (81 mg total) by mouth daily. Swallow whole. 11/17/20  Yes Lewie Chamber, MD  atorvastatin (LIPITOR) 40 MG tablet Take 1 tablet (40 mg total) by mouth daily. 11/17/20  Yes Lewie Chamber, MD  buPROPion (WELLBUTRIN XL) 150 MG 24 hr tablet Take 150 mg by mouth every morning. 11/29/20  Yes [provider]  clopidogrel (PLAVIX) 75 MG tablet Take 1 tablet (75 mg total) by mouth daily. 11/17/20 02/15/21 Yes Lewie Chamber, MD  losartan-hydrochlorothiazide (HYZAAR) 100-12.5 MG tablet Take 1 tablet by mouth daily. 11/17/20  Yes Lewie Chamber, MD  meloxicam (MOBIC) 7.5 MG tablet Take 7.5 mg by mouth daily. 12/01/20  Yes [provider]  methocarbamol (ROBAXIN) 750 MG tablet Take 750 mg by mouth 2 (two) times daily as needed for muscle spasms.   Yes [provider]  metoprolol succinate (TOPROL-XL) 25 MG 24 hr tablet Take 25 mg by mouth daily. 12/27/20  Yes  [provider]  sucralfate (CARAFATE) 1 g tablet Take 1 g by mouth 4 (four) times daily. 12/14/20  Yes [provider]    Scheduled Meds: . amLODipine  10 mg Oral Daily  . atorvastatin  40 mg Oral Daily  . pantoprazole  40 mg Oral BID   Infusions:  PRN Meds: acetaminophen **OR** acetaminophen, ondansetron **OR** ondansetron (ZOFRAN) IV   Allergies as of 12/28/2020  . (No Known Allergies)    Family History  Problem Relation Age of Onset  . Anesthesia problems Neg Hx     Social History   Socioeconomic History  . Marital status: Single    Spouse name: Not on file  . Number of children: Not on file  . Years of education: Not on file  . Highest education level: Not on file  Occupational History  . Not on file  Tobacco Use  . Smoking status: Current Every Day Smoker    Packs/day: 0.50    Years: 41.00    Pack years: 20.50  . Smokeless tobacco: Never Used  Substance and Sexual Activity  . Alcohol use: No  . Drug use: No  . Sexual activity: Yes    Birth control/protection: Post-menopausal  Other Topics Concern  . Not on file  Social History Narrative  . Not on file   Social Determinants of Health   Financial Resource Strain: Not on file  Food Insecurity: Not on file  Transportation Needs: Not on file  Physical Activity: Not on file  Stress: Not on file  Social Connections: Not on file  Intimate Partner Violence: Not on file    REVIEW OF SYSTEMS: Constitutional: Weakness, fatigue ENT:  No nose bleeds Pulm: Dyspnea on exertion.  No cough. CV:  No palpitations, no LE edema.  No angina.   GU:  No hematuria, no frequency GI: See HPI Heme: See HPI Transfusions: No previous blood product transfusions. Neuro:  No headaches, no peripheral tingling or numbness.  No syncope, no seizures.  Mild dizziness with positional change. Derm:  No itching, no rash or sores.  Endocrine:  No sweats or chills.  No polyuria or dysuria Immunization: Not  queried.    PHYSICAL EXAM: Vital signs in last 24 hours: Vitals:   12/29/20 0500 12/29/20 0737  BP: (!) 171/93 (!) 158/89  Pulse: 87 81  Resp: 14 18  Temp:    SpO2: 97% 100%  Wt Readings from Last 3 Encounters:  11/15/20 98 kg  01/17/15 91.3 kg    General: Obese, somnolent, does not look acutely ill. Head: No facial asymmetry or swelling.  No signs of head trauma. Eyes: No scleral icterus.  No conjunctival pallor. Ears: Not hard of hearing Nose: Congestion or discharge. Mouth: Dental caries.  Tongue midline.  Mucosa moist, pink, clear. Neck: No JVD, masses, thyromegaly Lungs: Clear bilaterally.  No labored breathing.  No cough Heart: RRR.  No MRG. Abdomen: Soft, obese.  Active bowel sounds.  Not distended.  No HSM, masses, bruits, hernias.   Rectal: FOBT negative on previous DRE, did not repeat. Musc/Skeltl: No joint redness, swelling or gross deformity. Extremities: No CCE. Neurologic: Oriented x3.  Moves all four limbs.  Strength not tested.  No tremors.  No asterixis.  Falling asleep during exam and requiring repeated efforts to arouse her. Skin: No rash, no sores, no telangiectasia. Nodes: No cervical adenopathy Psych: Mumbling speech, difficult at times to understand.  Cooperative.  Calm, pleasant.  Intake/Output from previous day: 02/03 0701 - 02/04 0700 In: 315 [Blood:315] Out: -  Intake/Output this shift: No intake/output data recorded.  LAB RESULTS: Recent Labs    12/28/20 1903 12/29/20 0502  WBC 6.5 7.1  HGB 6.5* 8.2*  HCT 22.6* 27.1*  PLT 307 334   BMET Lab Results  Component Value Date   NA 140 12/29/2020   NA 139 12/28/2020   NA 135 11/17/2020   K 4.9 12/29/2020   K 4.9 12/28/2020   K 4.3 11/17/2020   CL 107 12/29/2020   CL 108 12/28/2020   CL 99 11/17/2020   CO2 19 (L) 12/29/2020   CO2 22 12/28/2020   CO2 25 11/17/2020   GLUCOSE 112 (H) 12/29/2020   GLUCOSE 95 12/28/2020   GLUCOSE 106 (H) 11/17/2020   BUN 21 (H) 12/29/2020   BUN  26 (H) 12/28/2020   BUN 26 (H) 11/17/2020   CREATININE 1.37 (H) 12/29/2020   CREATININE 1.64 (H) 12/28/2020   CREATININE 1.26 (H) 11/17/2020   CALCIUM 8.8 (L) 12/29/2020   CALCIUM 8.8 (L) 12/28/2020   CALCIUM 9.2 11/17/2020   LFT Recent Labs    12/28/20 1903  PROT 6.5  ALBUMIN 3.1*  AST 19  ALT 17  ALKPHOS 55  BILITOT 0.2*   PT/INR Lab Results  Component Value Date   INR 1.1 12/28/2020   INR 1.0 11/15/2020   INR 0.99 11/10/2009   Hepatitis Panel No results for input(s): HEPBSAG, HCVAB, HEPAIGM, HEPBIGM in the last 72 hours. C-Diff No components found for: CDIFF Lipase     Component Value Date/Time   LIPASE 25 04/13/2012 1400    Drugs of Abuse     Component Value Date/Time   LABOPIA NONE DETECTED 12/29/2020 0502   COCAINSCRNUR POSITIVE (A) 12/29/2020 0502   LABBENZ NONE DETECTED 12/29/2020 0502   AMPHETMU NONE DETECTED 12/29/2020 0502   THCU NONE DETECTED 12/29/2020 0502   LABBARB NONE DETECTED 12/29/2020 0502     RADIOLOGY STUDIES: DG Chest 2 View  Result Date: 12/28/2020 CLINICAL DATA:  Shortness of breath.  Low hemoglobin. EXAM: CHEST - 2 VIEW COMPARISON:  04/23/2012 FINDINGS: The heart is normal in size. Normal unchanged mediastinal contours. There is mild diffuse bronchial and interstitial thickening. No focal airspace disease. No significant pleural effusion. No pneumothorax. Surgical hardware in the lower cervical spine is partially included. IMPRESSION: Mild diffuse bronchial and interstitial thickening, may be pulmonary edema or infection. Electronically Signed  By: Narda Rutherford M.D.   On: 12/28/2020 19:29      IMPRESSION:   *    Anemia.  Hgb decline of 6 gm over 6 weeks.  Single episode of black stool 3 weeks ago, currently FOBT negative.  *    CVA late 10/2020.  Prescribed aspirin/Plavix at discharge.  Stopped taking these several days ago due to feeling poorly.  *    Hypertension.  Hypertensive emergency on admission in December  2021.    PLAN:     *    Patient will not be able to have endoscopy today so she can eat.  I ordered clear diet.  N.p.o. after midnight with plans, if scheduling can accommodate, for EGD/?colon tomorrow.     Jennye Moccasin  12/29/2020, 8:27 AM Phone [phone]

## 2020-12-29 NOTE — ED Notes (Signed)
Pt receiving blood  

## 2020-12-30 DIAGNOSIS — D62 Acute posthemorrhagic anemia: Secondary | ICD-10-CM | POA: Diagnosis not present

## 2020-12-30 LAB — CBC
HCT: 26.9 % — ABNORMAL LOW (ref 36.0–46.0)
Hemoglobin: 8.5 g/dL — ABNORMAL LOW (ref 12.0–15.0)
MCH: 26.6 pg (ref 26.0–34.0)
MCHC: 31.6 g/dL (ref 30.0–36.0)
MCV: 84.3 fL (ref 80.0–100.0)
Platelets: 308 10*3/uL (ref 150–400)
RBC: 3.19 MIL/uL — ABNORMAL LOW (ref 3.87–5.11)
RDW: 16.3 % — ABNORMAL HIGH (ref 11.5–15.5)
WBC: 6.9 10*3/uL (ref 4.0–10.5)
nRBC: 0 % (ref 0.0–0.2)

## 2020-12-30 MED ORDER — PEG-KCL-NACL-NASULF-NA ASC-C 100 G PO SOLR
0.5000 | Freq: Once | ORAL | Status: AC
  Administered 2020-12-30: 100 g via ORAL
  Filled 2020-12-30: qty 1

## 2020-12-30 MED ORDER — PEG-KCL-NACL-NASULF-NA ASC-C 100 G PO SOLR: 1.0000 | Freq: Once | ORAL | Status: DC

## 2020-12-30 MED ORDER — PEG-KCL-NACL-NASULF-NA ASC-C 100 G PO SOLR
0.5000 | Freq: Once | ORAL | Status: AC
  Administered 2020-12-31: 100 g via ORAL
  Filled 2020-12-30: qty 1

## 2020-12-30 NOTE — Progress Notes (Signed)
PROGRESS NOTE    Deborah Mcmahon  KWI:[id number] DOB: 19-Jul-1961 DOA: 12/28/2020 PCP: Arthur Holms, NP   Brief Narrative: Deborah Mcmahon is a 60 y.o. female with a history of hypertension, cocaine abuse, CKD stage IIIa, CVA. Patient presented secondary to hemoglobin of 8 at her PCPs office in setting of worsening fatigue and dyspnea on exertion. She was found to have acute blood loss anemia secondary to presumed upper GI bleed.  12/30/2020: Patient seen.  Discussed with GI team.  For EGD and colonoscopy tomorrow.  No bleeding reported.  Assessment & Plan:   Principal Problem:   Acute blood loss anemia Active Problems:   Essential hypertension   CVA (cerebral vascular accident) ([place])   CKD (chronic kidney disease), stage III ([facility])   Melena   Melena Hemoccult negative. Associated anemia. Patient is on aspirin and Plavix and takes Mobic as an outpatient. GI consulted on admission. -GI recommendations: EGD and colonoscopy tomorrow. -Continue Protonix  Acute blood loss anemia Baseline hemoglobin of 13-14. Secondary to presume GI bleeding (presumed upper). Patient presented with a hemoglobin of 6.5. She has received 1 unit of PRBC to date. Hemoglobin of 8.2. -Daily CBC. -Watch for recurrent bleeding 12/30/2020: Hemoglobin today is 8.5 g/dL.  AKI on CKD stage IIIa Baseline creatinine of about 1.1-1.2. Creatinine of 1.64 on admission and has improved. 12/30/2020: Renal function is improving.  Continue to monitor.  Primary hypertension Patient is on amlodipine and Hyzaar as an outpatient.  -Continue amlodipine and resume home Hyzaar 12/30/2020: Continue to monitor closely.  Goal blood pressure should be less than 130/80 mmHg.  Lethargy Severe. Per nursing, daughter states that patient is a hard sleeper. Could not interview patient effectively this morning. On room air with no concern for respiratory compromise. Likely has sleep apnea. -ABG 12/30/2020:  Resolved.  Hyperlipidemia -Continue Lipitor  History of stroke Unknown deficits if any. On aspirin and Plavix -Aspirin and Plavix held secondary to anemia and melena from presumed GI bleed  Gout -Continue allopurinol  Substance use Patient is cocaine positive   DVT prophylaxis: SCDs Code Status:   Code Status: Full Code Family Communication: None at bedside.  Disposition Plan: Discharge likely in several days pending stable hemoglobin in addition to GI recommendations  Consultants:   [facility]  Procedures:   None  Antimicrobials:  None    Subjective: Patient unable to provide history secondary to mental status.  Objective: Vitals:   12/29/20 2340 12/30/20 0535 12/30/20 0942 12/30/20 1158  BP: (!) 161/71 (!) 151/97 140/90 (!) 177/90  Pulse: 85 88  88  Resp: _0 Temp: 98.6 F (37 C) 98.8 F (37.1 C)  98.5 F (36.9 C)  TempSrc: Oral Oral  Oral  SpO2: 98% 98%  98%  Weight:      Height:       No intake or output data in the 24 hours ending 12/30/20 1511 Filed Weights   12/29/20 1508  Weight: 93.4 kg    Examination:  General exam: Appears calm and comfortable.  Patient is pale.  No jaundice. Respiratory system: Clear to auscultation. Respiratory effort normal. Cardiovascular system: S1 & S2. Gastrointestinal system: Abdomen is nondistended, soft and nontender. No organomegaly or masses felt. Normal bowel sounds heard. Central nervous system: Patient is awake and alert.  Patient moves all extremities. Musculoskeletal: No leg edema.    Data Reviewed: I have personally reviewed following labs and imaging studies  CBC Lab Results  Component Value Date  WBC 6.9 12/30/2020   RBC 3.19 (L) 12/30/2020   HGB 8.5 (L) 12/30/2020   HCT 26.9 (L) 12/30/2020   MCV 84.3 12/30/2020   MCH 26.6 12/30/2020   PLT 308 12/30/2020   MCHC 31.6 12/30/2020   RDW 16.3 (H) 12/30/2020   LYMPHSABS 3.0 11/17/2020   MONOABS 0.9 11/17/2020   EOSABS 0.1 11/17/2020    BASOSABS 0.0 01/75/1025     Last metabolic panel Lab Results  Component Value Date   NA 143 12/29/2020   K 4.5 12/29/2020   CL 107 12/29/2020   CO2 19 (L) 12/29/2020   BUN 21 (H) 12/29/2020   CREATININE 1.37 (H) 12/29/2020   GLUCOSE 112 (H) 12/29/2020   GFRNONAA 44 (L) 12/29/2020   GFRAA 64 (L) 02/17/2015   CALCIUM 8.8 (L) 12/29/2020   PHOS 4.4 11/16/2020   PROT 6.5 12/28/2020   ALBUMIN 3.1 (L) 12/28/2020   BILITOT 0.2 (L) 12/28/2020   ALKPHOS 55 12/28/2020   AST 19 12/28/2020   ALT 17 12/28/2020   ANIONGAP 14 12/29/2020    CBG (last 3)  No results for input(s): GLUCAP in the last 72 hours.   GFR: Estimated Creatinine Clearance: 48.4 mL/min (A) (by C-G formula based on SCr of 1.37 mg/dL (H)).  Coagulation Profile: Recent Labs  Lab 12/28/20 2105  INR 1.1    Recent Results (from the past 240 hour(s))  SARS Coronavirus 2 by RT PCR (hospital order, performed in [facility] hospital lab) Nasopharyngeal Nasopharyngeal Swab     Status: None   Collection Time: 12/28/20  8:59 PM   Specimen: Nasopharyngeal Swab  Result Value Ref Range Status   SARS Coronavirus 2 NEGATIVE NEGATIVE Final    Comment: (NOTE) SARS-CoV-2 target nucleic acids are NOT DETECTED.  The SARS-CoV-2 RNA is generally detectable in upper and lower respiratory specimens during the acute phase of infection. The lowest concentration of SARS-CoV-2 viral copies this assay can detect is 250 copies / mL. A negative result does not preclude SARS-CoV-2 infection and should not be used as the sole basis for treatment or other patient management decisions.  A negative result may occur with improper specimen collection / handling, submission of specimen other than nasopharyngeal swab, presence of viral mutation(s) within the areas targeted by this assay, and inadequate number of viral copies (<250 copies / mL). A negative result must be combined with clinical observations, patient history, and epidemiological  information.  Fact Sheet for Patients:   StrictlyIdeas.no  Fact Sheet for Healthcare Providers: BankingDealers.co.za  This test is not yet approved or  cleared by the [place] FDA and has been authorized for detection and/or diagnosis of SARS-CoV-2 by FDA under an Emergency Use Authorization (EUA).  This EUA will remain in effect (meaning this test can be used) for the duration of the COVID-19 declaration under Section 564(b)(1) of the Act, 21 U.S.C. section 360bbb-3(b)(1), unless the authorization is terminated or revoked sooner.  Performed at [facility], [place] [address], [place], [place] [address]         Radiology Studies: DG Chest 2 View  Result Date: 12/28/2020 CLINICAL DATA:  Shortness of breath.  Low hemoglobin. EXAM: CHEST - 2 VIEW COMPARISON:  04/23/2012 FINDINGS: The heart is normal in size. Normal unchanged mediastinal contours. There is mild diffuse bronchial and interstitial thickening. No focal airspace disease. No significant pleural effusion. No pneumothorax. Surgical hardware in the lower cervical spine is partially included. IMPRESSION: Mild diffuse bronchial and interstitial thickening, may be pulmonary edema  or infection. Electronically Signed   By: Keith Rake M.D.   On: 12/28/2020 19:29        Scheduled Meds: . allopurinol  100 mg Oral Daily  . amLODipine  10 mg Oral Daily  . atorvastatin  40 mg Oral Daily  . buPROPion  150 mg Oral q morning - 10a  . losartan  100 mg Oral Daily   And  . hydrochlorothiazide  12.5 mg Oral Daily  . pantoprazole  40 mg Oral BID  . peg 3350 powder  0.5 kit Oral Once   And  . [START ON 12/31/2020] peg 3350 powder  0.5 kit Oral Once   Continuous Infusions:   LOS: 2 days   Dana Allan, MD [facility] 12/30/2020, 3:11 PM  If 7PM-7AM, please contact night-coverage [email or web address]

## 2020-12-30 NOTE — Progress Notes (Signed)
Consent signed and placed in chart. Patient understood to be NPO after midnight. Patient voice that her stool is clear. Will continue to initiate 4AM bowel prep.  Sim Boast, RN

## 2020-12-31 ENCOUNTER — Encounter (HOSPITAL_COMMUNITY): Payer: Self-pay | Admitting: Certified Registered"

## 2020-12-31 ENCOUNTER — Encounter (HOSPITAL_COMMUNITY): Payer: Self-pay | Admitting: Internal Medicine

## 2020-12-31 ENCOUNTER — Encounter (HOSPITAL_COMMUNITY)
Admission: EM | Discharge status: Against Medical Advice | Payer: Self-pay | Source: Home / Self Care | Attending: Internal Medicine

## 2020-12-31 DIAGNOSIS — K2970 Gastritis, unspecified, without bleeding: Secondary | ICD-10-CM

## 2020-12-31 DIAGNOSIS — D62 Acute posthemorrhagic anemia: Secondary | ICD-10-CM | POA: Diagnosis not present

## 2020-12-31 DIAGNOSIS — K259 Gastric ulcer, unspecified as acute or chronic, without hemorrhage or perforation: Secondary | ICD-10-CM

## 2020-12-31 DIAGNOSIS — K635 Polyp of colon: Secondary | ICD-10-CM

## 2020-12-31 HISTORY — PX: ESOPHAGOGASTRODUODENOSCOPY (EGD) WITH PROPOFOL: SHX5813

## 2020-12-31 HISTORY — PX: POLYPECTOMY: SHX5525

## 2020-12-31 HISTORY — PX: COLONOSCOPY WITH PROPOFOL: SHX5780

## 2020-12-31 HISTORY — PX: BIOPSY: SHX5522

## 2020-12-31 LAB — GLUCOSE, CAPILLARY: Glucose-Capillary: 83 mg/dL (ref 70–99)

## 2020-12-31 SURGERY — ESOPHAGOGASTRODUODENOSCOPY (EGD) WITH PROPOFOL
Anesthesia: Monitor Anesthesia Care

## 2020-12-31 MED ORDER — MIDAZOLAM HCL (PF) 10 MG/2ML IJ SOLN
INTRAMUSCULAR | Status: DC | PRN
  Administered 2020-12-31 (×2): 2 mg via INTRAVENOUS
  Administered 2020-12-31 (×3): 1 mg via INTRAVENOUS

## 2020-12-31 MED ORDER — DIPHENHYDRAMINE HCL 50 MG/ML IJ SOLN
INTRAMUSCULAR | Status: DC | PRN
  Administered 2020-12-31 (×2): 25 mg via INTRAVENOUS

## 2020-12-31 MED ORDER — SPOT INK MARKER SYRINGE KIT
PACK | SUBMUCOSAL | Status: AC
  Filled 2020-12-31: qty 5

## 2020-12-31 MED ORDER — DIPHENHYDRAMINE HCL 50 MG/ML IJ SOLN
INTRAMUSCULAR | Status: AC
  Filled 2020-12-31: qty 1

## 2020-12-31 MED ORDER — SODIUM CHLORIDE 0.9 % IV SOLN
510.0000 mg | Freq: Once | INTRAVENOUS | Status: AC
  Administered 2020-12-31: 510 mg via INTRAVENOUS
  Filled 2020-12-31: qty 17

## 2020-12-31 MED ORDER — FENTANYL CITRATE (PF) 100 MCG/2ML IJ SOLN
INTRAMUSCULAR | Status: AC
  Filled 2020-12-31: qty 2

## 2020-12-31 MED ORDER — FENTANYL CITRATE (PF) 100 MCG/2ML IJ SOLN
INTRAMUSCULAR | Status: AC
  Filled 2020-12-31: qty 4

## 2020-12-31 MED ORDER — FENTANYL CITRATE (PF) 100 MCG/2ML IJ SOLN
INTRAMUSCULAR | Status: DC | PRN
  Administered 2020-12-31 (×3): 25 ug via INTRAVENOUS
  Administered 2020-12-31: 75 ug via INTRAVENOUS
  Administered 2020-12-31: 25 ug via INTRAVENOUS

## 2020-12-31 MED ORDER — SODIUM CHLORIDE 0.9 % IV SOLN: INTRAVENOUS | Status: DC

## 2020-12-31 MED ORDER — EPINEPHRINE 1 MG/10ML IJ SOSY
PREFILLED_SYRINGE | INTRAMUSCULAR | Status: AC
  Filled 2020-12-31: qty 10

## 2020-12-31 MED ORDER — MIDAZOLAM HCL (PF) 5 MG/ML IJ SOLN
INTRAMUSCULAR | Status: AC
  Filled 2020-12-31: qty 2

## 2020-12-31 SURGICAL SUPPLY — 25 items

## 2020-12-31 NOTE — Op Note (Signed)
[facility] Patient Name: Deborah Mcmahon Procedure Date : 12/31/2020 MRN: [id number] Attending MD: Justice Britain , MD Date of Birth: August 25, 1961 CSN: [id number] Age: 60 Admit Type: Inpatient Procedure:                Colonoscopy Indications:              This is the patient's first colonoscopy, Iron                            deficiency anemia Providers:                Justice Britain, MD, Glori Bickers, RN, Ladona Ridgel, Technician, Tyna Jaksch Technician Referring MD:             Arrie Eastern, [facility] Medicines:                Diphenhydramine 50 mg IV, Fentanyl 175 micrograms                            IV, Midazolam 7 mg IV - Total medications for both                            EGD/Colonoscopy combined Complications:            No immediate complications. Estimated Blood Loss:     Estimated blood loss was minimal. Procedure:                Pre-Anesthesia Assessment:                           - Prior to the procedure, a History and Physical                            was performed, and patient medications and                            allergies were reviewed. The patient's tolerance of                            previous anesthesia was also reviewed. The risks                            and benefits of the procedure and the sedation                            options and risks were discussed with the patient.                            All questions were answered, and informed consent                            was obtained. Prior Anticoagulants: The patient has  taken Plavix (clopidogrel), last dose was 10 days                            prior to procedure. ASA Grade Assessment: III - A                            patient with severe systemic disease. After                            reviewing the risks and benefits, the patient was                            deemed in satisfactory condition  to undergo the                            procedure.                           After obtaining informed consent, the colonoscope                            was passed under direct vision. Throughout the                            procedure, the patient's blood pressure, pulse, and                            oxygen saturations were monitored continuously. The                            CF-HQ190L ([id number]) Olympus colonoscope was                            introduced through the anus and advanced to the 5                            cm into the ileum. The colonoscopy was somewhat                            difficult due to restricted mobility of the colon.                            Successful completion of the procedure was aided by                            increasing the dose of sedation medication,                            changing the patient's position, using manual                            pressure, withdrawing and reinserting the scope,  straightening and shortening the scope to obtain                            bowel loop reduction and using scope torsion. Scope In: 5:29:30 PM Scope Out: 5:53:05 PM Scope Withdrawal Time: 0 hours 9 minutes 34 seconds  Total Procedure Duration: 0 hours 23 minutes 35 seconds  Findings:      The digital rectal exam findings include hemorrhoids. Pertinent       negatives include no palpable rectal lesions.      The terminal ileum and ileocecal valve appeared normal.      A 5 mm polyp was found in the descending colon. The polyp was sessile.       The polyp was removed with a cold snare. Resection and retrieval were       complete.      Many small and large-mouthed diverticula were found in the recto-sigmoid       colon, sigmoid colon and descending colon.      The left colon was significantly tortuous as a result of the above noted       diverticular disease.      Normal mucosa was found in the entire colon otherwise.       Non-bleeding non-thrombosed external and internal hemorrhoids were found       during retroflexion, during perianal exam and during digital exam. The       hemorrhoids were Grade II (internal hemorrhoids that prolapse but reduce       spontaneously). Impression:               - Hemorrhoids found on digital rectal exam.                           - The examined portion of the ileum was normal.                           - One 5 mm polyp in the descending colon, removed                            with a cold snare. Resected and retrieved.                           - Diverticulosis in the recto-sigmoid colon, in the                            sigmoid colon and in the descending colon.                           - Tortuous left colon as a result of the                            diverticular disease.                           - Normal mucosa in the entire examined colon                            otherwise.                           -  Non-bleeding non-thrombosed external and internal                            hemorrhoids. Recommendation:           - The patient will be observed post-procedure,                            until all discharge criteria are met.                           - Return patient to hospital ward for ongoing care.                           - High fiber diet.                           - Use FiberCon 1-2 tablets PO daily.                           - May consider restarting Plavix within next 24-48                            hours.                           - No aspirin, ibuprofen, naproxen, or other                            non-steroidal anti-inflammatory drugs.                           - Continue present medications.                           - Await pathology results.                           - Repeat colonoscopy in 03/31/09 years for                            surveillance based on pathology results.                           - Trend Hgb/Hct while inhouse.                            - Unless patient has transfusion dependent anemia                            while inpatient would not pursue a VCE.                           - Patient should follow up with PCP within 1-2                            weeks of discharge to monitor Hgb especially  after                            restart of Plavix.                           - We will work on follow up in 4-8 weeks in GI                            clinic and if patient still has evidence of IDA or                            persistent anemia may consider VCE.                           - Patient should receive at least 1 dose of IV Iron                            while in house if not already administered.                           - The findings and recommendations were discussed                            with the patient.                           - The findings and recommendations were discussed                            with the referring physician. Procedure Code(s):        --- Professional ---                           [phone], Colonoscopy, flexible; with removal of                            tumor(s), polyp(s), or other lesion(s) by snare                            technique Diagnosis Code(s):        --- Professional ---                           K64.1, Second degree hemorrhoids                           K63.5, Polyp of colon                           D50.9, Iron deficiency anemia, unspecified                           K57.30, Diverticulosis of large intestine without  perforation or abscess without bleeding                           Q43.8, Other specified congenital malformations of                            intestine CPT copyright 2019 American Medical Association. All rights reserved. The codes documented in this report are preliminary and upon coder review may  be revised to meet current compliance requirements. Justice Britain, MD 12/31/2020 6:11:25 PM Number of  Addenda: 0

## 2020-12-31 NOTE — Interval H&P Note (Signed)
History and Physical Interval Note:  12/31/2020 5:04 PM  Deborah Mcmahon  has presented today for surgery, with the diagnosis of anemia..  The various methods of treatment have been discussed with the patient and family. After consideration of risks, benefits and other options for treatment, the patient has consented to  Procedure(s): ESOPHAGOGASTRODUODENOSCOPY (EGD) WITH PROPOFOL (N/A) COLONOSCOPY WITH PROPOFOL (N/A) as a surgical intervention.  The patient's history has been reviewed, patient examined, no change in status, stable for surgery.  I have reviewed the patient's chart and labs.  Questions were answered to the patient's satisfaction.     [employer]

## 2020-12-31 NOTE — Plan of Care (Signed)
Contacted by Dr. Sallee Provencal that pt admitted for melena and severe anemia with Hb 6.5. pt is undergoing GI work up of EGD and colonoscopy. 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 she was put on ASA and plavix DAPT for 3 months on discharge. Currently, both are on hold for anemia and GIB.   Once anemia stable and pt deemed safe from GI standpoint, antiplatelet can be resumed for stroke prevention.  Pt has appointment with [facility] on 01/11/21. Will let them know about this development.   Marvel Plan, MD PhD Stroke Neurology 12/31/2020 2:21 PM

## 2020-12-31 NOTE — Op Note (Signed)
[facility] Patient Name: [place] Procedure Date : 12/31/2020 MRN: [id number] Attending MD: Corliss Parish , MD Date of Birth: [birth year] CSN: [id number] Age: 60 Admit Type: Inpatient Procedure:                Upper GI endoscopy Indications:              Iron deficiency anemia, Melena, Suspected upper                            gastrointestinal bleeding Providers:                Corliss Parish, MD, Glory Rosebush, RN, Michele Mcalpine Technician Referring MD:             [facility], Gerald Dexter Medicines:                Diphenhydramine 50 mg IV, Fentanyl 175 micrograms                            IV, Midazolam 7 mg IV - Total medications for both                            EGD/Colonoscopy combined Complications:            No immediate complications. Estimated Blood Loss:     Estimated blood loss was minimal. Procedure:                Pre-Anesthesia Assessment:                           - Prior to the procedure, a History and Physical                            was performed, and patient medications and                            allergies were reviewed. The patient's tolerance of                            previous anesthesia was also reviewed. The risks                            and benefits of the procedure and the sedation                            options and risks were discussed with the patient.                            All questions were answered, and informed consent                            was obtained. Prior Anticoagulants: The patient has  taken Plavix (clopidogrel), last dose was 10 days                            prior to procedure. ASA Grade Assessment: III - A                            patient with severe systemic disease. After                            reviewing the risks and benefits, the patient was                            deemed in satisfactory condition to undergo  the                            procedure.                           After obtaining informed consent, the endoscope was                            passed under direct vision. Throughout the                            procedure, the patient's blood pressure, pulse, and                            oxygen saturations were monitored continuously. The                            GIF-H190 ([id number]) Olympus gastroscope was                            introduced through the mouth, and advanced to the                            second part of duodenum. The upper GI endoscopy was                            accomplished without difficulty. The patient                            tolerated the procedure. Scope In: Scope Out: Findings:      No gross lesions were noted in the entire esophagus.      The Z-line was regular and was found 39 cm from the incisors.      A 1 cm hiatal hernia was present.      Patchy mild inflammation characterized by erosions, erythema and       friability was found in the entire examined stomach.      One non-bleeding superficial gastric ulcer with a clean ulcer base       (Forrest Class III) was found in the prepyloric region of the stomach.       The lesion was 8 mm in largest dimension.  No other gross lesions were noted in the entire examined stomach.       Biopsies were taken with a cold forceps for histology and Helicobacter       pylori testing.      No gross lesions were noted in the duodenal bulb, in the first portion       of the duodenum and in the second portion of the duodenum. Biopsies for       histology were taken with a cold forceps for evaluation of celiac       disease. Impression:               - No gross lesions in esophagus. Z-line regular, 39                            cm from the incisors.                           - Gastritis. Non-bleeding gastric ulcer with a                            clean ulcer base (Forrest Class III). No other                             gross lesions in the stomach. Biopsied.                           - No gross lesions in the duodenal bulb, in the                            first portion of the duodenum and in the second                            portion of the duodenum. Biopsied. Moderate Sedation:      Moderate (conscious) sedation was administered by the endoscopy nurse       and supervised by the endoscopist. The following parameters were       monitored: oxygen saturation, heart rate, blood pressure, and response       to care. Total physician intraservice time was 45 minutes (total for       both procedures. Recommendation:           - Proceed to scheduled colonoscopy.                           - Observe patient's clinical course.                           - Await pathology results to rule out HP and Celiac.                           - Repeat upper endoscopy in 3-4 months to check                            healing of gastric ulcer.                           -  Protonix 40 mg twice daily for next few months.                           - May continue Carafate 1 g with meals and at                            bedtime (make sure to not take any medication 1                            hour before or after taking Carafate though).                           - The findings and recommendations were discussed                            with the patient.                           - The findings and recommendations were discussed                            with the referring physician. Procedure Code(s):        --- Professional ---                           [phone], Esophagogastroduodenoscopy, flexible,                            transoral; with biopsy, single or multiple                           99153, Moderate sedation; each additional 15                            minutes intraservice time                           99153, Moderate sedation; each additional 15                            minutes intraservice  time                           G0500, Moderate sedation services provided by the                            same physician or other qualified health care                            professional performing a gastrointestinal                            endoscopic service that sedation supports,                            requiring the  presence of an independent trained                            observer to assist in the monitoring of the                            patient's level of consciousness and physiological                            status; initial 15 minutes of intra-service time;                            patient age 50 years or older (additional time may                            be reported with 26333, as appropriate) Diagnosis Code(s):        --- Professional ---                           K29.70, Gastritis, unspecified, without bleeding                           K25.9, Gastric ulcer, unspecified as acute or                            chronic, without hemorrhage or perforation                           D50.9, Iron deficiency anemia, unspecified                           K92.1, Melena (includes Hematochezia) CPT copyright 2019 American Medical Association. All rights reserved. The codes documented in this report are preliminary and upon coder review may  be revised to meet current compliance requirements. Corliss Parish, MD 12/31/2020 6:03:41 PM Number of Addenda: 0

## 2020-12-31 NOTE — Progress Notes (Signed)
PROGRESS NOTE    Deborah Mcmahon  XFG:[id number] DOB: 1961-07-08 DOA: 12/28/2020 PCP: Loura Back, NP   Brief Narrative: Deborah Mcmahon is a 60 y.o. female with a history of hypertension, cocaine abuse, CKD stage IIIa, CVA. Patient presented secondary to hemoglobin of 8 at her PCPs office in setting of worsening fatigue and dyspnea on exertion. She was found to have acute blood loss anemia secondary to presumed upper GI bleed.  Apparently, patient was on aspirin, examined meloxicam prior to admission.  Aspirin and Plavix were started after patient had a stroke in December of last year.  GI team was consulted to assist with patient's management.  Patient underwent EGD and colonoscopy today.  Nonbleeding prepyloric ulcer, gastritis, single colonic polyp and diverticulosis were noted.  Patient will be given 1 dose of IV ferumoxytol 510 Mg today.  As per GI team, patient may resume only Plavix in 24 to 48 hours.  Also discussed with neurology team, Dr. Roda Shutters, Dr. Roda Shutters has deferred antiplatelet management to the GI team.  Assessment & Plan:   Principal Problem:   Acute blood loss anemia Active Problems:   Essential hypertension   CVA (cerebral vascular accident) (HCC)   CKD (chronic kidney disease), stage III (HCC)   Melena   Melena Hemoccult negative. Associated anemia. Patient is on aspirin and Plavix and takes Mobic as an outpatient. GI consulted on admission. -See above.  EGD and colonoscopy revealed nonbleeding prepyloric gastric ulcer, gastritis, single colonic polyp and diverticulosis. -Patient will be given 1 dose of ferumoxytol. -According to GI recommendation, patient may restart Plavix in 24 to 48 hours.  High-dose PPI and Carafate. -Likely discharge tomorrow if patient continues to do well. -GI recommendations: EGD and colonoscopy tomorrow.  Acute blood loss anemia Baseline hemoglobin of 13-14. Secondary to presume GI bleeding (presumed upper). Patient presented with a hemoglobin of  6.5. She has received 1 unit of PRBC to date. Hemoglobin of 8.2. -Daily CBC. -See above documentation.  AKI on CKD stage IIIa Baseline creatinine of about 1.1-1.2. Creatinine of 1.64 on admission and has improved. 12/30/2020: Renal function is improving.  Continue to monitor.  Primary hypertension Patient is on amlodipine and Hyzaar as an outpatient.  -Continue amlodipine and resume home Hyzaar 12/30/2020: Continue to monitor closely.  Goal blood pressure should be less than 130/80 mmHg.  Lethargy Resolved.    Hyperlipidemia -Continue Lipitor  History of stroke -Patient has stroke late last year. -Patient was on aspirin and Plavix following the stroke. -GI has recommended starting Plavix only in 24 to 48 hours.    Gout -Continue allopurinol  Substance use Patient is cocaine positive  DVT prophylaxis: SCDs Code Status:   Code Status: Full Code Family Communication: None at bedside.  Disposition Plan: Likely tomorrow if patient remains stable.  Consultants:   [facility]  Procedures:   None  Antimicrobials:  None    Subjective: No further bleeding. No new complaints.  .  Objective: Vitals:   12/31/20 1700 12/31/20 1806 12/31/20 1816 12/31/20 1823  BP: (!) 170/99 (!) 141/94 (!) 155/101 (!) 167/92  Pulse: 87 80 81 73  Resp: 17 15 15 11   Temp:  97.8 F (36.6 C)    TempSrc:  Oral    SpO2: 99% 100% 100% 100%  Weight:      Height:        Intake/Output Summary (Last 24 hours) at 12/31/2020 1830 Last data filed at 12/31/2020 0430 Gross per 24 hour  Intake 720 ml  Output --  Net 720 ml   Filed Weights   12/29/20 1508 12/31/20 1431  Weight: 93.4 kg 93.4 kg    Examination: General exam: Appears calm and comfortable.  Patient is pale.  No jaundice. Respiratory system: Clear to auscultation. Respiratory effort normal. Cardiovascular system: S1 & S2. Gastrointestinal system: Abdomen is nondistended, soft and nontender. No organomegaly or masses felt. Normal  bowel sounds heard. Central nervous system: Patient is awake and alert.  Patient moves all extremities. Musculoskeletal: No leg edema.    Data Reviewed: I have personally reviewed following labs and imaging studies  CBC Lab Results  Component Value Date   WBC 6.9 12/30/2020   RBC 3.19 (L) 12/30/2020   HGB 8.5 (L) 12/30/2020   HCT 26.9 (L) 12/30/2020   MCV 84.3 12/30/2020   MCH 26.6 12/30/2020   PLT 308 12/30/2020   MCHC 31.6 12/30/2020   RDW 16.3 (H) 12/30/2020   LYMPHSABS 3.0 11/17/2020   MONOABS 0.9 11/17/2020   EOSABS 0.1 11/17/2020   BASOSABS 0.0 11/17/2020     Last metabolic panel Lab Results  Component Value Date   NA 143 12/29/2020   K 4.5 12/29/2020   CL 107 12/29/2020   CO2 19 (L) 12/29/2020   BUN 21 (H) 12/29/2020   CREATININE 1.37 (H) 12/29/2020   GLUCOSE 112 (H) 12/29/2020   GFRNONAA 44 (L) 12/29/2020   GFRAA 64 (L) 02/17/2015   CALCIUM 8.8 (L) 12/29/2020   PHOS 4.4 11/16/2020   PROT 6.5 12/28/2020   ALBUMIN 3.1 (L) 12/28/2020   BILITOT 0.2 (L) 12/28/2020   ALKPHOS 55 12/28/2020   AST 19 12/28/2020   ALT 17 12/28/2020   ANIONGAP 14 12/29/2020    CBG (last 3)  Recent Labs    12/31/20 1806  GLUCAP 83     GFR: Estimated Creatinine Clearance: 48.4 mL/min (A) (by C-G formula based on SCr of 1.37 mg/dL (H)).  Coagulation Profile: Recent Labs  Lab 12/28/20 2105  INR 1.1    Recent Results (from the past 240 hour(s))  SARS Coronavirus 2 by RT PCR (hospital order, performed in [facility] hospital lab) Nasopharyngeal Nasopharyngeal Swab     Status: None   Collection Time: 12/28/20  8:59 PM   Specimen: Nasopharyngeal Swab  Result Value Ref Range Status   SARS Coronavirus 2 NEGATIVE NEGATIVE Final    Comment: (NOTE) SARS-CoV-2 target nucleic acids are NOT DETECTED.  The SARS-CoV-2 RNA is generally detectable in upper and lower respiratory specimens during the acute phase of infection. The lowest concentration of SARS-CoV-2 viral copies this  assay can detect is 250 copies / mL. A negative result does not preclude SARS-CoV-2 infection and should not be used as the sole basis for treatment or other patient management decisions.  A negative result may occur with improper specimen collection / handling, submission of specimen other than nasopharyngeal swab, presence of viral mutation(s) within the areas targeted by this assay, and inadequate number of viral copies (<250 copies / mL). A negative result must be combined with clinical observations, patient history, and epidemiological information.  Fact Sheet for Patients:   BoilerBrush.com.cy  Fact Sheet for Healthcare Providers: https://pope.com/  This test is not yet approved or  cleared by the [place] FDA and has been authorized for detection and/or diagnosis of SARS-CoV-2 by FDA under an Emergency Use Authorization (EUA).  This EUA will remain in effect (meaning this test can be used) for the duration of the COVID-19 declaration under Section 564(b)(1) of the  Act, 21 U.S.C. section 360bbb-3(b)(1), unless the authorization is terminated or revoked sooner.  Performed at [facility], [address], [place], [place] [address]         Radiology Studies: No results found.      Scheduled Meds: . allopurinol  100 mg Oral Daily  . amLODipine  10 mg Oral Daily  . atorvastatin  40 mg Oral Daily  . buPROPion  150 mg Oral q morning - 10a  . losartan  100 mg Oral Daily   And  . hydrochlorothiazide  12.5 mg Oral Daily  . pantoprazole  40 mg Oral BID   Continuous Infusions: . ferumoxytol       LOS: 3 days   Berton Mount, MD [facility] 12/31/2020, 6:30 PM  If 7PM-7AM, please contact night-coverage [email or web address]

## 2021-01-01 DIAGNOSIS — D62 Acute posthemorrhagic anemia: Secondary | ICD-10-CM | POA: Diagnosis not present

## 2021-01-01 NOTE — Discharge Summary (Signed)
Physician Discharge Summary  Deborah Mcmahon DSK:[id number] DOB: [birth year] DOA: 12/28/2020  PCP: Loura Back, NP  Admit date: 12/28/2020   Discharge date: 01/01/2021  Admitted From: Home.  Disposition:   Left against medical advice.  Recommendations for Outpatient Follow-up:  1. Follow up with PCP in 1-2 weeks 2. Please obtain BMP/CBC in one week 3. Left against medical advice.  Home Health: None. Equipment/Devices: None  Discharge Condition: Stable CODE STATUS:Full code Diet recommendation: Heart Healthy   Brief/Interim Summary: This 60 y.o.female with history of hypertension, cocaine abuse, CKD stage IIIa, CVA. Patient was sent from PCPs office for significant anemia with Hb of 8.0  in setting of worsening fatigue and dyspnea on exertion. She was found to have acute blood loss anemia secondary to presumed upper GI bleed.Apparently, patient was on aspirin, plavix and meloxicam prior to admission. Aspirin and Plavix were started after patient had a stroke in December of last year. GI team was consulted to assist with patient's management. Patient underwent EGD and colonoscopy  12/31/20. Nonbleeding prepyloric ulcer, gastritis, single colonic polyp and diverticulosis were noted.  Plavix and aspirin was kept on hold. Patient will be given 1 dose of IV ferumoxytol 510 Mg.  As per GI team, patient may resume only Plavix in 24 to 48 hours. Also discussed with neurology team, Dr. Roda Shutters, Dr.Xuhas deferred antiplatelet management to the GI team.  GI recommended to resume Plavix and observe for 24 hours for recurrent bleeding.  Patient seems frustrated and want to be discharged.  Explained to the patient in detail about the importance of observation post procedure and with resumption of Plavix.  Patient understood the risks and benefit and she left AGAINST MEDICAL ADVICE.  She was managed for below problems.  Discharge Diagnoses:  Principal Problem:   Acute blood loss anemia Active  Problems:   Essential hypertension   CVA (cerebral vascular accident) (HCC)   CKD (chronic kidney disease), stage III (HCC)   Melena  Melena :: Resolved. Hemoccult negative. Associated anemia.  Patient was on aspirin and Plavix and takes Mobic as an outpatient. GI consulted on admission. EGD and colonoscopy revealed nonbleeding prepyloric gastric ulcer, gastritis, single colonic polyp and diverticulosis. Patient has recieved 1 dose of ferumoxytol. According to GI recommendation, patient may restart Plavix in 24 to 48 hours. High-dose PPI and Carafate. -Likely discharge tomorrow if patient continues to do well.  Acute blood loss anemia Baseline hemoglobin of 13-14. Secondary to presume GI bleeding (presumed upper).  Patient presented with a hemoglobin of 6.5. She has received 1 unit of PRBC to date. Hemoglobin of 8.2. -Daily CBC.  Hb remained stable.  AKI on CKD stage IIIa Baseline creatinine of about 1.1-1.2. Creatinine of 1.64 on admission and has improved. 12/30/2020: Renal function is improving. Continue to monitor.  Primary hypertension Patient is on amlodipine and Hyzaar as an outpatient.  -Continue amlodipine and resume home Hyzaar 12/30/2020: Continue to monitor closely. Goal blood pressure should be less than 130/80 mmHg.  Lethargy Resolved.  Hyperlipidemia -Continue Lipitor  History of stroke -Patient has stroke late last year. -Patient was on aspirin and Plavix following the stroke. -GI has recommended starting Plavix only in 24 to 48 hours.  Gout -Continue allopurinol  Substance use Patient is cocaine positive  Discharge Instructions   Allergies as of 01/01/2021   No Known Allergies     Medication List    ASK your doctor about these medications   acetaminophen 500 MG tablet Commonly known as: TYLENOL Take 500  mg by mouth every 6 (six) hours as needed for moderate pain or headache.   albuterol 108 (90 Base) MCG/ACT inhaler Commonly known  as: VENTOLIN HFA Inhale 2 puffs into the lungs every 6 (six) hours as needed for wheezing or shortness of breath.   allopurinol 100 MG tablet Commonly known as: ZYLOPRIM Take 100 mg by mouth daily.   amLODipine 10 MG tablet Commonly known as: NORVASC Take 1 tablet (10 mg total) by mouth daily.   aspirin 81 MG EC tablet Take 1 tablet (81 mg total) by mouth daily. Swallow whole.   atorvastatin 40 MG tablet Commonly known as: LIPITOR Take 1 tablet (40 mg total) by mouth daily.   buPROPion 150 MG 24 hr tablet Commonly known as: WELLBUTRIN XL Take 150 mg by mouth every morning.   clopidogrel 75 MG tablet Commonly known as: PLAVIX Take 1 tablet (75 mg total) by mouth daily.   losartan-hydrochlorothiazide 100-12.5 MG tablet Commonly known as: HYZAAR Take 1 tablet by mouth daily.   meloxicam 7.5 MG tablet Commonly known as: MOBIC Take 7.5 mg by mouth daily.   methocarbamol 750 MG tablet Commonly known as: ROBAXIN Take 750 mg by mouth 2 (two) times daily as needed for muscle spasms.   metoprolol succinate 25 MG 24 hr tablet Commonly known as: TOPROL-XL Take 25 mg by mouth daily.   sucralfate 1 g tablet Commonly known as: CARAFATE Take 1 g by mouth 4 (four) times daily.       No Known Allergies  Consultations:  Gastroenterology  Procedures/Studies: DG Chest 2 View  Result Date: 12/28/2020 CLINICAL DATA:  Shortness of breath.  Low hemoglobin. EXAM: CHEST - 2 VIEW COMPARISON:  04/23/2012 FINDINGS: The heart is normal in size. Normal unchanged mediastinal contours. There is mild diffuse bronchial and interstitial thickening. No focal airspace disease. No significant pleural effusion. No pneumothorax. Surgical hardware in the lower cervical spine is partially included. IMPRESSION: Mild diffuse bronchial and interstitial thickening, may be pulmonary edema or infection. Electronically Signed   By: Narda Rutherford M.D.   On: 12/28/2020 19:29    EGD and  Colonoscopy   Subjective: Patient was seen and examined at bedside.  Overnight events noted.   Patient was explained in detail about the importance of staying and resume Plavix and look for recurrent bleeding. Patient understood the risks and benefits and she left AGAINST MEDICAL ADVICE.  Discharge Exam: Vitals:   12/31/20 2312 01/01/21 0521  BP: 106/67 111/64  Pulse: 86 91  Resp: 16 15  Temp: 98.6 F (37 C) 97.9 F (36.6 C)  SpO2: 91% 100%   Vitals:   12/31/20 1823 12/31/20 1838 12/31/20 2312 01/01/21 0521  BP: (!) 167/92 (!) 150/98 106/67 111/64  Pulse: 73 83 86 91  Resp: 11 14 16 15   Temp:  98.7 F (37.1 C) 98.6 F (37 C) 97.9 F (36.6 C)  TempSrc:  Oral Oral Oral  SpO2: 100% 100% 91% 100%  Weight:      Height:        General: Pt is alert, awake, not in acute distress Cardiovascular: RRR, S1/S2 +, no rubs, no gallops Respiratory: CTA bilaterally, no wheezing, no rhonchi Abdominal: Soft, NT, ND, bowel sounds + Extremities: no edema, no cyanosis    The results of significant diagnostics from this hospitalization (including imaging, microbiology, ancillary and laboratory) are listed below for reference.     Microbiology: Recent Results (from the past 240 hour(s))  SARS Coronavirus 2 by RT PCR (hospital order,  performed in [facility] hospital lab) Nasopharyngeal Nasopharyngeal Swab     Status: None   Collection Time: 12/28/20  8:59 PM   Specimen: Nasopharyngeal Swab  Result Value Ref Range Status   SARS Coronavirus 2 NEGATIVE NEGATIVE Final    Comment: (NOTE) SARS-CoV-2 target nucleic acids are NOT DETECTED.  The SARS-CoV-2 RNA is generally detectable in upper and lower respiratory specimens during the acute phase of infection. The lowest concentration of SARS-CoV-2 viral copies this assay can detect is 250 copies / mL. A negative result does not preclude SARS-CoV-2 infection and should not be used as the sole basis for treatment or other patient management  decisions.  A negative result may occur with improper specimen collection / handling, submission of specimen other than nasopharyngeal swab, presence of viral mutation(s) within the areas targeted by this assay, and inadequate number of viral copies (<250 copies / mL). A negative result must be combined with clinical observations, patient history, and epidemiological information.  Fact Sheet for Patients:   BoilerBrush.com.cy  Fact Sheet for Healthcare Providers: https://pope.com/  This test is not yet approved or  cleared by the [place] FDA and has been authorized for detection and/or diagnosis of SARS-CoV-2 by FDA under an Emergency Use Authorization (EUA).  This EUA will remain in effect (meaning this test can be used) for the duration of the COVID-19 declaration under Section 564(b)(1) of the Act, 21 U.S.C. section 360bbb-3(b)(1), unless the authorization is terminated or revoked sooner.  Performed at [facility], [address], [place], [place] [address]      Labs: BNP (last 3 results) No results for input(s): BNP in the last 8760 hours. Basic Metabolic Panel: Recent Labs  Lab 12/28/20 1903 12/29/20 0502 12/29/20 1108  NA 139 140 143  K 4.9 4.9 4.5  CL 108 107  --   CO2 22 19*  --   GLUCOSE 95 112*  --   BUN 26* 21*  --   CREATININE 1.64* 1.37*  --   CALCIUM 8.8* 8.8*  --    Liver Function Tests: Recent Labs  Lab 12/28/20 1903  AST 19  ALT 17  ALKPHOS 55  BILITOT 0.2*  PROT 6.5  ALBUMIN 3.1*   No results for input(s): LIPASE, AMYLASE in the last 168 hours. No results for input(s): AMMONIA in the last 168 hours. CBC: Recent Labs  Lab 12/28/20 1903 12/29/20 0502 12/29/20 1108 12/30/20 0034  WBC 6.5 7.1  --  6.9  HGB 6.5* 8.2* 8.8* 8.5*  HCT 22.6* 27.1* 26.0* 26.9*  MCV 86.9 87.4  --  84.3  PLT 307 334  --  308   Cardiac Enzymes: No results for input(s): CKTOTAL, CKMB, CKMBINDEX,  TROPONINI in the last 168 hours. BNP: Invalid input(s): POCBNP CBG: Recent Labs  Lab 12/31/20 1806  GLUCAP 83   D-Dimer No results for input(s): DDIMER in the last 72 hours. Hgb A1c No results for input(s): HGBA1C in the last 72 hours. Lipid Profile No results for input(s): CHOL, HDL, LDLCALC, TRIG, CHOLHDL, LDLDIRECT in the last 72 hours. Thyroid function studies No results for input(s): TSH, T4TOTAL, T3FREE, THYROIDAB in the last 72 hours.  Invalid input(s): FREET3 Anemia work up No results for input(s): VITAMINB12, FOLATE, FERRITIN, TIBC, IRON, RETICCTPCT in the last 72 hours. Urinalysis    Component Value Date/Time   COLORURINE YELLOW 11/06/2009 1109   APPEARANCEUR CLEAR 11/06/2009 1109   LABSPEC 1.025 11/13/2020 1348   PHURINE 5.5 11/13/2020 1348   GLUCOSEU  NEGATIVE 11/13/2020 1348   HGBUR TRACE (A) 11/13/2020 1348   BILIRUBINUR NEGATIVE 11/13/2020 1348   KETONESUR NEGATIVE 11/13/2020 1348   PROTEINUR NEGATIVE 11/13/2020 1348   UROBILINOGEN 0.2 11/13/2020 1348   NITRITE NEGATIVE 11/13/2020 1348   LEUKOCYTESUR NEGATIVE 11/13/2020 1348   Sepsis Labs Invalid input(s): PROCALCITONIN,  WBC,  LACTICIDVEN Microbiology Recent Results (from the past 240 hour(s))  SARS Coronavirus 2 by RT PCR (hospital order, performed in [facility] hospital lab) Nasopharyngeal Nasopharyngeal Swab     Status: None   Collection Time: 12/28/20  8:59 PM   Specimen: Nasopharyngeal Swab  Result Value Ref Range Status   SARS Coronavirus 2 NEGATIVE NEGATIVE Final    Comment: (NOTE) SARS-CoV-2 target nucleic acids are NOT DETECTED.  The SARS-CoV-2 RNA is generally detectable in upper and lower respiratory specimens during the acute phase of infection. The lowest concentration of SARS-CoV-2 viral copies this assay can detect is 250 copies / mL. A negative result does not preclude SARS-CoV-2 infection and should not be used as the sole basis for treatment or other patient management decisions.   A negative result may occur with improper specimen collection / handling, submission of specimen other than nasopharyngeal swab, presence of viral mutation(s) within the areas targeted by this assay, and inadequate number of viral copies (<250 copies / mL). A negative result must be combined with clinical observations, patient history, and epidemiological information.  Fact Sheet for Patients:   BoilerBrush.com.cy  Fact Sheet for Healthcare Providers: https://pope.com/  This test is not yet approved or  cleared by the [place] FDA and has been authorized for detection and/or diagnosis of SARS-CoV-2 by FDA under an Emergency Use Authorization (EUA).  This EUA will remain in effect (meaning this test can be used) for the duration of the COVID-19 declaration under Section 564(b)(1) of the Act, 21 U.S.C. section 360bbb-3(b)(1), unless the authorization is terminated or revoked sooner.  Performed at [facility], [address], [place], [place] [address]      Time coordinating discharge: Over 30 minutes  SIGNED:   Cipriano Bunker, MD  [facility] 01/01/2021, 4:38 PM Pager   If 7PM-7AM, please contact night-coverage [email or web address]

## 2021-01-01 NOTE — Progress Notes (Signed)
After an extensive conversation with MD and RN. Patient still request to leave AMA. She verbalizes that she understood the risk of leaving AMA and will follow up with her PCP at [facility].

## 2021-01-01 NOTE — Progress Notes (Signed)
PROGRESS NOTE    Deborah Mcmahon  SEG:[id number] DOB: 1961/08/02 DOA: 12/28/2020 PCP: Loura Back, NP   Brief Narrative: This 60 y.o. female with a history of hypertension, cocaine abuse, CKD stage IIIa, CVA. Patient presented secondary to hemoglobin of 8 at her PCPs office in setting of worsening fatigue and dyspnea on exertion. She was found to have acute blood loss anemia secondary to presumed upper GI bleed.  Apparently, patient was on aspirin, meloxicam prior to admission.  Aspirin and Plavix were started after patient had a stroke in December of last year.  GI team was consulted to assist with patient's management.  Patient underwent EGD and colonoscopy today.  Nonbleeding prepyloric ulcer, gastritis, single colonic polyp and diverticulosis were noted.  Patient will be given 1 dose of IV ferumoxytol 510 Mg.   As per GI team, patient may resume only Plavix in 24 to 48 hours.  Also discussed with neurology team, Dr. Roda Shutters, Dr. Roda Shutters has deferred antiplatelet management to the GI team.  Assessment & Plan:   Principal Problem:   Acute blood loss anemia Active Problems:   Essential hypertension   CVA (cerebral vascular accident) (HCC)   CKD (chronic kidney disease), stage III (HCC)   Melena   Melena :: Resolved. Hemoccult negative. Associated anemia.  Patient was on aspirin and Plavix and takes Mobic as an outpatient. GI consulted on admission. EGD and colonoscopy revealed nonbleeding prepyloric gastric ulcer, gastritis, single colonic polyp and diverticulosis. Patient has recieved 1 dose of ferumoxytol. According to GI recommendation, patient may restart Plavix in 24 to 48 hours.  High-dose PPI and Carafate. -Likely discharge tomorrow if patient continues to do well.  Acute blood loss anemia Baseline hemoglobin of 13-14. Secondary to presume GI bleeding (presumed upper).  Patient presented with a hemoglobin of 6.5. She has received 1 unit of PRBC to date. Hemoglobin of 8.2. -Daily CBC.   Hb remained stable.  AKI on CKD stage IIIa Baseline creatinine of about 1.1-1.2. Creatinine of 1.64 on admission and has improved. 12/30/2020: Renal function is improving.  Continue to monitor.  Primary hypertension Patient is on amlodipine and Hyzaar as an outpatient.  -Continue amlodipine and resume home Hyzaar 12/30/2020: Continue to monitor closely.  Goal blood pressure should be less than 130/80 mmHg.  Lethargy Resolved.    Hyperlipidemia -Continue Lipitor  History of stroke -Patient has stroke late last year. -Patient was on aspirin and Plavix following the stroke. -GI has recommended starting Plavix only in 24 to 48 hours.    Gout -Continue allopurinol  Substance use Patient is cocaine positive   DVT prophylaxis: SCDs Code Status:  Full code. Family Communication:  No family at bed side. Disposition Plan:   Consultants:    Gastroneterology  Procedures: Antimicrobials:   Anti-infectives (From admission, onward)   None      Subjective: Patient was seen and examined at bedside.  Overnight events noted.   She was sitting comfortably on the chair,  states she wants to be discharged.   Explained to the patient in detail that we have to resume her Plavix and need to monitor for 24 hours to look for further bleeding.   Patient signed AMA.  Objective: Vitals:   12/31/20 1823 12/31/20 1838 12/31/20 2312 01/01/21 0521  BP: (!) 167/92 (!) 150/98 106/67 111/64  Pulse: 73 83 86 91  Resp: 11 14 16 15   Temp:  98.7 F (37.1 C) 98.6 F (37 C) 97.9 F (36.6 C)  TempSrc:  Oral Oral  Oral  SpO2: 100% 100% 91% 100%  Weight:      Height:        Intake/Output Summary (Last 24 hours) at 01/01/2021 1627 Last data filed at 01/01/2021 0911 Gross per 24 hour  Intake 840 ml  Output --  Net 840 ml   Filed Weights   12/29/20 1508 12/31/20 1431  Weight: 93.4 kg 93.4 kg    Examination:  General exam: Appears calm and comfortable , not in any distress. Respiratory  system: Clear to auscultation. Respiratory effort normal. Cardiovascular system: S1 & S2 heard, RRR. No JVD, murmurs, rubs, gallops or clicks. No pedal edema. Gastrointestinal system: Abdomen is nondistended, soft and nontender. No organomegaly or masses felt. Normal bowel sounds heard. Central nervous system: Alert and oriented. No focal neurological deficits. Extremities: Symmetric 5 x 5 power. Skin: No rashes, lesions or ulcers Psychiatry: Judgement and insight appear normal. Mood & affect appropriate.     Data Reviewed: I have personally reviewed following labs and imaging studies  CBC: Recent Labs  Lab 12/28/20 1903 12/29/20 0502 12/29/20 1108 12/30/20 0034  WBC 6.5 7.1  --  6.9  HGB 6.5* 8.2* 8.8* 8.5*  HCT 22.6* 27.1* 26.0* 26.9*  MCV 86.9 87.4  --  84.3  PLT 307 334  --  308   Basic Metabolic Panel: Recent Labs  Lab 12/28/20 1903 12/29/20 0502 12/29/20 1108  NA 139 140 143  K 4.9 4.9 4.5  CL 108 107  --   CO2 22 19*  --   GLUCOSE 95 112*  --   BUN 26* 21*  --   CREATININE 1.64* 1.37*  --   CALCIUM 8.8* 8.8*  --    GFR: Estimated Creatinine Clearance: 48.4 mL/min (A) (by C-G formula based on SCr of 1.37 mg/dL (H)). Liver Function Tests: Recent Labs  Lab 12/28/20 1903  AST 19  ALT 17  ALKPHOS 55  BILITOT 0.2*  PROT 6.5  ALBUMIN 3.1*   No results for input(s): LIPASE, AMYLASE in the last 168 hours. No results for input(s): AMMONIA in the last 168 hours. Coagulation Profile: Recent Labs  Lab 12/28/20 2105  INR 1.1   Cardiac Enzymes: No results for input(s): CKTOTAL, CKMB, CKMBINDEX, TROPONINI in the last 168 hours. BNP (last 3 results) No results for input(s): PROBNP in the last 8760 hours. HbA1C: No results for input(s): HGBA1C in the last 72 hours. CBG: Recent Labs  Lab 12/31/20 1806  GLUCAP 83   Lipid Profile: No results for input(s): CHOL, HDL, LDLCALC, TRIG, CHOLHDL, LDLDIRECT in the last 72 hours. Thyroid Function Tests: No results  for input(s): TSH, T4TOTAL, FREET4, T3FREE, THYROIDAB in the last 72 hours. Anemia Panel: No results for input(s): VITAMINB12, FOLATE, FERRITIN, TIBC, IRON, RETICCTPCT in the last 72 hours. Sepsis Labs: No results for input(s): PROCALCITON, LATICACIDVEN in the last 168 hours.  Recent Results (from the past 240 hour(s))  SARS Coronavirus 2 by RT PCR (hospital order, performed in [facility] hospital lab) Nasopharyngeal Nasopharyngeal Swab     Status: None   Collection Time: 12/28/20  8:59 PM   Specimen: Nasopharyngeal Swab  Result Value Ref Range Status   SARS Coronavirus 2 NEGATIVE NEGATIVE Final    Comment: (NOTE) SARS-CoV-2 target nucleic acids are NOT DETECTED.  The SARS-CoV-2 RNA is generally detectable in upper and lower respiratory specimens during the acute phase of infection. The lowest concentration of SARS-CoV-2 viral copies this assay can detect is 250 copies / mL. A negative result does not  preclude SARS-CoV-2 infection and should not be used as the sole basis for treatment or other patient management decisions.  A negative result may occur with improper specimen collection / handling, submission of specimen other than nasopharyngeal swab, presence of viral mutation(s) within the areas targeted by this assay, and inadequate number of viral copies (<250 copies / mL). A negative result must be combined with clinical observations, patient history, and epidemiological information.  Fact Sheet for Patients:   BoilerBrush.com.cy  Fact Sheet for Healthcare Providers: https://pope.com/  This test is not yet approved or  cleared by the [place] FDA and has been authorized for detection and/or diagnosis of SARS-CoV-2 by FDA under an Emergency Use Authorization (EUA).  This EUA will remain in effect (meaning this test can be used) for the duration of the COVID-19 declaration under Section 564(b)(1) of the Act, 21  U.S.C. section 360bbb-3(b)(1), unless the authorization is terminated or revoked sooner.  Performed at [facility], [address], [place], [place] [address]     Radiology Studies: No results found.  Scheduled Meds: Continuous Infusions:   LOS: 4 days    Time spent: 35 mins    Jimi Schappert, MD [facility]   If 7PM-7AM, please contact night-coverage

## 2021-01-02 LAB — SURGICAL PATHOLOGY

## 2021-01-03 ENCOUNTER — Encounter: Payer: Self-pay | Admitting: Gastroenterology

## 2021-01-09 ENCOUNTER — Other Ambulatory Visit: Payer: Self-pay | Admitting: Student

## 2021-01-09 DIAGNOSIS — Z87891 Personal history of nicotine dependence: Secondary | ICD-10-CM

## 2021-01-11 ENCOUNTER — Encounter: Payer: Self-pay | Admitting: Adult Health

## 2021-01-11 ENCOUNTER — Ambulatory Visit (INDEPENDENT_AMBULATORY_CARE_PROVIDER_SITE_OTHER): Payer: Medicare Other | Admitting: Adult Health

## 2021-01-11 VITALS — BP 120/64 | HR 80 | Ht 64.0 in | Wt 213.0 lb

## 2021-01-11 DIAGNOSIS — I639 Cerebral infarction, unspecified: Secondary | ICD-10-CM

## 2021-01-11 NOTE — Progress Notes (Signed)
[facility] [address] [place] [id number] [phone]       HOSPITAL FOLLOW UP NOTE  Ms. Rickey PrimusBeverly R Kroner Date of Birth:  [birth year] Medical Record Number:  [id number]   Reason for Referral:  hospital stroke follow up    SUBJECTIVE:   CHIEF COMPLAINT:  Chief Complaint  Patient presents with  . Follow-up    RM 14 Alone Pt is well, hands has some numbness   . Trauma    HPI:   Ms. Rickey PrimusBeverly R Whisonant is a 60 y.o. female with history of essential hypertension, tobacco abuse, and obesity who [facility]14/2020 with 6d hx headache and dizziness and 1 episode of slurred speech.  Personally reviewed hospitalization pertinent progress notes, lab work and imaging with summary provided.  Evaluated by Dr. 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  Recommended DAPT for 3 months then aspirin alone.  History of HTN with hypertensive emergency upon arrival stabilize during admission with long-term BP goal normotensive range.  LDL 119 and initiate atorvastatin 40 mg daily.  No history or evidence of DM with A1c 5.5.  Other stroke risk factors include tobacco use, substance abuse (UDS + cocaine) and obesity.  Evaluated by therapies [facility] and discharged home in stable condition.  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 Patchy periventricular small vessel disease. Posterior R centrum semiovale hypoattenuation. Trace calcification  MRI w/w/o acute inferior L PCA infarct w/ edema and petechial hemorrhage. Small R lateral cerebellar infarct. Mass effect 4th ventricle. Moderate small vessel disease throughout  MRA head Unremarkable   MRA neck L VA origin occlusion. No flow seen L PICA  Repeat CT head evolving L cerebellar infarct w/ stable mass effect  2D Echo EF 60-65%. No  source of embolus   LDL 119  HgbA1c 5.5  UDS: +cocaine  VTE prophylaxis - Lovenox 40 mg sq daily added  No antithrombotic prior to admission, now on aspirin 81 mg daily. Add plavix. Continue DAPT  X 3 MONTHS the aspirin alone   Therapy recommendations:  HH PT, HH OT  Disposition:   Home   Today, 01/11/2021, Ms. Manson PasseyBrown is being seen for hospital follow-up unaccompanied.   Reports complete recovery from her stroke - returned back to doing all prior activites Denies new stroke/TIA symptoms  Recently admitted on 12/28/2020 for severe anemia with extensive GI work-up.  She was cleared to resume Plavix but aspirin remained on hold. She reports being told to stop plavix and aspirin yesterday by PCP due to recent admission - she does have f/u visit scheduled with GI 2/27.  She denies any additional melena or any other bleeding concerns Remains on atorvastatin 40 mg daily without myalgias Blood pressure today initially 166/90 and on recheck 120/64- monitors at home and has been fluctuating per patient report - reports recently discontinuing Hyzaar per PCP recommendations  Continued tobacco use smoking approximately 3 cigarettes/day with goal of complete tobacco cessation Denies any residual drug use (although UDS 2/4 + cocaine)  Greatest concern today is in regards to increased fatigue and dyspnea on exertion -reports being referred to pulmonology by PCP and currently awaiting to schedule initial evaluation     ROS:   14 system review of systems performed and negative with exception of those listed in HPI  PMH:  Past Medical History:  Diagnosis Date  . Carpal tunnel syndrome    bilateral  . Chronic back pain    spondylolisthesis  . Constipation    takes an OTC stool softener daily as needed  . Gallstones   . History of migraine   . Hypertension   . Nocturia   . Weakness    tingling     PSH:  Past Surgical History:  Procedure Laterality Date  . BACK SURGERY     fusion  .  BIOPSY  12/31/2020   Procedure: BIOPSY;  Surgeon: Meridee Score Netty Starring., MD;  Location: [facility] ENDOSCOPY;  Service: Gastroenterology;;  . CERVICAL SPINE SURGERY     x 4-fusion  . COLONOSCOPY WITH PROPOFOL N/A 12/31/2020   Procedure: COLONOSCOPY WITH PROPOFOL;  Surgeon: Meridee Score Netty Starring., MD;  Location: [facility] ENDOSCOPY;  Service: Gastroenterology;  Laterality: N/A;  . ESOPHAGOGASTRODUODENOSCOPY (EGD) WITH PROPOFOL N/A 12/31/2020   Procedure: ESOPHAGOGASTRODUODENOSCOPY (EGD) WITH PROPOFOL;  Surgeon: Meridee Score Netty Starring., MD;  Location: [facility] ENDOSCOPY;  Service: Gastroenterology;  Laterality: N/A;  . MYRINGOTOMY    . POLYPECTOMY  12/31/2020   Procedure: POLYPECTOMY;  Surgeon: Mansouraty, Netty Starring., MD;  Location: [facility] ENDOSCOPY;  Service: Gastroenterology;;  . right knee surgery     screws  . TONSILLECTOMY AND ADENOIDECTOMY    . TUBAL LIGATION      Social History:  Social History   Socioeconomic History  . Marital status: Single    Spouse name: Not on file  . Number of children: Not on file  . Years of education: Not on file  . Highest education level: Not on file  Occupational History  . Not on file  Tobacco Use  . Smoking status: Current Every Day Smoker    Packs/day: 0.50    Years: 41.00    Pack years: 20.50  . Smokeless tobacco: Never Used  Substance and Sexual Activity  . Alcohol use: No  . Drug use: No  . Sexual activity: Yes    Birth control/protection: Post-menopausal  Other Topics Concern  . Not on file  Social History Narrative  . Not on file   Social Determinants of Health   Financial Resource Strain: Not on file  Food Insecurity: Not on file  Transportation Needs: Not on file  Physical Activity: Not on file  Stress: Not on file  Social Connections: Not on file  Intimate Partner Violence: Not on file    Family History:  Family History  Problem Relation Age of Onset  . Anesthesia problems Neg Hx     Medications:   Current Outpatient Medications on File  Prior to Visit  Medication Sig Dispense Refill  . acetaminophen (TYLENOL) 500 MG tablet Take 500 mg by mouth every 6 (six) hours as needed for moderate pain or headache.    . albuterol (VENTOLIN HFA) 108 (90 Base) MCG/ACT inhaler Inhale 2 puffs into the lungs every 6 (six) hours as needed for wheezing or shortness of breath.    . allopurinol (ZYLOPRIM) 100 MG tablet Take 100 mg by mouth daily.    Marland Kitchen amLODipine (NORVASC) 10 MG tablet Take 1 tablet (10 mg total) by mouth daily. 30 tablet 3  . atorvastatin (LIPITOR) 40 MG tablet Take 1 tablet (40 mg total) by mouth daily. 90 tablet 3  . buPROPion (WELLBUTRIN XL) 150 MG 24 hr tablet Take 150 mg by mouth every morning.    . clopidogrel (PLAVIX) 75 MG tablet Take 1 tablet (75 mg total) by mouth daily. 90  tablet 0  . methocarbamol (ROBAXIN) 750 MG tablet Take 750 mg by mouth 2 (two) times daily as needed for muscle spasms.    . metoprolol succinate (TOPROL-XL) 25 MG 24 hr tablet Take 25 mg by mouth daily.    . sucralfate (CARAFATE) 1 g tablet Take 1 g by mouth 4 (four) times daily.     No current facility-administered medications on file prior to visit.    Allergies:  No Known Allergies    OBJECTIVE:  Physical Exam  Vitals:   01/11/21 0902 01/11/21 0927  BP: (!) 166/90 120/64  Pulse: 80   Weight: 213 lb (96.6 kg)   Height: 5\' 4"  (1.626 m)    Body mass index is 36.56 kg/m. No exam data present  General: well developed, well nourished,  pleasant middle-aged African-American female, seated, in no evident distress Head: head normocephalic and atraumatic.   Neck: supple with no carotid or supraclavicular bruits Cardiovascular: regular rate and rhythm, no murmurs Musculoskeletal: no deformity Skin:  no rash/petichiae Vascular:  Normal pulses all extremities   Neurologic Exam Mental Status: Awake and fully alert.   Fluent speech and language.  Oriented to place and time. Recent and remote memory intact. Attention span, concentration and  fund of knowledge appropriate. Mood and affect appropriate.  Cranial Nerves: Fundoscopic exam reveals sharp disc margins. Pupils equal, briskly reactive to light. Extraocular movements full without nystagmus. Visual fields full to confrontation. Hearing intact. Facial sensation intact. Face, tongue, palate moves normally and symmetrically.  Motor: Normal bulk and tone. Normal strength in all tested extremity muscles Sensory.: intact to touch , pinprick , position and vibratory sensation.  Coordination: Rapid alternating movements normal in all extremities. Finger-to-nose and heel-to-shin performed accurately bilaterally. Gait and Station: Arises from chair without difficulty. Stance is normal. Gait demonstrates normal stride length and balance without use of assistive device. Reflexes: 1+ and symmetric. Toes downgoing.     NIHSS  0 Modified Rankin  0      ASSESSMENT: RANETTA ARMACOST is a 60 y.o. 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  Vascular risk factors include L VA occlusion, HTN, HLD, tobacco use, substance abuse and obesity.      PLAN:  1. B/l cerebellar strokes:  a. Recovered well without residual deficits.   b. Recommend restarting clopidogrel 75 mg daily as this was cleared to restart per GI at recent discharge and has had no additional bleeding concerns.  c. Continue atorvastatin 40 mg daily for secondary stroke prevention.  d. Discussed secondary stroke prevention measures and importance of close PCP follow up for aggressive stroke risk factor management  2. HTN: BP goal <130/90.  Initially elevated but on recheck stable on amlodipine and metoprolol per PCP 3. HLD: LDL goal <70. Recent LDL 119 therefore atorvastatin 40 mg daily initiated during recent admission.  Recommend continuation and request  follow-up with PCP in the next 1 to 2 months for repeat lipid panel and ongoing prescribing of atorvastatin 4. Substance abuse: UDS + cocaine 12/22 and 2/4 -she denies current continued use.  Educated on importance of complete abstinence which she verbalized understanding 5. Tobacco abuse: approx 3 cigarettes/day with goals of complete cessation which was encouraged 6. DOE and fatigue: Awaiting pulmonology evaluation 7. GI bleed: f/u with GI 2/27    Follow up in 4 months or  call earlier if needed   CC:  [facility] provider: Dr. Gray Bernhardt, Selena Batten, NP    I spent 45 minutes of face-to-face and non-face-to-face time with patient.  This included previsit chart review including recent hospitalization pertinent progress notes, lab work and imaging, lab review, study review, order entry, electronic health record documentation, patient education regarding recent stroke and etiology, recent GI bleed admission, DOE and fatigue complaints, importance of managing stroke risk factors and answered all other questions to patients satisfaction   Ihor Austin, AGNP-BC  [facility] [address] [place], [place] [address]  Phone [phone] Fax [phone] Note: This document was prepared with digital dictation and possible smart phrase technology. Any transcriptional errors that result from this process are unintentional.

## 2021-01-11 NOTE — Patient Instructions (Signed)
Ensure follow up with GI as scheduled on 2/27 for recent GI bleed  Ensure scheduled f/u with pulmonology due to shortness of breath and fatigue  restart clopidogrel 75 mg daily  and continue atorvastatin  for secondary stroke prevention  Continue to follow up with PCP regarding cholesterol and blood pressure management  Maintain strict control of hypertension with blood pressure goal below 130/90 and cholesterol with LDL cholesterol (bad cholesterol) goal below 70 mg/dL.      Followup in the future with me in 4 months or call earlier if needed     Thank you for coming to see [name] at [facility]. I hope we have been able to provide you high quality care today.  You may receive a patient satisfaction survey over the next few weeks. We would appreciate your feedback and comments so that we may continue to improve ourselves and the health of our patients.

## 2021-01-11 NOTE — Progress Notes (Signed)
I agree with the above plan 

## 2021-02-16 ENCOUNTER — Ambulatory Visit: Payer: Medicare Other | Admitting: Gastroenterology

## 2021-03-26 ENCOUNTER — Encounter: Payer: Self-pay | Admitting: Pulmonary Disease

## 2021-03-27 ENCOUNTER — Telehealth: Payer: Self-pay

## 2021-03-27 NOTE — Telephone Encounter (Signed)
NOTES ON FILE FROM [facility] [phone] SENT REFERRAL TO SCHEDULING 

## 2021-04-17 ENCOUNTER — Institutional Professional Consult (permissible substitution): Payer: Medicare Other | Admitting: Pulmonary Disease

## 2021-05-16 ENCOUNTER — Encounter: Payer: Self-pay | Admitting: Adult Health

## 2021-05-16 ENCOUNTER — Ambulatory Visit: Payer: Medicare Other | Admitting: Adult Health

## 2021-05-25 ENCOUNTER — Ambulatory Visit: Payer: Medicare Other | Admitting: Cardiovascular Disease

## 2021-07-18 ENCOUNTER — Institutional Professional Consult (permissible substitution): Payer: Medicare Other | Admitting: Pulmonary Disease

## 2021-08-16 ENCOUNTER — Institutional Professional Consult (permissible substitution): Payer: Medicare Other | Admitting: Pulmonary Disease

## 2021-08-16 NOTE — Progress Notes (Deleted)
Synopsis: Referred in September 2022 for Shortness of breath by Loura Back  Subjective:   PATIENT ID: Deborah Mcmahon GENDER: female DOB: 1961-08-01, MRN: [id number]   HPI  No chief complaint on file.   Deborah Mcmahon is a 60 year old woman, daily smoker with history of hypertension who is referred to pulmonary clinic for shortness of breath.     Past Medical History:  Diagnosis Date   Carpal tunnel syndrome    bilateral   Chronic back pain    spondylolisthesis   Constipation    takes an OTC stool softener daily as needed   Gallstones    History of migraine    Hypertension    Nocturia    Weakness    tingling      Family History  Problem Relation Age of Onset   Anesthesia problems Neg Hx      Social History   Socioeconomic History   Marital status: Single    Spouse name: Not on file   Number of children: Not on file   Years of education: Not on file   Highest education level: Not on file  Occupational History   Not on file  Tobacco Use   Smoking status: Every Day    Packs/day: 0.50    Years: 41.00    Pack years: 20.50    Types: Cigarettes   Smokeless tobacco: Never  Substance and Sexual Activity   Alcohol use: No   Drug use: No   Sexual activity: Yes    Birth control/protection: Post-menopausal  Other Topics Concern   Not on file  Social History Narrative   Not on file   Social Determinants of Health   Financial Resource Strain: Not on file  Food Insecurity: Not on file  Transportation Needs: Not on file  Physical Activity: Not on file  Stress: Not on file  Social Connections: Not on file  Intimate Partner Violence: Not on file     No Known Allergies   Outpatient Medications Prior to Visit  Medication Sig Dispense Refill   acetaminophen (TYLENOL) 500 MG tablet Take 500 mg by mouth every 6 (six) hours as needed for moderate pain or headache.     albuterol (VENTOLIN HFA) 108 (90 Base) MCG/ACT inhaler Inhale 2 puffs into the lungs every 6  (six) hours as needed for wheezing or shortness of breath.     allopurinol (ZYLOPRIM) 100 MG tablet Take 100 mg by mouth daily.     amLODipine (NORVASC) 10 MG tablet Take 1 tablet (10 mg total) by mouth daily. 30 tablet 3   atorvastatin (LIPITOR) 40 MG tablet Take 1 tablet (40 mg total) by mouth daily. 90 tablet 3   buPROPion (WELLBUTRIN XL) 150 MG 24 hr tablet Take 150 mg by mouth every morning.     methocarbamol (ROBAXIN) 750 MG tablet Take 750 mg by mouth 2 (two) times daily as needed for muscle spasms.     metoprolol succinate (TOPROL-XL) 25 MG 24 hr tablet Take 25 mg by mouth daily.     sucralfate (CARAFATE) 1 g tablet Take 1 g by mouth 4 (four) times daily.     No facility-administered medications prior to visit.    Review of Systems  Constitutional:  Negative for chills, fever, malaise/fatigue and weight loss.  HENT:  Negative for congestion, sinus pain and sore throat.   Eyes: Negative.   Respiratory:  Negative for cough, hemoptysis, sputum production, shortness of breath and wheezing.   Cardiovascular:  Negative  for chest pain, palpitations, orthopnea, claudication and leg swelling.  Gastrointestinal:  Negative for abdominal pain, heartburn, nausea and vomiting.  Genitourinary: Negative.   Musculoskeletal:  Negative for joint pain and myalgias.  Skin:  Negative for rash.  Neurological:  Negative for weakness.  Endo/Heme/Allergies: Negative.   Psychiatric/Behavioral: Negative.       Objective:  There were no vitals filed for this visit.   Physical Exam Constitutional:      General: She is not in acute distress.    Appearance: She is not ill-appearing.  HENT:     Head: Normocephalic and atraumatic.  Eyes:     General: No scleral icterus.    Conjunctiva/sclera: Conjunctivae normal.     Pupils: Pupils are equal, round, and reactive to light.  Cardiovascular:     Rate and Rhythm: Normal rate and regular rhythm.     Pulses: Normal pulses.     Heart sounds: Normal heart  sounds. No murmur heard. Pulmonary:     Effort: Pulmonary effort is normal.     Breath sounds: Normal breath sounds. No wheezing, rhonchi or rales.  Abdominal:     General: Bowel sounds are normal.     Palpations: Abdomen is soft.  Musculoskeletal:     Right lower leg: No edema.     Left lower leg: No edema.  Lymphadenopathy:     Cervical: No cervical adenopathy.  Skin:    General: Skin is warm and dry.  Neurological:     General: No focal deficit present.     Mental Status: She is alert.  Psychiatric:        Mood and Affect: Mood normal.        Behavior: Behavior normal.        Thought Content: Thought content normal.        Judgment: Judgment normal.      CBC    Component Value Date/Time   WBC 6.9 12/30/2020 0034   RBC 3.19 (L) 12/30/2020 0034   HGB 8.5 (L) 12/30/2020 0034   HCT 26.9 (L) 12/30/2020 0034   PLT 308 12/30/2020 0034   MCV 84.3 12/30/2020 0034   MCH 26.6 12/30/2020 0034   MCHC 31.6 12/30/2020 0034   RDW 16.3 (H) 12/30/2020 0034   LYMPHSABS 3.0 11/17/2020 0237   MONOABS 0.9 11/17/2020 0237   EOSABS 0.1 11/17/2020 0237   BASOSABS 0.0 11/17/2020 0237     Chest imaging: CXR 12/28/20 The heart is normal in size. Normal unchanged mediastinal contours. There is mild diffuse bronchial and interstitial thickening. No focal airspace disease. No significant pleural effusion. No pneumothorax. Surgical hardware in the lower cervical spine is partially included.  PFT: No flowsheet data found.  Echo 11/20/20: LV EF 70-75%. Severe LVH. Grade I diastolic dysfunction. RV systolic function is normal. RV size is small.   Heart Catheterization:       Assessment & Plan:   Shortness of breath  Discussion: ***    Current Outpatient Medications:    acetaminophen (TYLENOL) 500 MG tablet, Take 500 mg by mouth every 6 (six) hours as needed for moderate pain or headache., Disp: , Rfl:    albuterol (VENTOLIN HFA) 108 (90 Base) MCG/ACT inhaler, Inhale 2 puffs  into the lungs every 6 (six) hours as needed for wheezing or shortness of breath., Disp: , Rfl:    allopurinol (ZYLOPRIM) 100 MG tablet, Take 100 mg by mouth daily., Disp: , Rfl:    amLODipine (NORVASC) 10 MG tablet, Take 1 tablet (10 mg  total) by mouth daily., Disp: 30 tablet, Rfl: 3   atorvastatin (LIPITOR) 40 MG tablet, Take 1 tablet (40 mg total) by mouth daily., Disp: 90 tablet, Rfl: 3   buPROPion (WELLBUTRIN XL) 150 MG 24 hr tablet, Take 150 mg by mouth every morning., Disp: , Rfl:    methocarbamol (ROBAXIN) 750 MG tablet, Take 750 mg by mouth 2 (two) times daily as needed for muscle spasms., Disp: , Rfl:    metoprolol succinate (TOPROL-XL) 25 MG 24 hr tablet, Take 25 mg by mouth daily., Disp: , Rfl:    sucralfate (CARAFATE) 1 g tablet, Take 1 g by mouth 4 (four) times daily., Disp: , Rfl:

## 2021-09-10 ENCOUNTER — Encounter: Payer: Self-pay | Admitting: Adult Health

## 2021-09-10 ENCOUNTER — Ambulatory Visit: Payer: Medicare Other | Admitting: Adult Health

## 2021-09-20 ENCOUNTER — Ambulatory Visit: Payer: Medicare Other | Admitting: Adult Health

## 2021-09-20 ENCOUNTER — Encounter: Payer: Self-pay | Admitting: Adult Health

## 2021-12-27 ENCOUNTER — Institutional Professional Consult (permissible substitution): Payer: Medicare Other | Admitting: Pulmonary Disease

## 2021-12-27 NOTE — Progress Notes (Deleted)
Synopsis: Referred in February 2023 for shortness of breath by Arthur Holms, NP  Subjective:   PATIENT ID: [place]: female DOB: [birth year], MRN: [id number]   HPI  No chief complaint on file.  Deborah Mcmahon is a 61 year old woman, daily smoker with hypertension who is referred to pulmonary clinic for shortness of breath.     Past Medical History:  Diagnosis Date   Carpal tunnel syndrome    bilateral   Chronic back pain    spondylolisthesis   Constipation    takes an OTC stool softener daily as needed   Gallstones    History of migraine    Hypertension    Nocturia    Weakness    tingling      Family History  Problem Relation Age of Onset   Anesthesia problems Neg Hx      Social History   Socioeconomic History   Marital status: Single    Spouse name: Not on file   Number of children: Not on file   Years of education: Not on file   Highest education level: Not on file  Occupational History   Not on file  Tobacco Use   Smoking status: Every Day    Packs/day: 0.50    Years: 41.00    Pack years: 20.50    Types: Cigarettes   Smokeless tobacco: Never  Substance and Sexual Activity   Alcohol use: No   Drug use: No   Sexual activity: Yes    Birth control/protection: Post-menopausal  Other Topics Concern   Not on file  Social History Narrative   Not on file   Social Determinants of Health   Financial Resource Strain: Not on file  Food Insecurity: Not on file  Transportation Needs: Not on file  Physical Activity: Not on file  Stress: Not on file  Social Connections: Not on file  Intimate Partner Violence: Not on file     No Known Allergies   Outpatient Medications Prior to Visit  Medication Sig Dispense Refill   acetaminophen (TYLENOL) 500 MG tablet Take 500 mg by mouth every 6 (six) hours as needed for moderate pain or headache.     albuterol (VENTOLIN HFA) 108 (90 Base) MCG/ACT inhaler Inhale 2 puffs into the lungs every 6 (six) hours  as needed for wheezing or shortness of breath.     allopurinol (ZYLOPRIM) 100 MG tablet Take 100 mg by mouth daily.     amLODipine (NORVASC) 10 MG tablet Take 1 tablet (10 mg total) by mouth daily. 30 tablet 3   atorvastatin (LIPITOR) 40 MG tablet Take 1 tablet (40 mg total) by mouth daily. 90 tablet 3   buPROPion (WELLBUTRIN XL) 150 MG 24 hr tablet Take 150 mg by mouth every morning.     methocarbamol (ROBAXIN) 750 MG tablet Take 750 mg by mouth 2 (two) times daily as needed for muscle spasms.     metoprolol succinate (TOPROL-XL) 25 MG 24 hr tablet Take 25 mg by mouth daily.     sucralfate (CARAFATE) 1 g tablet Take 1 g by mouth 4 (four) times daily.     No facility-administered medications prior to visit.   Review of Systems  Constitutional:  Negative for chills, fever, malaise/fatigue and weight loss.  HENT:  Negative for congestion, sinus pain and sore throat.   Eyes: Negative.   Respiratory:  Negative for cough, hemoptysis, sputum production, shortness of breath and wheezing.   Cardiovascular:  Negative for chest pain,  palpitations, orthopnea, claudication and leg swelling.  Gastrointestinal:  Negative for abdominal pain, heartburn, nausea and vomiting.  Genitourinary: Negative.   Musculoskeletal:  Negative for joint pain and myalgias.  Skin:  Negative for rash.  Neurological:  Negative for weakness.  Endo/Heme/Allergies: Negative.   Psychiatric/Behavioral: Negative.     Objective:  There were no vitals filed for this visit.  Physical Exam Constitutional:      General: She is not in acute distress.    Appearance: She is not ill-appearing.  HENT:     Head: Normocephalic and atraumatic.  Eyes:     General: No scleral icterus.    Conjunctiva/sclera: Conjunctivae normal.     Pupils: Pupils are equal, round, and reactive to light.  Cardiovascular:     Rate and Rhythm: Normal rate and regular rhythm.     Pulses: Normal pulses.     Heart sounds: Normal heart sounds. No murmur  heard. Pulmonary:     Effort: Pulmonary effort is normal.     Breath sounds: Normal breath sounds. No wheezing, rhonchi or rales.  Abdominal:     General: Bowel sounds are normal.     Palpations: Abdomen is soft.  Musculoskeletal:     Right lower leg: No edema.     Left lower leg: No edema.  Lymphadenopathy:     Cervical: No cervical adenopathy.  Skin:    General: Skin is warm and dry.  Neurological:     General: No focal deficit present.     Mental Status: She is alert.  Psychiatric:        Mood and Affect: Mood normal.        Behavior: Behavior normal.        Thought Content: Thought content normal.        Judgment: Judgment normal.   CBC    Component Value Date/Time   WBC 6.9 12/30/2020 0034   RBC 3.19 (L) 12/30/2020 0034   HGB 8.5 (L) 12/30/2020 0034   HCT 26.9 (L) 12/30/2020 0034   PLT 308 12/30/2020 0034   MCV 84.3 12/30/2020 0034   MCH 26.6 12/30/2020 0034   MCHC 31.6 12/30/2020 0034   RDW 16.3 (H) 12/30/2020 0034   LYMPHSABS 3.0 11/17/2020 0237   MONOABS 0.9 11/17/2020 0237   EOSABS 0.1 11/17/2020 0237   BASOSABS 0.0 11/17/2020 0237   BMP Latest Ref Rng & Units 12/29/2020 12/29/2020 12/28/2020  Glucose 70 - 99 mg/dL - 112(H) 95  BUN 6 - 20 mg/dL - 21(H) 26(H)  Creatinine 0.44 - 1.00 mg/dL - 1.37(H) 1.64(H)  Sodium 135 - 145 mmol/L 143 140 139  Potassium 3.5 - 5.1 mmol/L 4.5 4.9 4.9  Chloride 98 - 111 mmol/L - 107 108  CO2 22 - 32 mmol/L - 19(L) 22  Calcium 8.9 - 10.3 mg/dL - 8.8(L) 8.8(L)   Chest imaging: CXR 12/28/20 The heart is normal in size. Normal unchanged mediastinal contours. There is mild diffuse bronchial and interstitial thickening. No focal airspace disease. No significant pleural effusion. No pneumothorax. Surgical hardware in the lower cervical spine is partially included.  PFT: No flowsheet data found.  Labs:  Path:  Echo 11/20/20: LV EF 70-75%, hyperdynamic function. Grade I diastolic dysfunction. Normal RV systolic function and size  is small. MV is normal.   Heart Catheterization:  Assessment & Plan:   Shortness of breath  Discussion: ***    Current Outpatient Medications:    acetaminophen (TYLENOL) 500 MG tablet, Take 500 mg by mouth every 6 (six)  hours as needed for moderate pain or headache., Disp: , Rfl:    albuterol (VENTOLIN HFA) 108 (90 Base) MCG/ACT inhaler, Inhale 2 puffs into the lungs every 6 (six) hours as needed for wheezing or shortness of breath., Disp: , Rfl:    allopurinol (ZYLOPRIM) 100 MG tablet, Take 100 mg by mouth daily., Disp: , Rfl:    amLODipine (NORVASC) 10 MG tablet, Take 1 tablet (10 mg total) by mouth daily., Disp: 30 tablet, Rfl: 3   atorvastatin (LIPITOR) 40 MG tablet, Take 1 tablet (40 mg total) by mouth daily., Disp: 90 tablet, Rfl: 3   buPROPion (WELLBUTRIN XL) 150 MG 24 hr tablet, Take 150 mg by mouth every morning., Disp: , Rfl:    methocarbamol (ROBAXIN) 750 MG tablet, Take 750 mg by mouth 2 (two) times daily as needed for muscle spasms., Disp: , Rfl:    metoprolol succinate (TOPROL-XL) 25 MG 24 hr tablet, Take 25 mg by mouth daily., Disp: , Rfl:    sucralfate (CARAFATE) 1 g tablet, Take 1 g by mouth 4 (four) times daily., Disp: , Rfl:

## 2022-01-10 ENCOUNTER — Ambulatory Visit: Payer: Medicare Other | Admitting: Adult Health

## 2022-01-11 ENCOUNTER — Ambulatory Visit: Payer: Medicare Other | Admitting: Neurology

## 2022-01-14 ENCOUNTER — Encounter: Payer: Self-pay | Admitting: Neurology

## 2022-01-16 ENCOUNTER — Institutional Professional Consult (permissible substitution): Payer: Medicare Other | Admitting: Pulmonary Disease

## 2022-02-21 ENCOUNTER — Encounter: Payer: Self-pay | Admitting: Emergency Medicine

## 2022-02-21 ENCOUNTER — Ambulatory Visit (INDEPENDENT_AMBULATORY_CARE_PROVIDER_SITE_OTHER): Payer: Medicare Other | Admitting: Emergency Medicine

## 2022-02-21 ENCOUNTER — Ambulatory Visit (INDEPENDENT_AMBULATORY_CARE_PROVIDER_SITE_OTHER): Payer: Medicare Other

## 2022-02-21 VITALS — BP 136/72 | HR 81 | Temp 98.3°F | Ht 64.5 in | Wt 214.4 lb

## 2022-02-21 DIAGNOSIS — R0600 Dyspnea, unspecified: Secondary | ICD-10-CM | POA: Insufficient documentation

## 2022-02-21 DIAGNOSIS — R0602 Shortness of breath: Secondary | ICD-10-CM | POA: Diagnosis not present

## 2022-02-21 DIAGNOSIS — Z720 Tobacco use: Secondary | ICD-10-CM

## 2022-02-21 DIAGNOSIS — R0609 Other forms of dyspnea: Secondary | ICD-10-CM | POA: Insufficient documentation

## 2022-02-21 NOTE — Assessment & Plan Note (Signed)
Suspect that she does have COPD based on her tobacco history, symptoms, prior chest x-ray.  We will perform pulmonary function testing to quantify her degree of obstruction.  Based on this likely start LABA/LAMA.  She can keep albuterol available to use as needed for now.  Discussed smoking cessation.  Other contributors to her dyspnea include obesity and associated restriction, deconditioning. ?

## 2022-02-21 NOTE — Patient Instructions (Addendum)
We will perform a chest x-ray today. ?We will perform pulmonary function testing at next office visit. ?Work hard to decrease her smoking.  Our ultimate goal will be to stop altogether. ?Follow with Dr. Lamonte Sakai next available with full pulmonary function testing on the same day.  ? ?

## 2022-02-21 NOTE — Progress Notes (Signed)
? ?Subjective:  ? ? Patient ID: Deborah Mcmahon, female    DOB: [birth year], 61 y.o.   MRN: [id number] ? ?HPI ?61 year old woman with a history of tobacco use (20 pack years), hypertension, substance abuse, CKD stage IIIa, chronic back pain, cerebrovascular disease.  She was admitted in February for symptomatic anemia, suspected GI bleeding, found to have a prepyloric ulcer, gastritis and a single colonic polyp, diverticulosis. ?She is here today to evaluate dyspnea.  ?She reports fatigue with walking, associated with heavy breathing. Would happen after about 117ft. Occasional cough. Will often hear wheeze. Uses albuterol 1-2x day, does seem to help her breathing.  ?Smokes .25 pk, used to smoke 0.5 pk ? ?Chest x-ray 12/28/2020 reviewed by me showed mild diffuse bronchitic changes and some interstitial thickening. ? ? ?Review of Systems ?As per HPI ? ?Past Medical History:  ?Diagnosis Date  ? Carpal tunnel syndrome   ? bilateral  ? Chronic back pain   ? spondylolisthesis  ? Constipation   ? takes an OTC stool softener daily as needed  ? Gallstones   ? History of migraine   ? Hypertension   ? Nocturia   ? Weakness   ? tingling   ?  ? ?Family History  ?Problem Relation Age of Onset  ? Anesthesia problems Neg Hx   ?  ? ?Social History  ? ?Socioeconomic History  ? Marital status: Single  ?  Spouse name: Not on file  ? Number of children: Not on file  ? Years of education: Not on file  ? Highest education level: Not on file  ?Occupational History  ? Not on file  ?Tobacco Use  ? Smoking status: Every Day  ?  Packs/day: 0.50  ?  Years: 41.00  ?  Pack years: 20.50  ?  Types: Cigarettes  ? Smokeless tobacco: Never  ? Tobacco comments:  ?  6 - 7 cigarettes smoked daily [facility] 02/21/22  ?Substance and Sexual Activity  ? Alcohol use: No  ? Drug use: No  ? Sexual activity: Yes  ?  Birth control/protection: Post-menopausal  ?Other Topics Concern  ? Not on file  ?Social History Narrative  ? Not on file  ? ?Social Determinants of Health   ? ?Financial Resource Strain: Not on file  ?Food Insecurity: Not on file  ?Transportation Needs: Not on file  ?Physical Activity: Not on file  ?Stress: Not on file  ?Social Connections: Not on file  ?Intimate Partner Violence: Not on file  ?  ?From [place], drove a bus.  ?No [employer]  ?No other exposures ? ?No Known Allergies  ? ?Outpatient Medications Prior to Visit  ?Medication Sig Dispense Refill  ? acetaminophen (TYLENOL) 500 MG tablet Take 500 mg by mouth every 6 (six) hours as needed for moderate pain or headache.    ? albuterol (VENTOLIN HFA) 108 (90 Base) MCG/ACT inhaler Inhale 2 puffs into the lungs every 6 (six) hours as needed for wheezing or shortness of breath.    ? allopurinol (ZYLOPRIM) 100 MG tablet Take 100 mg by mouth daily.    ? amLODipine (NORVASC) 10 MG tablet Take 1 tablet (10 mg total) by mouth daily. 30 tablet 3  ? atorvastatin (LIPITOR) 40 MG tablet Take 1 tablet (40 mg total) by mouth daily. 90 tablet 3  ? buPROPion (WELLBUTRIN XL) 150 MG 24 hr tablet Take 150 mg by mouth every morning.    ? methocarbamol (ROBAXIN) 750 MG tablet Take 750 mg by mouth 2 (two) times daily  as needed for muscle spasms.    ? metoprolol succinate (TOPROL-XL) 25 MG 24 hr tablet Take 25 mg by mouth daily.    ? sucralfate (CARAFATE) 1 g tablet Take 1 g by mouth 4 (four) times daily.    ? ?No facility-administered medications prior to visit.  ? ? ? ? ?   ?Objective:  ? Physical Exam ?Vitals:  ? 02/21/22 1447  ?BP: 136/72  ?Pulse: 81  ?Temp: 98.3 ?F (36.8 ?C)  ?TempSrc: Oral  ?Weight: 214 lb 6.4 oz (97.3 kg)  ?Height: 5' 4.5" (1.638 m)  ? ? ?Gen: Pleasant, obese woman, in no distress,  normal affect ? ?ENT: No lesions,  mouth clear,  oropharynx clear, no postnasal drip ? ?Neck: No JVD, no stridor ? ?Lungs: No use of accessory muscles, somewhat heavy respiration pattern but no upper airway noise, no distress, no crackles or wheezing on normal respiration, no wheeze on forced expiration ? ?Cardiovascular: RRR, heart sounds  normal, no murmur or gallops, no peripheral edema ? ?Musculoskeletal: No deformities, no cyanosis or clubbing ? ?Neuro: alert, awake, non focal ? ?Skin: Warm, no lesions or rash ? ? ?   ?Assessment & Plan:  ?Dyspnea ?Suspect that she does have COPD based on her tobacco history, symptoms, prior chest x-ray.  We will perform pulmonary function testing to quantify her degree of obstruction.  Based on this likely start LABA/LAMA.  She can keep albuterol available to use as needed for now.  Discussed smoking cessation.  Other contributors to her dyspnea include obesity and associated restriction, deconditioning. ? ?Tobacco abuse ?Discussed the importance of cessation with her today.  We will continue to work on this.  She is motivated to eventually stop. ? ?Baltazar Apo, MD, PhD ?02/21/2022, 3:08 PM ?[facility] ?[phone] or if no answer before 7:00PM call [phone] ?For any issues after 7:00PM please call eLink [phone] ? ? ?

## 2022-02-21 NOTE — Assessment & Plan Note (Signed)
Discussed the importance of cessation with her today.  We will continue to work on this.  She is motivated to eventually stop. ?

## 2022-03-30 ENCOUNTER — Encounter (HOSPITAL_COMMUNITY): Payer: Self-pay | Admitting: *Deleted

## 2022-03-30 ENCOUNTER — Emergency Department (HOSPITAL_COMMUNITY)
Admission: EM | Admit: 2022-03-30 | Discharge: 2022-03-30 | Disposition: A | Discharge status: Home / Self Care | Payer: Medicare Other | Attending: Emergency Medicine | Admitting: Emergency Medicine

## 2022-03-30 ENCOUNTER — Emergency Department (HOSPITAL_COMMUNITY): Payer: Medicare Other

## 2022-03-30 ENCOUNTER — Other Ambulatory Visit: Payer: Self-pay

## 2022-03-30 DIAGNOSIS — Z79899 Other long term (current) drug therapy: Secondary | ICD-10-CM | POA: Diagnosis not present

## 2022-03-30 DIAGNOSIS — R0789 Other chest pain: Secondary | ICD-10-CM | POA: Diagnosis not present

## 2022-03-30 DIAGNOSIS — I10 Essential (primary) hypertension: Secondary | ICD-10-CM | POA: Insufficient documentation

## 2022-03-30 DIAGNOSIS — R0602 Shortness of breath: Secondary | ICD-10-CM | POA: Diagnosis not present

## 2022-03-30 DIAGNOSIS — M542 Cervicalgia: Secondary | ICD-10-CM | POA: Insufficient documentation

## 2022-03-30 DIAGNOSIS — M25512 Pain in left shoulder: Secondary | ICD-10-CM | POA: Insufficient documentation

## 2022-03-30 DIAGNOSIS — R2243 Localized swelling, mass and lump, lower limb, bilateral: Secondary | ICD-10-CM | POA: Diagnosis not present

## 2022-03-30 LAB — BASIC METABOLIC PANEL
Anion gap: 6 (ref 5–15)
BUN: 30 mg/dL — ABNORMAL HIGH (ref 8–23)
CO2: 22 mmol/L (ref 22–32)
Calcium: 8.9 mg/dL (ref 8.9–10.3)
Chloride: 113 mmol/L — ABNORMAL HIGH (ref 98–111)
Creatinine, Ser: 1.25 mg/dL — ABNORMAL HIGH (ref 0.44–1.00)
GFR, Estimated: 49 mL/min — ABNORMAL LOW (ref >60)
Glucose, Bld: 107 mg/dL — ABNORMAL HIGH (ref 70–99)
Potassium: 4 mmol/L (ref 3.5–5.1)
Sodium: 141 mmol/L (ref 135–145)

## 2022-03-30 LAB — TROPONIN I (HIGH SENSITIVITY)
Troponin I (High Sensitivity): 19 ng/L — ABNORMAL HIGH (ref <18)
Troponin I (High Sensitivity): 20 ng/L — ABNORMAL HIGH (ref <18)

## 2022-03-30 LAB — CBC
HCT: 35.1 % — ABNORMAL LOW (ref 36.0–46.0)
Hemoglobin: 11.5 g/dL — ABNORMAL LOW (ref 12.0–15.0)
MCH: 30.3 pg (ref 26.0–34.0)
MCHC: 32.8 g/dL (ref 30.0–36.0)
MCV: 92.6 fL (ref 80.0–100.0)
Platelets: 204 10*3/uL (ref 150–400)
RBC: 3.79 MIL/uL — ABNORMAL LOW (ref 3.87–5.11)
RDW: 16.2 % — ABNORMAL HIGH (ref 11.5–15.5)
WBC: 8 10*3/uL (ref 4.0–10.5)
nRBC: 0 % (ref 0.0–0.2)

## 2022-03-30 NOTE — ED Triage Notes (Addendum)
Sent from [facility] for evaluation of EKG. No EKG sent with pt to compare. Seen for left neck and upper shoulder pain x 2 days ?

## 2022-03-30 NOTE — Discharge Instructions (Addendum)
Your work up today did not show evidence of an active heart attack. However this does not mean you do not have heart disease. I recommend you follow up with cardiology. I have given you a referral to a [facility] and you should receive a call from their office. If you prefer to follow up with [facility] medical cardiologist you can set one up through your PCP. If you have any chest pain, lightheadedness, worsening shortness of breath please return immediately to the emergency room.  ?

## 2022-03-30 NOTE — ED Provider Notes (Signed)
?[facility]-EMERGENCY DEPT ?Provider Note ? ? ?CSN: [id number] ?Arrival date & time: 03/30/22  1308 ? ?  ? ?History ? ?No chief complaint on file. ? ? ?Deborah Mcmahon is a 61 y.o. female. ? ?61 year old female presents today for evaluation of abnormal EKG from her PCPs office.  Patient states she went in for routine follow-up for her carpal tunnel.  Patient mentioned that she was having some left-sided neck and shoulder pain along with shortness of breath.  EKG was obtained and patient was sent for further evaluation.  Patient states shortness of breath is not new for her however it was worse today.  Patient is seeing a pulmonologist at [facility] for work-up of her shortness of breath.  She has history of prior neck surgery however this was in early 2000's.  She denies recent injury to her neck.  Patient currently reports resolution of her shortness of breath.  Patient is eager to go home.  She denies recent travel by car, or flight.  Leg pain or leg swelling, exogenous estrogen use.  She does not have history of prior DVT or PE. ? ?The history is provided by the patient. No language interpreter was used.  ? ?  ? ?Home Medications ?Prior to Admission medications   ?Medication Sig Start Date End Date Taking? Authorizing Provider  ?acetaminophen (TYLENOL) 500 MG tablet Take 500 mg by mouth every 6 (six) hours as needed for moderate pain or headache.    [provider]  ?albuterol (VENTOLIN HFA) 108 (90 Base) MCG/ACT inhaler Inhale 2 puffs into the lungs every 6 (six) hours as needed for wheezing or shortness of breath.    [provider]  ?allopurinol (ZYLOPRIM) 100 MG tablet Take 100 mg by mouth daily. 12/27/20   [provider]  ?amLODipine (NORVASC) 10 MG tablet Take 1 tablet (10 mg total) by mouth daily. 11/17/20   Lewie Chamber, MD  ?atorvastatin (LIPITOR) 40 MG tablet Take 1 tablet (40 mg total) by mouth daily. 11/17/20   Lewie Chamber, MD  ?buPROPion (WELLBUTRIN XL)  150 MG 24 hr tablet Take 150 mg by mouth every morning. 11/29/20   [provider]  ?methocarbamol (ROBAXIN) 750 MG tablet Take 750 mg by mouth 2 (two) times daily as needed for muscle spasms.    [provider]  ?metoprolol succinate (TOPROL-XL) 25 MG 24 hr tablet Take 25 mg by mouth daily. 12/27/20   [provider]  ?sucralfate (CARAFATE) 1 g tablet Take 1 g by mouth 4 (four) times daily. 12/14/20   [provider]  ?   ? ?Allergies    ?Patient has no known allergies.   ? ?Review of Systems   ?Review of Systems  ?Constitutional:  Negative for chills, diaphoresis and fever.  ?Respiratory:  Negative for shortness of breath.   ?Cardiovascular:  Negative for chest pain.  ?Gastrointestinal:  Negative for abdominal pain and nausea.  ?Musculoskeletal:  Positive for neck pain. Negative for neck stiffness.  ?Neurological:  Negative for light-headedness.  ?All other systems reviewed and are negative. ? ?Physical Exam ?Updated Vital Signs ?BP (!) 146/74 (BP Location: Left Arm)   Pulse 77   Temp 97.6 ?F (36.4 ?C) (Oral)   Resp 15   Ht 5\' 4"  (1.626 m)   Wt 98.4 kg   SpO2 93%   BMI 37.25 kg/m?  ?Physical Exam ?Vitals and nursing note reviewed.  ?Constitutional:   ?   General: She is not in acute distress. ?  Appearance: Normal appearance. She is not ill-appearing.  ?HENT:  ?   Head: Normocephalic and atraumatic.  ?   Nose: Nose normal.  ?Eyes:  ?   General: No scleral icterus. ?   Extraocular Movements: Extraocular movements intact.  ?   Conjunctiva/sclera: Conjunctivae normal.  ?Cardiovascular:  ?   Rate and Rhythm: Normal rate and regular rhythm.  ?   Pulses: Normal pulses.  ?Pulmonary:  ?   Effort: Pulmonary effort is normal. No respiratory distress.  ?   Breath sounds: Normal breath sounds. No wheezing or rales.  ?Abdominal:  ?   General: There is no distension.  ?   Palpations: Abdomen is soft.  ?   Tenderness: There is no abdominal tenderness. There is no guarding.   ?Musculoskeletal:     ?   General: Normal range of motion.  ?   Cervical back: Normal range of motion.  ?   Right lower leg: Edema (Trace pitting edema) present.  ?   Left lower leg: Edema (Trace pitting edema) present.  ?Skin: ?   General: Skin is warm and dry.  ?Neurological:  ?   General: No focal deficit present.  ?   Mental Status: She is alert. Mental status is at baseline.  ? ? ?ED Results / Procedures / Treatments   ?Labs ?(all labs ordered are listed, but only abnormal results are displayed) ?Labs Reviewed  ?BASIC METABOLIC PANEL  ?CBC  ?TROPONIN I (HIGH SENSITIVITY)  ? ? ?EKG ?None ? ?Radiology ?No results found. ? ?Procedures ?Procedures  ? ? ?Medications Ordered in ED ?Medications - No data to display ? ?ED Course/ Medical Decision Making/ A&P ?Clinical Course as of 03/30/22 1527  ?Sat Mar 30, 2022  ?1420 DG Chest Port 1 View [AA]  ?1516 CBC without leukocytosis.  Anemia with hemoglobin of 11 point however this is above patient's baseline.  BMP with creatinine of 1.25 otherwise without acute findings troponin was slightly elevated at 19.  We will try and.  Chest x-ray without acute cardiopulmonary process.  EKG without acute ischemic changes. [AA]  ?  ?Clinical Course User Index ?Verne Grain[AA] Jorma Tassinari, PA-C  ? ?                        ?Medical Decision Making ?Amount and/or Complexity of Data Reviewed ?Radiology:  Decision-making details documented in ED Course. ? ? ?Medical Decision Making / ED Course ? ? ?This patient presents to the ED for concern of left-sided neck pain, left shoulder pain, shortness of breath, this involves an extensive number of treatment options, and is a complaint that carries with it a high risk of complications and morbidity.  The differential diagnosis includes MSK injury, COPD, pneumonia, ACS, arthritis ? ?MDM: ?61 year old female presents today for evaluation of left-sided neck pain, left shoulder pain, with associated shortness of breath.  Patient was evaluated at PCP office and  was referred to the emergency room for evaluation.  Patient had an EKG done which evidently was concerning in the office.  Patient states that over the past months she has had some shortness of breath which was worse today.  She received a breathing treatment in the PCP office with resolution of her shortness of breath.  Patient does follow with a pulmonologist at [facility] for work-up of her shortness of breath.  She was told that this may be secondary to COPD.  She states the pain in her neck and shoulder has been ongoing for the  past 2 days.  Denies prior history of CAD, or MI.  She is without any chest pain.  Low risk for PE on Wells criteria.  Doubt PE.  Will evaluate with ACS work-up. ?Patient second troponin is 20.  Which is a delta of 1.  Patient reports improvement in her neck pain that she presented with.  She remained without chest pain, or shortness of breath throughout her emergency room stay.  Given patient's symptoms have been ongoing for 3 days.  She is remained without chest pain.  Unlikely to be ACS.  However given her risk factors for cardiac disease including prior stroke, hypertension, we will provide her with cardiology follow-up.  Ambulatory referral to cardiology given.  Patient and daughter voiced understanding and are in agreement with plan. ? ?Lab Tests: ?-I ordered, reviewed, and interpreted labs.   ?The pertinent results include:   ?Labs Reviewed  ?BASIC METABOLIC PANEL  ?CBC  ?TROPONIN I (HIGH SENSITIVITY)  ?  ? ? ?EKG ? EKG Interpretation ? ?Date/Time:    ?Ventricular Rate:    ?PR Interval:    ?QRS Duration:   ?QT Interval:    ?QTC Calculation:   ?R Axis:     ?Text Interpretation:   ?  ? ?  ? ? ? ?Imaging Studies ordered: ?I ordered imaging studies including CXR ?I independently visualized and interpreted imaging. ?I agree with the radiologist interpretation ? ? ?Medicines ordered and prescription drug management: ?No orders of the defined types were placed in this encounter. ?  ?-I have  reviewed the patients home medicines and have made adjustments as needed ? ?Reevaluation: ?After the interventions noted above, I reevaluated the patient and found that they have :improved ? ?Co morbidities that complicate

## 2022-03-30 NOTE — ED Provider Triage Note (Signed)
Emergency Medicine Provider Triage Evaluation Note ? ?Deborah Mcmahon , a 61 y.o. female  was evaluated in triage.  Pt complains of abnormal ekg. Pt report she went to see her PCP for f/u of her carpal tunnel problem in R wrist.  However, she report her doctor did a work up and was concerns about her heart because she has been having neck and L shoulder pain and some sob x 3 days.  No dizziness, cp, nausea or diaphoresis.  Hx of tobacco use ? ?Review of Systems  ?Positive: As above ?Negative: As above ? ?Physical Exam  ?Ht 5\' 4"  (1.626 m)   Wt 98.4 kg   BMI 37.25 kg/m?  ?Gen:   Awake, no distress   ?Resp:  Normal effort  ?MSK:   Moves extremities without difficulty  ?Other:   ? ?Medical Decision Making  ?Medically screening exam initiated at 1:24 PM.  Appropriate orders placed.  Deborah Mcmahon was informed that the remainder of the evaluation will be completed by another provider, this initial triage assessment does not replace that evaluation, and the importance of remaining in the ED until their evaluation is complete. ? ?No EKG available from PCP ?  ?Domenic Moras, PA-C ?03/30/22 1326 ? ?

## 2022-04-02 ENCOUNTER — Telehealth: Payer: Self-pay | Admitting: Emergency Medicine

## 2022-04-02 NOTE — Telephone Encounter (Signed)
Called patient but she did not answer. Left message for her to call back.  

## 2022-04-02 NOTE — Telephone Encounter (Signed)
Patient called wanting to know if her nebulizer machine was called in. States that starting this morning, she has had trouble breathing and the nebulizer treatment helps the most. I do not see an order for a nebulizer machine. She is currently not taking any medications.  ? ?Gave the call back number, [phone]. ?

## 2022-04-03 ENCOUNTER — Ambulatory Visit (INDEPENDENT_AMBULATORY_CARE_PROVIDER_SITE_OTHER): Payer: Medicare Other | Admitting: Pulmonary Disease

## 2022-04-03 ENCOUNTER — Encounter: Payer: Self-pay | Admitting: Pulmonary Disease

## 2022-04-03 VITALS — BP 140/84 | HR 76 | Temp 98.3°F | Ht 64.0 in | Wt 221.0 lb

## 2022-04-03 DIAGNOSIS — R0602 Shortness of breath: Secondary | ICD-10-CM | POA: Diagnosis not present

## 2022-04-03 DIAGNOSIS — R062 Wheezing: Secondary | ICD-10-CM

## 2022-04-03 DIAGNOSIS — J441 Chronic obstructive pulmonary disease with (acute) exacerbation: Secondary | ICD-10-CM

## 2022-04-03 DIAGNOSIS — Z720 Tobacco use: Secondary | ICD-10-CM

## 2022-04-03 MED ORDER — PREDNISONE 10 MG PO TABS: ORAL_TABLET | ORAL | 0 refills | Status: DC

## 2022-04-03 MED ORDER — DOXYCYCLINE HYCLATE 100 MG PO TABS
100.0000 mg | ORAL_TABLET | Freq: Two times a day (BID) | ORAL | 0 refills | Status: DC
Start: 2022-04-03 — End: 2022-06-25

## 2022-04-03 NOTE — Progress Notes (Signed)
? ?Synopsis: Referred in May 2023 for acute visit for SOB/AECOPD by Loura Back, NP ? ?Subjective:  ? ?PATIENT ID: Deborah Mcmahon GENDER: female DOB: 17-Dec-1960, MRN: [id number] ? ?Chief Complaint  ?Patient presents with  ? Follow-up  ?  Patient is here to talk about shortness of breath.   ? ? ?This is a 61 year old female, past medical history of hypertension, carpal tunnel, chronic back pain, longstanding history of smoking.  Patient has smoked cigarettes since she was a teenager for 40+ years at max and about 1/2 pack/day.  So she does have a 20-pack-year history of smoking.  Not currently enrolled in lung cancer screening program.  She had a recent chest x-ray due to shortness of breath evaluation.  Over the past couple days she has had increased shortness of breath cough sputum production.  She has increased chest tightness.  She is having trouble breathing when she is walking. ? ? ?Past Medical History:  ?Diagnosis Date  ? Carpal tunnel syndrome   ? bilateral  ? Chronic back pain   ? spondylolisthesis  ? Constipation   ? takes an OTC stool softener daily as needed  ? Gallstones   ? History of migraine   ? Hypertension   ? Nocturia   ? Weakness   ? tingling   ?  ? ?Family History  ?Problem Relation Age of Onset  ? Anesthesia problems Neg Hx   ?  ? ?Past Surgical History:  ?Procedure Laterality Date  ? BACK SURGERY    ? fusion  ? BIOPSY  12/31/2020  ? Procedure: BIOPSY;  Surgeon: Lemar Lofty., MD;  Location: [facility] ENDOSCOPY;  Service: Gastroenterology;;  ? CERVICAL SPINE SURGERY    ? x 4-fusion  ? COLONOSCOPY WITH PROPOFOL N/A 12/31/2020  ? Procedure: COLONOSCOPY WITH PROPOFOL;  Surgeon: Mansouraty, Netty Starring., MD;  Location: [facility] ENDOSCOPY;  Service: Gastroenterology;  Laterality: N/A;  ? ESOPHAGOGASTRODUODENOSCOPY (EGD) WITH PROPOFOL N/A 12/31/2020  ? Procedure: ESOPHAGOGASTRODUODENOSCOPY (EGD) WITH PROPOFOL;  Surgeon: Meridee Score Netty Starring., MD;  Location: [facility] ENDOSCOPY;  Service: Gastroenterology;   Laterality: N/A;  ? MYRINGOTOMY    ? POLYPECTOMY  12/31/2020  ? Procedure: POLYPECTOMY;  Surgeon: Mansouraty, Netty Starring., MD;  Location: [facility] ENDOSCOPY;  Service: Gastroenterology;;  ? right knee surgery    ? screws  ? TONSILLECTOMY AND ADENOIDECTOMY    ? TUBAL LIGATION    ? ? ?Social History  ? ?Socioeconomic History  ? Marital status: Single  ?  Spouse name: Not on file  ? Number of children: Not on file  ? Years of education: Not on file  ? Highest education level: Not on file  ?Occupational History  ? Not on file  ?Tobacco Use  ? Smoking status: Every Day  ?  Packs/day: 0.50  ?  Years: 41.00  ?  Pack years: 20.50  ?  Types: Cigarettes  ? Smokeless tobacco: Never  ? Tobacco comments:  ?  6 - 7 cigarettes smoked daily ARJ 02/21/22  ?Substance and Sexual Activity  ? Alcohol use: No  ? Drug use: No  ? Sexual activity: Yes  ?  Birth control/protection: Post-menopausal  ?Other Topics Concern  ? Not on file  ?Social History Narrative  ? Not on file  ? ?Social Determinants of Health  ? ?Financial Resource Strain: Not on file  ?Food Insecurity: Not on file  ?Transportation Needs: Not on file  ?Physical Activity: Not on file  ?Stress: Not on file  ?Social Connections: Not on file  ?Intimate  Partner Violence: Not on file  ?  ? ?No Known Allergies  ? ?Outpatient Medications Prior to Visit  ?Medication Sig Dispense Refill  ? acetaminophen (TYLENOL) 500 MG tablet Take 500 mg by mouth every 6 (six) hours as needed for moderate pain or headache.    ? albuterol (VENTOLIN HFA) 108 (90 Base) MCG/ACT inhaler Inhale 2 puffs into the lungs every 6 (six) hours as needed for wheezing or shortness of breath.    ? allopurinol (ZYLOPRIM) 100 MG tablet Take 100 mg by mouth daily.    ? amLODipine (NORVASC) 10 MG tablet Take 1 tablet (10 mg total) by mouth daily. 30 tablet 3  ? atorvastatin (LIPITOR) 40 MG tablet Take 1 tablet (40 mg total) by mouth daily. 90 tablet 3  ? buPROPion (WELLBUTRIN XL) 150 MG 24 hr tablet Take 150 mg by mouth every  morning.    ? methocarbamol (ROBAXIN) 750 MG tablet Take 750 mg by mouth 2 (two) times daily as needed for muscle spasms.    ? metoprolol succinate (TOPROL-XL) 25 MG 24 hr tablet Take 25 mg by mouth daily.    ? sucralfate (CARAFATE) 1 g tablet Take 1 g by mouth 4 (four) times daily.    ? ?No facility-administered medications prior to visit.  ? ? ?Review of Systems  ?Constitutional:  Negative for chills, fever, malaise/fatigue and weight loss.  ?HENT:  Negative for hearing loss, sore throat and tinnitus.   ?Eyes:  Negative for blurred vision and double vision.  ?Respiratory:  Positive for cough, shortness of breath and wheezing. Negative for hemoptysis, sputum production and stridor.   ?Cardiovascular:  Negative for chest pain, palpitations, orthopnea, leg swelling and PND.  ?Gastrointestinal:  Negative for abdominal pain, constipation, diarrhea, heartburn, nausea and vomiting.  ?Genitourinary:  Negative for dysuria, hematuria and urgency.  ?Musculoskeletal:  Negative for joint pain and myalgias.  ?Skin:  Negative for itching and rash.  ?Neurological:  Negative for dizziness, tingling, weakness and headaches.  ?Endo/Heme/Allergies:  Negative for environmental allergies. Does not bruise/bleed easily.  ?Psychiatric/Behavioral:  Negative for depression. The patient is not nervous/anxious and does not have insomnia.   ?All other systems reviewed and are negative. ? ? ?Objective:  ?Physical Exam ?Vitals reviewed.  ?Constitutional:   ?   General: She is not in acute distress. ?   Appearance: She is well-developed.  ?HENT:  ?   Head: Normocephalic and atraumatic.  ?Eyes:  ?   General: No scleral icterus. ?   Conjunctiva/sclera: Conjunctivae normal.  ?   Pupils: Pupils are equal, round, and reactive to light.  ?Neck:  ?   Vascular: No JVD.  ?   Trachea: No tracheal deviation.  ?Cardiovascular:  ?   Rate and Rhythm: Normal rate and regular rhythm.  ?   Heart sounds: Normal heart sounds. No murmur heard. ?Pulmonary:  ?    Effort: Pulmonary effort is normal. No tachypnea, accessory muscle usage or respiratory distress.  ?   Breath sounds: No stridor. No wheezing, rhonchi or rales.  ?Abdominal:  ?   Palpations: Abdomen is soft.  ?Musculoskeletal:     ?   General: No tenderness.  ?   Cervical back: Neck supple.  ?Lymphadenopathy:  ?   Cervical: No cervical adenopathy.  ?Skin: ?   General: Skin is warm and dry.  ?   Capillary Refill: Capillary refill takes less than 2 seconds.  ?   Findings: No rash.  ?Neurological:  ?   Mental Status: She is alert  and oriented to person, place, and time.  ?Psychiatric:     ?   Behavior: Behavior normal.  ? ? ? ?Vitals:  ? 04/03/22 1558  ?BP: 140/84  ?Pulse: 76  ?Temp: 98.3 ?F (36.8 ?C)  ?TempSrc: Oral  ?SpO2: 97%  ?Weight: 221 lb (100.2 kg)  ?Height: 5\' 4"  (1.626 m)  ? ?97% on RA ?BMI Readings from Last 3 Encounters:  ?04/03/22 37.93 kg/m?  ?03/30/22 37.25 kg/m?  ?02/21/22 36.23 kg/m?  ? ?Wt Readings from Last 3 Encounters:  ?04/03/22 221 lb (100.2 kg)  ?03/30/22 217 lb (98.4 kg)  ?02/21/22 214 lb 6.4 oz (97.3 kg)  ? ? ? ?CBC ?   ?Component Value Date/Time  ? WBC 8.0 03/30/2022 1400  ? RBC 3.79 (L) 03/30/2022 1400  ? HGB 11.5 (L) 03/30/2022 1400  ? HCT 35.1 (L) 03/30/2022 1400  ? PLT 204 03/30/2022 1400  ? MCV 92.6 03/30/2022 1400  ? MCH 30.3 03/30/2022 1400  ? MCHC 32.8 03/30/2022 1400  ? RDW 16.2 (H) 03/30/2022 1400  ? LYMPHSABS 3.0 11/17/2020 0237  ? MONOABS 0.9 11/17/2020 0237  ? EOSABS 0.1 11/17/2020 0237  ? BASOSABS 0.0 11/17/2020 0237  ? ? ? ?Chest Imaging: ?March 2023 chest x-ray: ?Clear no infiltrate. ?The patient's images have been independently reviewed by me.   ? ?Pulmonary Functions Testing Results: ?   ? View : No data to display.  ?  ?  ?  ? ? ?FeNO:  ? ?Pathology:  ? ?Echocardiogram:  ? ?Heart Catheterization:  ?   ?Assessment & Plan:  ? ?  ICD-10-CM   ?1. COPD with acute exacerbation (HCC)  J44.1   ?  ?2. Tobacco abuse  Z72.0 Ambulatory Referral for Lung Cancer Scre  ?  ?3. SOB  (shortness of breath)  R06.02   ?  ?4. Wheezing  R06.2   ?  ? ? ?Discussion: ? ?This is a 61 year old female here today for acute visit, shortness of breath likely exacerbation of COPD.  She quit smoking 3 days ago so she i

## 2022-04-03 NOTE — Telephone Encounter (Signed)
Deborah Mcmahon daughter [name] returning phone call. States patient legs are swollen. [facility] phone number is [phone]. ?

## 2022-04-03 NOTE — Patient Instructions (Signed)
Thank you for visiting Dr. Valeta Harms at [facility]. ?Today we recommend the following: ?Orders Placed This Encounter  ?Procedures  ? Ambulatory Referral for Lung Cancer Scre  ? ?Meds ordered this encounter  ?Medications  ? predniSONE (DELTASONE) 10 MG tablet  ?  Sig: Take 4 tabs by mouth once daily x4 days, then 3 tabs x4 days, 2 tabs x4 days, 1 tab x4 days and stop.  ?  Dispense:  40 tablet  ?  Refill:  0  ? doxycycline (VIBRA-TABS) 100 MG tablet  ?  Sig: Take 1 tablet (100 mg total) by mouth 2 (two) times daily.  ?  Dispense:  14 tablet  ?  Refill:  0  ? ?DME supply orders for albuterol nebulizer  ? ?Return in about 13 days (around 04/16/2022) for w/ Dr. Lamonte Sakai, keep appt . ? ? ? ?Please do your part to reduce the spread of COVID-19.  ? ?

## 2022-04-03 NOTE — Telephone Encounter (Signed)
Called and spoke with Sheryle Hail who states that patient legs are swollen and she is having increased shortness of breath. Patient has been scheduled for 3:45 today with Dr. Tonia Brooms. Nothing further needed at this time. ?

## 2022-04-04 ENCOUNTER — Ambulatory Visit (INDEPENDENT_AMBULATORY_CARE_PROVIDER_SITE_OTHER): Payer: Medicare Other | Admitting: Pulmonary Disease

## 2022-04-04 DIAGNOSIS — R0602 Shortness of breath: Secondary | ICD-10-CM

## 2022-04-04 LAB — PULMONARY FUNCTION TEST
DL/VA % pred: 96 %
DL/VA: 4.07 ml/min/mmHg/L
DLCO cor % pred: 62 %
DLCO cor: 12.58 ml/min/mmHg
DLCO unc % pred: 58 %
DLCO unc: 11.78 ml/min/mmHg
FEF 25-75 Post: 1.79 L/sec
FEF 25-75 Pre: 1.7 L/sec
FEF2575-%Change-Post: 5 %
FEF2575-%Pred-Post: 89 %
FEF2575-%Pred-Pre: 85 %
FEV1-%Change-Post: 3 %
FEV1-%Pred-Post: 83 %
FEV1-%Pred-Pre: 80 %
FEV1-Post: 1.7 L
FEV1-Pre: 1.64 L
FEV1FVC-%Change-Post: -1 %
FEV1FVC-%Pred-Pre: 102 %
FEV6-%Change-Post: 2 %
FEV6-%Pred-Post: 82 %
FEV6-%Pred-Pre: 81 %
FEV6-Post: 2.09 L
FEV6-Pre: 2.04 L
FEV6FVC-%Pred-Post: 103 %
FEV6FVC-%Pred-Pre: 103 %
FVC-%Change-Post: 4 %
FVC-%Pred-Post: 82 %
FVC-%Pred-Pre: 78 %
FVC-Post: 2.14 L
FVC-Pre: 2.04 L
Post FEV1/FVC ratio: 79 %
Post FEV6/FVC ratio: 100 %
Pre FEV1/FVC ratio: 81 %
Pre FEV6/FVC Ratio: 100 %
RV % pred: 77 %
RV: 1.57 L
TLC % pred: 82 %
TLC: 4.17 L

## 2022-04-04 NOTE — Patient Instructions (Signed)
Performed Full PFT Today.   ?

## 2022-04-04 NOTE — Progress Notes (Signed)
Performed Full PFT today.  ?

## 2022-04-14 NOTE — Progress Notes (Signed)
Cardiology Office Note:    Date:  02/07/2023   ID:  Deborah Mcmahon, DOB 06/05/1961, MRN [id number]  PCP:  Arthur Holms, NP   [facility] Providers Cardiologist:  Lenna Sciara, MD Referring MD: Evlyn Courier, PA-C   Chief Complaint/Reason for Referral:  Chest pain  PATIENT DID NOT APPEAR FOR APPOINTMENT   ASSESSMENT:    1. Precordial pain   2. Essential hypertension   3. Tobacco abuse   4. Stage 3 chronic kidney disease, unspecified whether stage 3a or 3b CKD ([place])     PLAN:    In order of problems listed above: 1.  Chest pain:   2.  Hypertension: Given history of severe left ventricular hypertrophy we will start losartan 25 mg daily and check a BMP in 1 week's time. 3.  Tobacco abuse: Stressed need to abstain from tobacco.   4.  Stage III chronic kidney disease: Check BMP next week.  Creatinine is around 1.25 with a GFR in the 40s.          Dispo:  No follow-ups on file.        History of Present Illness:    FOCUSED PROBLEM LIST:   Hypertension Tobacco abuse, long-standing BMI 37 CKD stage 3, Cr ~1.2  The patient is a 61 y.o. female with the indicated medical history here for chest pain.  The patient was seen by her pulmonologist recently and reported increasing dyspnea and chest discomfort.  She had presented to the ER earlier this month with left neck and shoulder pain.  Her HS-troponins were minimally elevated and an EKG was reassuring.  She was discharged home.         Current Medications: No outpatient medications have been marked as taking for the 04/15/22 encounter (Office Visit) with Deborah Osmond, MD.     Allergies:    Patient has no known allergies.   Social History:   Social History   Tobacco Use   Smoking status: Every Day    Packs/day: 0.50    Years: 41.00    Additional pack years: 0.00    Total pack years: 20.50    Types: Cigarettes   Smokeless tobacco: Never   Tobacco comments:    6 - 7 cigarettes smoked daily ARJ 02/21/22   Substance Use Topics   Alcohol use: No   Drug use: No     Family Hx: Family History  Problem Relation Age of Onset   Anesthesia problems Neg Hx      Review of Systems:   Please see the history of present illness.    All other systems reviewed and are negative.     EKGs/Labs/Other Test Reviewed:    EKG:  EKG performed May 2023 that I personally reviewed demonstrates NSR with anterior infarction pattern  Prior CV studies:  TTE 2021 with EF 70%, severe LVH with no significant valve issues   Other studies Reviewed: Review of the additional studies/records demonstrates: U/S 2014 without aortic pathology  Recent Labs: 03/30/2022: Platelets 204 05/09/2022: Hemoglobin 12.3; NT-Pro BNP 702 06/26/2022: BUN 26; Creatinine, Ser 1.61; Potassium 4.5; Sodium 146   Recent Lipid Panel Lab Results  Component Value Date/Time   CHOL 177 11/16/2020 03:23 AM   TRIG 119 11/16/2020 03:23 AM   HDL 34 (L) 11/16/2020 03:23 AM   LDLCALC 119 (H) 11/16/2020 03:23 AM    Risk Assessment/Calculations:      Physical Exam:      Signed, Deborah Osmond, MD  02/07/2023 9:05 AM  [facility] [place], [place], [place]  [address] Phone: [phone]; Fax: [phone]   Note:  This document was prepared using Dragon voice recognition software and may include unintentional dictation errors.

## 2022-04-15 ENCOUNTER — Ambulatory Visit (INDEPENDENT_AMBULATORY_CARE_PROVIDER_SITE_OTHER): Payer: Medicare Other | Admitting: Internal Medicine

## 2022-04-15 DIAGNOSIS — I10 Essential (primary) hypertension: Secondary | ICD-10-CM

## 2022-04-15 DIAGNOSIS — Z720 Tobacco use: Secondary | ICD-10-CM

## 2022-04-15 DIAGNOSIS — R072 Precordial pain: Secondary | ICD-10-CM

## 2022-04-15 DIAGNOSIS — N183 Chronic kidney disease, stage 3 unspecified: Secondary | ICD-10-CM

## 2022-04-16 ENCOUNTER — Ambulatory Visit: Payer: Medicare Other | Admitting: Emergency Medicine

## 2022-04-19 ENCOUNTER — Encounter: Payer: Self-pay | Admitting: Nurse Practitioner

## 2022-04-19 ENCOUNTER — Ambulatory Visit (INDEPENDENT_AMBULATORY_CARE_PROVIDER_SITE_OTHER): Payer: Medicare Other | Admitting: Nurse Practitioner

## 2022-04-19 ENCOUNTER — Ambulatory Visit (INDEPENDENT_AMBULATORY_CARE_PROVIDER_SITE_OTHER): Payer: Medicare Other

## 2022-04-19 ENCOUNTER — Ambulatory Visit: Payer: Medicare Other | Admitting: Neurology

## 2022-04-19 VITALS — BP 138/66 | HR 85 | Temp 98.1°F | Ht 64.0 in | Wt 218.6 lb

## 2022-04-19 DIAGNOSIS — J3089 Other allergic rhinitis: Secondary | ICD-10-CM | POA: Diagnosis not present

## 2022-04-19 DIAGNOSIS — J441 Chronic obstructive pulmonary disease with (acute) exacerbation: Secondary | ICD-10-CM

## 2022-04-19 DIAGNOSIS — R0609 Other forms of dyspnea: Secondary | ICD-10-CM | POA: Diagnosis not present

## 2022-04-19 DIAGNOSIS — R0602 Shortness of breath: Secondary | ICD-10-CM | POA: Diagnosis not present

## 2022-04-19 DIAGNOSIS — J309 Allergic rhinitis, unspecified: Secondary | ICD-10-CM | POA: Insufficient documentation

## 2022-04-19 LAB — POCT EXHALED NITRIC OXIDE: FeNO level (ppb): 5

## 2022-04-19 MED ORDER — FLUTICASONE PROPIONATE 50 MCG/ACT NA SUSP: 2.0000 | Freq: Every day | NASAL | 2 refills | Status: DC

## 2022-04-19 MED ORDER — FLUTICASONE FUROATE-VILANTEROL 100-25 MCG/ACT IN AEPB: 1.0000 | INHALATION_SPRAY | Freq: Every day | RESPIRATORY_TRACT | 0 refills | Status: DC

## 2022-04-19 NOTE — Patient Instructions (Addendum)
Trial Breo inhaler 1 puff daily. Brush tongue and rinse mouth afterwards.  Continue Albuterol inhaler 2 puffs or 3 mL neb every 6 hours as needed for shortness of breath or wheezing. Notify if symptoms persist despite rescue inhaler/neb use. Continue protonix 40 mg daily for reflux   Flonase nasal spray 2 sprays each nostril daily for nasal congestion/allergies Claritin 10 mg daily for allergies   Chest x ray today. We will notify you of any abnormal results  Follow up in one month with Dr. Tonia Brooms or Florentina Addison Deborah Lazcano,NP. If symptoms do not improve or worsen, please contact office for sooner follow up or seek emergency care.

## 2022-04-19 NOTE — Assessment & Plan Note (Addendum)
Pulmonary function testing showed normal spirometry; did have a mild to moderate diffusion defect.  No formal diagnosis of COPD based on testing.  She is prednisone responsive and does have good response to albuterol as well she also has wheezing and some allergy type symptoms.  Advised that we could try her on an ICS/LABA to see if this helps with her DOE at all.  CXR today was clear. Former smoker. No other significant environmental or occupational exposures, or symptoms of connective tissue disease.  If no improvement, she will need further work-up with CT of the chest and cardiac work-up.  Patient Instructions  Trial Breo inhaler 1 puff daily. Brush tongue and rinse mouth afterwards.  Continue Albuterol inhaler 2 puffs or 3 mL neb every 6 hours as needed for shortness of breath or wheezing. Notify if symptoms persist despite rescue inhaler/neb use. Continue protonix 40 mg daily for reflux   Flonase nasal spray 2 sprays each nostril daily for nasal congestion/allergies Claritin 10 mg daily for allergies   Chest x ray today. We will notify you of any abnormal results  Follow up in one month with Dr. Tonia Brooms or Florentina Addison Matty Deamer,NP. If symptoms do not improve or worsen, please contact office for sooner follow up or seek emergency care.

## 2022-04-19 NOTE — Progress Notes (Signed)
@Patient  ID: Deborah Mcmahon, female    DOB: 04-30-1961, 61 y.o.   MRN: [id number]  Chief Complaint  Patient presents with   Follow-up    Follow up. Patient says she still short of breath.     Referring provider: Arthur Holms, NP  HPI: 61 year old female, active smoker followed for COPD.  She is a patient Dr. Agustina Caroli and last seen in office on 04/03/2022.  She is also followed by lung cancer screening program.  Past medical history significant for hypertension, history of CVA, CKD stage III, chronic back pain.  TEST/EVENTS:  04/04/2022 PFTs: FVC 82, FEV1 83, ratio 79, TLC 82, DLCOcor 62.  Mild to moderate diffusion defect  04/03/2022: OV with Dr. Valeta Harms.  Significant smoking history and progressive DOE; suspected to be COPD but waiting on formal PFTs.  Reported that over the last few days she has had increased shortness of breath and cough with sputum production.  Treated with prednisone taper and doxycycline course.  Advised to use albuterol as needed.  If symptoms persist, advised obtaining CXR for further evaluation.  04/19/2022: Today -follow-up Patient presents today for follow-up after undergoing pulmonary function testing a few weeks ago.  Her pulmonary function testing showed normal spirometry and lung volumes.  She had no significant bronchodilator response.  She did have a mild to moderate diffusion defect.  Today, she reports that her breathing is relatively unchanged.  Continues to have shortness of breath upon exertion.  Cough has improved since her last visit but she does have an occasional cough with clear sputum.  Notices some occasional wheezing.  She did feel better when she was on the prednisone taper.  She is also responsive to albuterol.  Does note some allergy type symptoms including nasal congestion and postnasal drip.  She denies any hemoptysis, night sweats, fevers, anorexia, weight loss, lower extremity edema, palpitations.  No Known Allergies   There is no immunization  history on file for this patient.  Past Medical History:  Diagnosis Date   Carpal tunnel syndrome    bilateral   Chronic back pain    spondylolisthesis   Constipation    takes an OTC stool softener daily as needed   Gallstones    History of migraine    Hypertension    Nocturia    Weakness    tingling     Tobacco History: Social History   Tobacco Use  Smoking Status Every Day   Packs/day: 0.50   Years: 41.00   Pack years: 20.50   Types: Cigarettes  Smokeless Tobacco Never  Tobacco Comments   6 - 7 cigarettes smoked daily ARJ 02/21/22   Ready to quit: Not Answered Counseling given: Not Answered Tobacco comments: 6 - 7 cigarettes smoked daily ARJ 02/21/22   Outpatient Medications Prior to Visit  Medication Sig Dispense Refill   acetaminophen (TYLENOL) 500 MG tablet Take 500 mg by mouth every 6 (six) hours as needed for moderate pain or headache.     albuterol (VENTOLIN HFA) 108 (90 Base) MCG/ACT inhaler Inhale 2 puffs into the lungs every 6 (six) hours as needed for wheezing or shortness of breath.     allopurinol (ZYLOPRIM) 100 MG tablet Take 100 mg by mouth daily.     amLODipine (NORVASC) 10 MG tablet Take 1 tablet (10 mg total) by mouth daily. 30 tablet 3   atorvastatin (LIPITOR) 40 MG tablet Take 1 tablet (40 mg total) by mouth daily. 90 tablet 3   buPROPion (WELLBUTRIN XL) 150  MG 24 hr tablet Take 150 mg by mouth every morning.     clopidogrel (PLAVIX) 75 MG tablet Take 75 mg by mouth daily.     doxycycline (VIBRA-TABS) 100 MG tablet Take 1 tablet (100 mg total) by mouth 2 (two) times daily. 14 tablet 0   hydrALAZINE (APRESOLINE) 100 MG tablet Take 100 mg by mouth 2 (two) times daily.     methocarbamol (ROBAXIN) 750 MG tablet Take 750 mg by mouth 2 (two) times daily as needed for muscle spasms.     metoprolol succinate (TOPROL-XL) 25 MG 24 hr tablet Take 25 mg by mouth daily.     omega-3 acid ethyl esters (LOVAZA) 1 g capsule Take 1 capsule by mouth daily.      pantoprazole (PROTONIX) 40 MG tablet Take 40 mg by mouth daily.     predniSONE (DELTASONE) 10 MG tablet Take 4 tabs by mouth once daily x4 days, then 3 tabs x4 days, 2 tabs x4 days, 1 tab x4 days and stop. 40 tablet 0   Prenatal Vit-Fe Fumarate-FA (M-NATAL PLUS) 27-1 MG TABS Take 1 tablet by mouth every morning.     sucralfate (CARAFATE) 1 g tablet Take 1 g by mouth 4 (four) times daily.     Vitamin D, Ergocalciferol, (DRISDOL) 1.25 MG (50000 UNIT) CAPS capsule Take 50,000 Units by mouth every 14 (fourteen) days.     No facility-administered medications prior to visit.     Review of Systems:   Constitutional: No weight loss or gain, night sweats, fevers, chills, fatigue, or lassitude. HEENT: No headaches, difficulty swallowing, tooth/dental problems, or sore throat. No sneezing, itching, ear ache +nasal congestion/drainage CV:  No chest pain, orthopnea, PND, swelling in lower extremities, anasarca, dizziness, palpitations, syncope Resp: +shortness of breath with exertion; minimal cough; occasional wheeze. No excess mucus or change in color of mucus. No hemoptysis. No chest wall deformity GI:  No heartburn, indigestion, abdominal pain, nausea, vomiting, diarrhea, change in bowel habits, loss of appetite, bloody stools.  Skin: No rash, lesions, ulcerations MSK:  No joint pain or swelling.  No decreased range of motion.  No back pain. Neuro: No dizziness or lightheadedness.  Psych: No depression or anxiety. Mood stable.     Physical Exam:  BP 138/66 (BP Location: Right Arm, Patient Position: Sitting, Cuff Size: Large)   Pulse 85   Temp 98.1 F (36.7 C) (Oral)   Ht 5\' 4"  (1.626 m)   Wt 218 lb 9.6 oz (99.2 kg)   SpO2 96%   BMI 37.52 kg/m   GEN: Pleasant, interactive, well-appearing; obese; in no acute distress. HEENT:  Normocephalic and atraumatic. PERRLA. Sclera white. Nasal turbinates pink, moist and patent bilaterally. No rhinorrhea present. Oropharynx pink and moist, without  exudate or edema. No lesions, ulcerations, or postnasal drip.  NECK:  Supple w/ fair ROM. No JVD present. Normal carotid impulses w/o bruits. Thyroid symmetrical with no goiter or nodules palpated. No lymphadenopathy.   CV: RRR, no m/r/g, no peripheral edema. Pulses intact, +2 bilaterally. No cyanosis, pallor or clubbing. PULMONARY:  Unlabored, regular breathing. Minimal end expiratory wheeze bilaterally A&P. No accessory muscle use. No dullness to percussion. GI: BS present and normoactive. Soft, non-tender to palpation. No organomegaly or masses detected. No CVA tenderness. MSK: No erythema, warmth or tenderness. Cap refil <2 sec all extrem. No deformities or joint swelling noted.  Neuro: A/Ox3. No focal deficits noted.   Skin: Warm, no lesions or rashe Psych: Normal affect and behavior. Judgement and thought content  appropriate.     Lab Results:  CBC    Component Value Date/Time   WBC 8.0 03/30/2022 1400   RBC 3.79 (L) 03/30/2022 1400   HGB 11.5 (L) 03/30/2022 1400   HCT 35.1 (L) 03/30/2022 1400   PLT 204 03/30/2022 1400   MCV 92.6 03/30/2022 1400   MCH 30.3 03/30/2022 1400   MCHC 32.8 03/30/2022 1400   RDW 16.2 (H) 03/30/2022 1400   LYMPHSABS 3.0 11/17/2020 0237   MONOABS 0.9 11/17/2020 0237   EOSABS 0.1 11/17/2020 0237   BASOSABS 0.0 11/17/2020 0237    BMET    Component Value Date/Time   NA 141 03/30/2022 1400   K 4.0 03/30/2022 1400   CL 113 (H) 03/30/2022 1400   CO2 22 03/30/2022 1400   GLUCOSE 107 (H) 03/30/2022 1400   BUN 30 (H) 03/30/2022 1400   CREATININE 1.25 (H) 03/30/2022 1400   CALCIUM 8.9 03/30/2022 1400   GFRNONAA 49 (L) 03/30/2022 1400   GFRAA 64 (L) 02/17/2015 0837    BNP No results found for: BNP   Imaging:  DG Chest 2 View  Result Date: 04/19/2022 CLINICAL DATA:  Shortness of breath EXAM: CHEST - 2 VIEW COMPARISON:  03/30/2022 FINDINGS: Lungs are clear.  No pleural effusion or pneumothorax. The heart is normal in size. Cervical spine  fixation hardware. Degenerative changes of the visualized thoracolumbar spine. IMPRESSION: Normal chest radiographs. Electronically Signed   By: Julian Hy M.D.   On: 04/19/2022 17:21   DG Chest Port 1 View  Result Date: 03/30/2022 CLINICAL DATA:  Shortness of breath and chest pain for 3 days. Abnormal EKG. EXAM: PORTABLE CHEST 1 VIEW COMPARISON:  02/21/2022 FINDINGS: The heart size and mediastinal contours are within normal limits. Both lungs are clear. IMPRESSION: No active disease. Electronically Signed   By: Marlaine Hind M.D.   On: 03/30/2022 14:10         Latest Ref Rng & Units 04/04/2022    3:59 PM  PFT Results  FVC-Pre L 2.04  P  FVC-Predicted Pre % 78  P  FVC-Post L 2.14  P  FVC-Predicted Post % 82  P  Pre FEV1/FVC % % 81  P  Post FEV1/FCV % % 79  P  FEV1-Pre L 1.64  P  FEV1-Predicted Pre % 80  P  FEV1-Post L 1.70  P  DLCO uncorrected ml/min/mmHg 11.78  P  DLCO UNC% % 58  P  DLCO corrected ml/min/mmHg 12.58  P  DLCO COR %Predicted % 62  P  DLVA Predicted % 96  P  TLC L 4.17  P  TLC % Predicted % 82  P  RV % Predicted % 77  P    P Preliminary result    No results found for: NITRICOXIDE      Assessment & Plan:   DOE (dyspnea on exertion) Pulmonary function testing showed normal spirometry; did have a mild to moderate diffusion defect.  No formal diagnosis of COPD based on testing.  She is prednisone responsive and does have good response to albuterol as well she also has wheezing and some allergy type symptoms.  Advised that we could try her on an ICS/LABA to see if this helps with her DOE at all.  CXR today was clear. Former smoker. No other significant environmental or occupational exposures, or symptoms of connective tissue disease.  If no improvement, she will need further work-up with CT of the chest and cardiac work-up.  Patient Instructions  Trial Breo inhaler 1  puff daily. Brush tongue and rinse mouth afterwards.  Continue Albuterol inhaler 2 puffs or  3 mL neb every 6 hours as needed for shortness of breath or wheezing. Notify if symptoms persist despite rescue inhaler/neb use. Continue protonix 40 mg daily for reflux   Flonase nasal spray 2 sprays each nostril daily for nasal congestion/allergies Claritin 10 mg daily for allergies   Chest x ray today. We will notify you of any abnormal results  Follow up in one month with Dr. Valeta Harms or Joellen Jersey Kyera Felan,NP. If symptoms do not improve or worsen, please contact office for sooner follow up or seek emergency care.    Allergic rhinitis Advised that she start Flonase for postnasal drainage control as this could be contributing to her cough and Claritin for trigger prevention.   I spent 35 minutes of dedicated to the care of this patient on the date of this encounter to include pre-visit review of records, face-to-face time with the patient discussing conditions above, post visit ordering of testing, clinical documentation with the electronic health record, making appropriate referrals as documented, and communicating necessary findings to members of the patients care team.  Clayton Bibles, NP 04/19/2022  Pt aware and understands NP's role.

## 2022-04-19 NOTE — Assessment & Plan Note (Signed)
Advised that she start Flonase for postnasal drainage control as this could be contributing to her cough and Claritin for trigger prevention.

## 2022-05-08 NOTE — Progress Notes (Signed)
Cardiology Office Note:    Date:  05/09/2022   ID:  Deborah Mcmahon, DOB Dec 18, 1960, MRN [id number]  PCP:  Arthur Holms, NP   [facility] Providers Cardiologist:  None {  Referring MD: Evlyn Courier, PA-C     History of Present Illness:    Deborah Mcmahon is a 61 y.o. female with a hx of CVA, HTN, cocaine use and tobacco use, CKD IIIA and bilateral carpal tunnel syndrome who was referred by Evlyn Courier, PA-C for further management of chest pain.  Patient was seen in [facility] ER for abnormal ECG that was noted at her PCP office. She also was having neck and shoulder pain and SOB at that time. There trop negative x2, ECG with NSR, anterior q-waves. CXR unremarkable. She was recommended for CV follow-up.  Notably TTE in 10/2020 that was obtained due to CVA in the setting of severe HTN with LVEF 70-75%, sebere LVH, G1DD, normal RV, no significant valve disease.  Today, the patient states she is not feeling well. She has been feeling more SOB with exertion. She is not able to walk 1 block due to dyspnea. No associated lightheadedness, dizziness, palpitations or chest pain. She has also been experiencing LE edema that has been ongoing for about 1 week. Has been requiring 3 pillows to sleep on but this is not new. If she is to remove a pillow, she feels more SOB. Has occasional PND.  Also reports occasional pain with walking in her RLE. Has had knee surgery on that side and she is wondering if it related to that.   Blood pressure has been running mainly 130s at home. Has been compliant with all medications. Has cut back on smoking and is down 1-2 cigarettes per day. Smoked since she was 61 years old. Follows with Pulm. PFTs without evidence of COPD.  No known family history of CAD or HF.  Past Medical History:  Diagnosis Date   Carpal tunnel syndrome    bilateral   Chronic back pain    spondylolisthesis   Constipation    takes an OTC stool softener daily as needed   Gallstones    History of  migraine    Hypertension    Nocturia    Weakness    tingling     Past Surgical History:  Procedure Laterality Date   BACK SURGERY     fusion   BIOPSY  12/31/2020   Procedure: BIOPSY;  Surgeon: Irving Copas., MD;  Location: [facility] ENDOSCOPY;  Service: Gastroenterology;;   CERVICAL SPINE SURGERY     x 4-fusion   COLONOSCOPY WITH PROPOFOL N/A 12/31/2020   Procedure: COLONOSCOPY WITH PROPOFOL;  Surgeon: Irving Copas., MD;  Location: [facility] ENDOSCOPY;  Service: Gastroenterology;  Laterality: N/A;   ESOPHAGOGASTRODUODENOSCOPY (EGD) WITH PROPOFOL N/A 12/31/2020   Procedure: ESOPHAGOGASTRODUODENOSCOPY (EGD) WITH PROPOFOL;  Surgeon: Rush Landmark Telford Nab., MD;  Location: [place];  Service: Gastroenterology;  Laterality: N/A;   MYRINGOTOMY     POLYPECTOMY  12/31/2020   Procedure: POLYPECTOMY;  Surgeon: Mansouraty, Telford Nab., MD;  Location: [place];  Service: Gastroenterology;;   right knee surgery     screws   TONSILLECTOMY AND ADENOIDECTOMY     TUBAL LIGATION      Current Medications: Current Meds  Medication Sig   acetaminophen (TYLENOL) 500 MG tablet Take 500 mg by mouth every 6 (six) hours as needed for moderate pain or headache.   albuterol (VENTOLIN HFA) 108 (90 Base) MCG/ACT inhaler Inhale 2 puffs into  the lungs every 6 (six) hours as needed for wheezing or shortness of breath.   allopurinol (ZYLOPRIM) 100 MG tablet Take 100 mg by mouth daily.   amLODipine (NORVASC) 10 MG tablet Take 1 tablet (10 mg total) by mouth daily.   carvedilol (COREG) 6.25 MG tablet Take 1 tablet (6.25 mg total) by mouth 2 (two) times daily.   clopidogrel (PLAVIX) 75 MG tablet Take 75 mg by mouth daily.   fluticasone furoate-vilanterol (BREO ELLIPTA) 100-25 MCG/ACT AEPB Inhale 1 puff into the lungs daily.   furosemide (LASIX) 40 MG tablet Take 1 tablet (40 mg total) by mouth twice daily for 3 days only, then decrease to taking 1 tablet (40 mg total) by mouth daily thereafter.   hydrALAZINE  (APRESOLINE) 100 MG tablet Take 100 mg by mouth 2 (two) times daily.   methocarbamol (ROBAXIN) 750 MG tablet Take 750 mg by mouth 2 (two) times daily as needed for muscle spasms.   pantoprazole (PROTONIX) 40 MG tablet Take 40 mg by mouth daily.   potassium chloride (KLOR-CON M) 10 MEQ tablet Take 1 tablet (10 mEq total) by mouth twice daily for 3 days only, then decrease to taking 1 tablet (10 mEq total) by mouth daily thereafter.   Prenatal Vit-Fe Fumarate-FA (M-NATAL PLUS) 27-1 MG TABS Take 1 tablet by mouth every morning.   [DISCONTINUED] metoprolol succinate (TOPROL-XL) 25 MG 24 hr tablet Take 25 mg by mouth daily.     Allergies:   Patient has no known allergies.   Social History   Socioeconomic History   Marital status: Single    Spouse name: Not on file   Number of children: Not on file   Years of education: Not on file   Highest education level: Not on file  Occupational History   Not on file  Tobacco Use   Smoking status: Every Day    Packs/day: 0.50    Years: 41.00    Total pack years: 20.50    Types: Cigarettes   Smokeless tobacco: Never   Tobacco comments:    6 - 7 cigarettes smoked daily ARJ 02/21/22  Substance and Sexual Activity   Alcohol use: No   Drug use: No   Sexual activity: Yes    Birth control/protection: Post-menopausal  Other Topics Concern   Not on file  Social History Narrative   Not on file   Social Determinants of Health   Financial Resource Strain: Not on file  Food Insecurity: Not on file  Transportation Needs: Not on file  Physical Activity: Not on file  Stress: Not on file  Social Connections: Not on file     Family History: The patient's family history is negative for Anesthesia problems.  ROS:   Please see the history of present illness.    Review of Systems  Constitutional:  Positive for malaise/fatigue.  Respiratory:  Positive for shortness of breath.   Cardiovascular:  Positive for orthopnea, claudication, leg swelling and  PND. Negative for chest pain and palpitations.  Gastrointestinal:  Negative for nausea and vomiting.  Genitourinary:  Negative for flank pain.  Musculoskeletal:  Negative for falls.  Neurological:  Negative for dizziness and loss of consciousness.  Psychiatric/Behavioral:  Positive for substance abuse.      EKGs/Labs/Other Studies Reviewed:    The following studies were reviewed today: TTE 25-Nov-2020: IMPRESSIONS     1. Left ventricular ejection fraction, by estimation, is 70 to 75%. The  left ventricle has hyperdynamic function. The left ventricle has no  regional wall motion abnormalities. There is severe left ventricular  hypertrophy. Left ventricular diastolic  parameters are consistent with Grade I diastolic dysfunction (impaired  relaxation).   2. Right ventricular systolic function is normal. The right ventricular  size is small. Tricuspid regurgitation signal is inadequate for assessing  PA pressure.   3. The mitral valve is normal in structure. No evidence of mitral valve  regurgitation.   4. The aortic valve was not well visualized. Aortic valve regurgitation  is not visualized. Mild aortic valve sclerosis is present, with no  evidence of aortic valve stenosis.   5. The inferior vena cava is normal in size with greater than 50%  respiratory variability, suggesting right atrial pressure of 3 mmHg.   EKG:  EKG 04/01/22 with NSR, anterior q waves  Recent Labs: 03/30/2022: BUN 30; Creatinine, Ser 1.25; Hemoglobin 11.5; Platelets 204; Potassium 4.0; Sodium 141  Recent Lipid Panel    Component Value Date/Time   CHOL 177 11/16/2020 0323   TRIG 119 11/16/2020 0323   HDL 34 (L) 11/16/2020 0323   CHOLHDL 5.2 11/16/2020 0323   VLDL 24 11/16/2020 0323   LDLCALC 119 (H) 11/16/2020 0323     Risk Assessment/Calculations:           Physical Exam:    VS:  BP 134/80   Pulse 75   Ht 5\' 4"  (1.626 m)   Wt 217 lb 3.2 oz (98.5 kg)   SpO2 99%   BMI 37.28 kg/m     Wt  Readings from Last 3 Encounters:  05/09/22 217 lb 3.2 oz (98.5 kg)  04/19/22 218 lb 9.6 oz (99.2 kg)  04/03/22 221 lb (100.2 kg)     GEN:  Well nourished, well developed in no acute distress HEENT: Normal NECK: No JVD; No carotid bruits CARDIAC: RRR, 2/6 systolic murmur RESPIRATORY:  Clear to auscultation without rales, wheezing or rhonchi  ABDOMEN: Soft, non-tender, non-distended MUSCULOSKELETAL:  2+ LE edema on RLE, 1+ on LLE SKIN: Warm and dry NEUROLOGIC:  Alert and oriented x 3 PSYCHIATRIC:  Normal affect   ASSESSMENT:    1. DOE (dyspnea on exertion)   2. Medication management   3. SOB (shortness of breath)   4. Precordial pain   5. Stage 3a chronic kidney disease ([place])   6. Claudication ([place])   7. Essential hypertension   8. Tobacco abuse   9. Acute diastolic heart failure ([place])   10. Severe left ventricular hypertrophy   11. History of stroke    PLAN:    In order of problems listed above:  #Dyspnea on exertion: #Chest Pain: Patient with atypical chest and shoulder pain prompting visit to the ER in 03/2021. There, trop negative and ECG with anterior q-waves but no evidence of active ischemia. Currently, denies chest pain but is having significant dyspnea on exertion with evidence of volume overload on exam. Will plan for ischemic work-up with cardiac MRI as this will also allow [name] to assess if any evidence of amyloidosis given severe LVH, bilateral carpel tunnel syndrome, and CKD. Notably, has been seen by Pulm and PFTs not suggestive of COPD. -Check cardiac stress MRI -Check TTE -Manage HF as below  #Acute Heart Failure with Preserved EF: Patient with evidence of volume overload on exam with bilateral LE edema, orthopnea and PND. Last TTE 10/2020 showed LVEF 70-75%, severe LVH, G1DD, no significant valve disease. Will check BNP today and start lasix. Plan for ischemic work-up as above and TTE. -Check TTE  -Check BNP  and BMET today -Start lasix 40mg  BID x3days and  then daily thereafter -Change metop to coreg 6.25mg  BID for better BP control -Continue hydralazine 100mg  BID -Can optimize meds further going forward -Low Na diet -Daily weights  #Severe LVH with history of CKD and bilateral carpal tunnel syndrome: Patient with severe LVH on TTE. Given history of bilateral carpal tunnel syndrome and CKD, will check CMR to ensure no evidence of amyloidosis. -Check CMR as above  #Concern for RLE Claudication: Has a throbbing sensation in her right calf with ambulation. Has history of right knee surgery but symptoms are worsening. Will check ABI to ensure no significant PAD. -Check ABI  #History of Cerebellar Stroke: Occurred in 10/2020 in the setting of severe HTN. CT with left cerebellar infarct. Was planned for DAPT x3 months and then ASA monotherapy. She is currently still taking plavix. -Continue lipitor 40mg  daily -Stop plavix and continue ASA 81mg  daily  #HTN: Poorly controllled. -Continue amlodipine 10mg  daily -Change metop to coreg 6.25mg  BID -Continue hydralazine 100mg  BID  #CKD IIIA: Likely due to severe HTN. -Check BMET today and next week after diuresis  #HLD: -Continue lipitor 40mg  daily  #Tobacco Abuse: -Encourage cessation -Has significantly cut back        Medication Adjustments/Labs and Tests Ordered: Current medicines are reviewed at length with the patient today.  Concerns regarding medicines are outlined above.  Orders Placed This Encounter  Procedures   MR CARDIAC STRESS TEST   Hemoglobin and hematocrit, blood   Basic metabolic panel   Pro b natriuretic peptide   Basic metabolic panel   ECHOCARDIOGRAM COMPLETE   VAS [name] LOWER EXTREMITY ARTERIAL DUPLEX   VAS [name] ABI WITH/WO TBI   Meds ordered this encounter  Medications   carvedilol (COREG) 6.25 MG tablet    Sig: Take 1 tablet (6.25 mg total) by mouth 2 (two) times daily.    Dispense:  180 tablet    Refill:  3   furosemide (LASIX) 40 MG tablet    Sig: Take  1 tablet (40 mg total) by mouth twice daily for 3 days only, then decrease to taking 1 tablet (40 mg total) by mouth daily thereafter.    Dispense:  36 tablet    Refill:  0   potassium chloride (KLOR-CON M) 10 MEQ tablet    Sig: Take 1 tablet (10 mEq total) by mouth twice daily for 3 days only, then decrease to taking 1 tablet (10 mEq total) by mouth daily thereafter.    Dispense:  36 tablet    Refill:  0    To use in concurrence with lasix.    Patient Instructions  Medication Instructions:   STOP TAKING METOPROLOL SUCCINATE NOW  START TAKING CARVEDILOL (COREG) 6.25 MG BY MOUTH TWICE DAILY  START TAKING FUROSEMIDE (LASIX) 40 MG BY MOUTH TWICE DAILY FOR 3 DAYS ONLY, THEN DECREASE TO TAKING 40 MG BY MOUTH DAILY THEREAFTER  START TAKING POTASSIUM CHLORIDE 10 mEq BY MOUTH TWICE DAILY FOR 3 DAYS ONLY, THEN DECREASE TO TAKING 10 mEq BY MOUTH DAILY THEREAFTER.  TAKE IN CONCURRENCE WITH LASIX.  *If you need a refill on your cardiac medications before your next appointment, please call your pharmacy*   Lab Work:  1.) TODAY--BMET, PRO-BNP, HEMOGLOBIN AND HEMATOCRIT   2.)  IN ONE WEEK HERE IN THE OFFICE--BMET  If you have labs (blood work) drawn today and your tests are completely normal, you will receive your results only by: [place] (if you have MyChart) OR  A paper copy in the mail If you have any lab test that is abnormal or we need to change your treatment, we will call you to review the results.   Testing/Procedures:  Your physician has requested that you have an echocardiogram. Echocardiography is a painless test that uses sound waves to create images of your heart. It provides your doctor with information about the size and shape of your heart and how well your heart's chambers and valves are working. This procedure takes approximately one hour. There are no restrictions for this procedure.  Your physician has requested that you have a lower extremity arterial duplex. This  test is an ultrasound of the arteries in the legs or arms. It looks at arterial blood flow in the legs and arms. Allow one hour for Lower and Upper Arterial scans. There are no restrictions or special instructions  Your physician has requested that you have an ankle brachial index (ABI). During this test an ultrasound and blood pressure cuff are used to evaluate the arteries that supply the arms and legs with blood. Allow thirty minutes for this exam. There are no restrictions or special instructions.    CARDIAC STRESS MRI  You are scheduled for Cardiac MRI on ______________. Please arrive for your appointment at ______________ ( arrive 30-45 minutes prior to test start time). ?  [facility] [address] [place], [place] [address] [phone] Please take advantage of the free valet parking available at the MAIN entrance (A entrance).  Proceed to the [facility] Radiology Department (First Floor) for check-in.   OR   [facility] [address] [place], [place] [address] [phone] Please take advantage of the free valet parking available at the MAIN entrance. Proceed to [facility] registration for check-in (first floor).  Magnetic resonance imaging (MRI) is a painless test that produces images of the inside of the body without using Xrays.  During an MRI, strong magnets and radio waves work together in a Data processing managermagnetic field to form detailed images.   MRI images may provide more details about a medical condition than X-rays, CT scans, and ultrasounds can provide.  You may be given earphones to listen for instructions.  You may eat a light breakfast and take medications as ordered with the exception of HCTZ (fluid pill, other). Please avoid stimulants for 12 hr prior to test. (Ie. Caffeine, nicotine, chocolate, or antihistamine medications)  If a contrast material will be used, an IV will be inserted into one of your veins. Contrast material will be  injected into your IV. It will leave your body through your urine within a day. You may be told to drink plenty of fluids to help flush the contrast material out of your system.  You will be asked to remove all metal, including: Watch, jewelry, and other metal objects including hearing aids, hair pieces and dentures. Also wearable glucose monitoring systems (ie. Freestyle Libre and Omnipods) (Braces and fillings normally are not a problem.)   TEST WILL TAKE APPROXIMATELY 1 HOUR  PLEASE NOTIFY SCHEDULING AT LEAST 24 HOURS IN ADVANCE IF YOU ARE UNABLE TO KEEP YOUR APPOINTMENT. [phone]  Please call Rockwell AlexandriaSara Wallace, cardiac imaging nurse navigator with any questions/concerns. Rockwell AlexandriaSara Wallace RN Navigator Cardiac Imaging Larey BrickMerle Prescott RN Navigator Cardiac Imaging [facility] [phone] Office     Follow-Up:  6-8 WEEKS WITH AN EXTENDER IN THE OFFICE            Signed, Kathlynn GrateHeather E  Johney Frame, MD  05/09/2022 12:34 PM    [place]

## 2022-05-09 ENCOUNTER — Ambulatory Visit (INDEPENDENT_AMBULATORY_CARE_PROVIDER_SITE_OTHER): Payer: Medicare Other | Admitting: Cardiology

## 2022-05-09 ENCOUNTER — Encounter: Payer: Self-pay | Admitting: Cardiology

## 2022-05-09 VITALS — BP 134/80 | HR 75 | Ht 64.0 in | Wt 217.2 lb

## 2022-05-09 DIAGNOSIS — Z720 Tobacco use: Secondary | ICD-10-CM

## 2022-05-09 DIAGNOSIS — Z79899 Other long term (current) drug therapy: Secondary | ICD-10-CM

## 2022-05-09 DIAGNOSIS — R0602 Shortness of breath: Secondary | ICD-10-CM

## 2022-05-09 DIAGNOSIS — I517 Cardiomegaly: Secondary | ICD-10-CM

## 2022-05-09 DIAGNOSIS — R0609 Other forms of dyspnea: Secondary | ICD-10-CM

## 2022-05-09 DIAGNOSIS — R072 Precordial pain: Secondary | ICD-10-CM | POA: Diagnosis not present

## 2022-05-09 DIAGNOSIS — I739 Peripheral vascular disease, unspecified: Secondary | ICD-10-CM

## 2022-05-09 DIAGNOSIS — I10 Essential (primary) hypertension: Secondary | ICD-10-CM

## 2022-05-09 DIAGNOSIS — I5031 Acute diastolic (congestive) heart failure: Secondary | ICD-10-CM

## 2022-05-09 DIAGNOSIS — N1831 Chronic kidney disease, stage 3a: Secondary | ICD-10-CM

## 2022-05-09 DIAGNOSIS — Z8673 Personal history of transient ischemic attack (TIA), and cerebral infarction without residual deficits: Secondary | ICD-10-CM

## 2022-05-09 MED ORDER — CARVEDILOL 6.25 MG PO TABS: 6.2500 mg | ORAL_TABLET | Freq: Two times a day (BID) | ORAL | 3 refills | Status: DC

## 2022-05-09 MED ORDER — POTASSIUM CHLORIDE CRYS ER 10 MEQ PO TBCR: EXTENDED_RELEASE_TABLET | ORAL | 0 refills | Status: DC

## 2022-05-09 MED ORDER — FUROSEMIDE 40 MG PO TABS: ORAL_TABLET | ORAL | 0 refills | Status: DC

## 2022-05-09 NOTE — Patient Instructions (Signed)
Medication Instructions:   STOP TAKING METOPROLOL SUCCINATE NOW  START TAKING CARVEDILOL (COREG) 6.25 MG BY MOUTH TWICE DAILY  START TAKING FUROSEMIDE (LASIX) 40 MG BY MOUTH TWICE DAILY FOR 3 DAYS ONLY, THEN DECREASE TO TAKING 40 MG BY MOUTH DAILY THEREAFTER  START TAKING POTASSIUM CHLORIDE 10 mEq BY MOUTH TWICE DAILY FOR 3 DAYS ONLY, THEN DECREASE TO TAKING 10 mEq BY MOUTH DAILY THEREAFTER.  TAKE IN CONCURRENCE WITH LASIX.  *If you need a refill on your cardiac medications before your next appointment, please call your pharmacy*   Lab Work:  1.) TODAY--BMET, PRO-BNP, HEMOGLOBIN AND HEMATOCRIT   2.)  IN ONE WEEK HERE IN THE OFFICE--BMET  If you have labs (blood work) drawn today and your tests are completely normal, you will receive your results only by: MyChart Message (if you have MyChart) OR A paper copy in the mail If you have any lab test that is abnormal or we need to change your treatment, we will call you to review the results.   Testing/Procedures:  Your physician has requested that you have an echocardiogram. Echocardiography is a painless test that uses sound waves to create images of your heart. It provides your doctor with information about the size and shape of your heart and how well your heart's chambers and valves are working. This procedure takes approximately one hour. There are no restrictions for this procedure.  Your physician has requested that you have a lower extremity arterial duplex. This test is an ultrasound of the arteries in the legs or arms. It looks at arterial blood flow in the legs and arms. Allow one hour for Lower and Upper Arterial scans. There are no restrictions or special instructions  Your physician has requested that you have an ankle brachial index (ABI). During this test an ultrasound and blood pressure cuff are used to evaluate the arteries that supply the arms and legs with blood. Allow thirty minutes for this exam. There are no  restrictions or special instructions.    CARDIAC STRESS MRI  You are scheduled for Cardiac MRI on ______________. Please arrive for your appointment at ______________ ( arrive 30-45 minutes prior to test start time). ?  [facility] [address] [place], [place] [address] [phone] Please take advantage of the free valet parking available at the MAIN entrance (A entrance).  Proceed to the [facility] Radiology Department (First Floor) for check-in.   OR   [facility] [address] [place], [place] [address] [phone] Please take advantage of the free valet parking available at the MAIN entrance. Proceed to [facility] registration for check-in (first floor).  Magnetic resonance imaging (MRI) is a painless test that produces images of the inside of the body without using Xrays.  During an MRI, strong magnets and radio waves work together in a Data processing manager to form detailed images.   MRI images may provide more details about a medical condition than X-rays, CT scans, and ultrasounds can provide.  You may be given earphones to listen for instructions.  You may eat a light breakfast and take medications as ordered with the exception of HCTZ (fluid pill, other). Please avoid stimulants for 12 hr prior to test. (Ie. Caffeine, nicotine, chocolate, or antihistamine medications)  If a contrast material will be used, an IV will be inserted into one of your veins. Contrast material will be injected into your IV. It will leave your body through your urine within a day. You may be  told to drink plenty of fluids to help flush the contrast material out of your system.  You will be asked to remove all metal, including: Watch, jewelry, and other metal objects including hearing aids, hair pieces and dentures. Also wearable glucose monitoring systems (ie. Freestyle Libre and Omnipods) (Braces and fillings normally are not a problem.)   TEST WILL TAKE  APPROXIMATELY 1 HOUR  PLEASE NOTIFY SCHEDULING AT LEAST 24 HOURS IN ADVANCE IF YOU ARE UNABLE TO KEEP YOUR APPOINTMENT. [phone]  Please call Rockwell Alexandria, cardiac imaging nurse navigator with any questions/concerns. Rockwell Alexandria RN Navigator Cardiac Imaging Larey Brick RN Navigator Cardiac Imaging [facility] Heart and Vascular Services [phone] Office     Follow-Up:  6-8 WEEKS WITH AN EXTENDER IN THE OFFICE

## 2022-05-10 LAB — HEMOGLOBIN AND HEMATOCRIT, BLOOD
Hematocrit: 37.1 % (ref 34.0–46.6)
Hemoglobin: 12.3 g/dL (ref 11.1–15.9)

## 2022-05-10 LAB — BASIC METABOLIC PANEL
BUN/Creatinine Ratio: 22 (ref 12–28)
BUN: 28 mg/dL — ABNORMAL HIGH (ref 8–27)
CO2: 25 mmol/L (ref 20–29)
Calcium: 9.9 mg/dL (ref 8.7–10.3)
Chloride: 109 mmol/L — ABNORMAL HIGH (ref 96–106)
Creatinine, Ser: 1.29 mg/dL — ABNORMAL HIGH (ref 0.57–1.00)
Glucose: 94 mg/dL (ref 70–99)
Potassium: 5.3 mmol/L — ABNORMAL HIGH (ref 3.5–5.2)
Sodium: 144 mmol/L (ref 134–144)
eGFR: 47 mL/min/{1.73_m2} — ABNORMAL LOW (ref >59)

## 2022-05-10 LAB — PRO B NATRIURETIC PEPTIDE: NT-Pro BNP: 702 pg/mL — ABNORMAL HIGH (ref 0–287)

## 2022-05-13 ENCOUNTER — Other Ambulatory Visit (HOSPITAL_COMMUNITY): Payer: Self-pay | Admitting: Emergency Medicine

## 2022-05-13 ENCOUNTER — Other Ambulatory Visit (HOSPITAL_COMMUNITY): Payer: Self-pay | Admitting: Cardiology

## 2022-05-13 ENCOUNTER — Other Ambulatory Visit (HOSPITAL_COMMUNITY): Payer: Medicare Other

## 2022-05-13 ENCOUNTER — Telehealth: Payer: Self-pay | Admitting: *Deleted

## 2022-05-13 DIAGNOSIS — R072 Precordial pain: Secondary | ICD-10-CM

## 2022-05-13 NOTE — Telephone Encounter (Signed)
-----   Message from Meriam Sprague, MD sent at 05/10/2022  6:36 AM EDT ----- Her labs show that her fluid levels are elevated. Her kidney function is stable. Her potassium is borderline high. Because we are giving her lasix, this potassium level should drop. Can we decrease her supplementation to daily with the lasix BID dosing (so 3 days total) and no potassium with the 40mg  lasix daily dosing until we get her follow-up labs next week?

## 2022-05-13 NOTE — Telephone Encounter (Signed)
The patient has been notified of the result and verbalized understanding.  All questions (if any) were answered.  Pt aware to stop taking KDUR now and make sure she comes in for her scheduled BMET on this Thursday 6/22.  Pt states she started taking KDUR on last Friday and will STOP now and remain off of this, unless we otherwise advise based on how her 6/22 lab results come back. Will remove KDUR from her med list for now. Did inform her that she should continue taking her lasix and should now be taking lasix 40 mg po daily.  She confirmed this is correct and that is how she is taking it now.  Pt verbalized understanding and agrees with this plan.

## 2022-05-16 ENCOUNTER — Other Ambulatory Visit: Payer: Medicare Other

## 2022-05-16 DIAGNOSIS — R072 Precordial pain: Secondary | ICD-10-CM

## 2022-05-16 DIAGNOSIS — Z79899 Other long term (current) drug therapy: Secondary | ICD-10-CM

## 2022-05-16 DIAGNOSIS — N1831 Chronic kidney disease, stage 3a: Secondary | ICD-10-CM

## 2022-05-16 DIAGNOSIS — R0602 Shortness of breath: Secondary | ICD-10-CM

## 2022-05-16 LAB — BASIC METABOLIC PANEL
BUN/Creatinine Ratio: 12 (ref 12–28)
BUN: 19 mg/dL (ref 8–27)
CO2: 25 mmol/L (ref 20–29)
Calcium: 9.4 mg/dL (ref 8.7–10.3)
Chloride: 102 mmol/L (ref 96–106)
Creatinine, Ser: 1.55 mg/dL — ABNORMAL HIGH (ref 0.57–1.00)
Glucose: 144 mg/dL — ABNORMAL HIGH (ref 70–99)
Potassium: 4.8 mmol/L (ref 3.5–5.2)
Sodium: 139 mmol/L (ref 134–144)
eGFR: 38 mL/min/{1.73_m2} — ABNORMAL LOW (ref >59)

## 2022-05-17 ENCOUNTER — Telehealth: Payer: Self-pay | Admitting: *Deleted

## 2022-05-17 DIAGNOSIS — R7989 Other specified abnormal findings of blood chemistry: Secondary | ICD-10-CM

## 2022-05-17 DIAGNOSIS — Z79899 Other long term (current) drug therapy: Secondary | ICD-10-CM

## 2022-05-17 DIAGNOSIS — I517 Cardiomegaly: Secondary | ICD-10-CM

## 2022-05-17 DIAGNOSIS — N1831 Chronic kidney disease, stage 3a: Secondary | ICD-10-CM

## 2022-05-17 MED ORDER — FUROSEMIDE 20 MG PO TABS: 20.0000 mg | ORAL_TABLET | Freq: Every day | ORAL | 1 refills | Status: DC

## 2022-05-21 ENCOUNTER — Telehealth (HOSPITAL_COMMUNITY): Payer: Self-pay | Admitting: Emergency Medicine

## 2022-05-21 NOTE — Telephone Encounter (Signed)
Unable to leave vm Thoren Hosang RN Navigator Cardiac Imaging [facility] [phone] Office  [phone] Cell  

## 2022-05-22 ENCOUNTER — Ambulatory Visit (HOSPITAL_COMMUNITY)
Admission: RE | Admit: 2022-05-22 | Discharge: 2022-05-22 | Disposition: A | Discharge status: Home / Self Care | Payer: Medicare Other | Source: Ambulatory Visit | Attending: Cardiology | Admitting: Cardiology

## 2022-05-22 ENCOUNTER — Ambulatory Visit (HOSPITAL_BASED_OUTPATIENT_CLINIC_OR_DEPARTMENT_OTHER): Payer: Medicare Other

## 2022-05-22 ENCOUNTER — Encounter (HOSPITAL_COMMUNITY): Payer: Self-pay

## 2022-05-22 DIAGNOSIS — R0602 Shortness of breath: Secondary | ICD-10-CM | POA: Insufficient documentation

## 2022-05-22 DIAGNOSIS — N1831 Chronic kidney disease, stage 3a: Secondary | ICD-10-CM | POA: Insufficient documentation

## 2022-05-22 DIAGNOSIS — R072 Precordial pain: Secondary | ICD-10-CM | POA: Insufficient documentation

## 2022-05-22 LAB — ECHOCARDIOGRAM COMPLETE
Area-P 1/2: 3.54 cm2
S' Lateral: 2.5 cm

## 2022-05-22 MED ORDER — GADOBUTROL 1 MMOL/ML IV SOLN
10.0000 mL | Freq: Once | INTRAVENOUS | Status: AC | PRN
  Administered 2022-05-22: 10 mL via INTRAVENOUS

## 2022-05-22 MED ORDER — REGADENOSON 0.4 MG/5ML IV SOLN
INTRAVENOUS | Status: AC
  Filled 2022-05-22: qty 5

## 2022-05-22 NOTE — Progress Notes (Signed)
     Patient presents for stress MRI.  BP 178/102, HR 82.  No caffeine intake in prior 12 hours. No wheezing on exam.  EKG today shows sinus rhythm with VR 82 bpm.    Shared Decision Making/Informed Consent  The risks [chest pain, shortness of breath, cardiac arrhythmias, dizziness, blood pressure fluctuations, myocardial infarction, stroke/transient ischemic attack, nausea, vomiting, allergic reaction, and life-threatening complications (estimated to be 1 in 10,000)], benefits (risk stratification, diagnosing coronary artery disease, treatment guidance) and alternatives of a MRI stress test were discussed in detail with patient and they agree to proceed.

## 2022-05-30 ENCOUNTER — Other Ambulatory Visit: Payer: Medicare Other

## 2022-05-31 ENCOUNTER — Ambulatory Visit (HOSPITAL_COMMUNITY): Payer: Medicare Other

## 2022-06-14 ENCOUNTER — Ambulatory Visit (HOSPITAL_COMMUNITY)
Admission: RE | Admit: 2022-06-14 | Payer: Medicare Other | Source: Ambulatory Visit | Attending: Cardiology | Admitting: Cardiology

## 2022-06-23 NOTE — Progress Notes (Deleted)
Cardiology Office Note:    Date:  06/23/2022   ID:  PORFIRIA ALMER, DOB August 10, 1961, MRN [id number]  PCP:  Arthur Holms, NP   [facility] Providers Cardiologist:  None { Click to update primary MD,subspecialty MD or APP then REFRESH:1}    Referring MD: Arthur Holms, NP   Chief Complaint: ***  History of Present Illness:    Deborah Mcmahon is a *** 61 y.o. female with a hx of CVA, hypertension, cocaine abuse, tobacco abuse, CKD, modification, severe LVH, HFpEF, and hyperlipidemia.   Had TTE 10/2020 obtained due to CVA in the setting of severe HTN with LVEF 70 to 75%, severe LVH, G1 DD, normal RV, no significant valve disease.  Was referred by PCP for evaluation of pain and seen by Dr. Johney Frame on 05/09/22.  She was seen in [facility] ED 03/2022 for abnormal EKG that was noted at PCP office.  Additionally was having neck and shoulder pain and shortness of breath at that time. Troponins negative x2.  EKG with NSR, anterior Q waves. CXR unremarkable. Recommended to follow-up with cardiology. She reported feeling more SOB with exertion. Unable to walk 1 block due to dyspnea.  Also having LE edema for 1 week, requiring 3 pillows to sleep on but this is not new.  Had PFTs by pulmonology not suggestive of COPD.  Cardiac MRI 05/22/22 revealed dense area of ischemia on stress perfusion imaging, no evidence of cardiac amyloidosis, has elevated gadolinium enhancement but not in amyloid range, asymmetric LV hypertrophy felt to be secondary to hypertensive heart disease, normal LV size and function, normal RV size and function.  Echo 6/28 also showed severe LVH, normal LVEF, mild mitral valve regurgitation. BNP was elevated at 702.  Advised to stop metoprolol and start carvedilol 6.25 mg twice daily, in addition to Lasix and potassium. Creatinine bumped on higher dose of Lasix and she was advised to take 20 mg daily. Advised to follow-up in 6-8 weeks.   Today, she is here for follow-up.    Past Medical History:   Diagnosis Date   Carpal tunnel syndrome    bilateral   Chronic back pain    spondylolisthesis   Constipation    takes an OTC stool softener daily as needed   Gallstones    History of migraine    Hypertension    Nocturia    Weakness    tingling     Past Surgical History:  Procedure Laterality Date   BACK SURGERY     fusion   BIOPSY  12/31/2020   Procedure: BIOPSY;  Surgeon: Irving Copas., MD;  Location: [facility] ENDOSCOPY;  Service: Gastroenterology;;   CERVICAL SPINE SURGERY     x 4-fusion   COLONOSCOPY WITH PROPOFOL N/A 12/31/2020   Procedure: COLONOSCOPY WITH PROPOFOL;  Surgeon: Irving Copas., MD;  Location: [facility] ENDOSCOPY;  Service: Gastroenterology;  Laterality: N/A;   ESOPHAGOGASTRODUODENOSCOPY (EGD) WITH PROPOFOL N/A 12/31/2020   Procedure: ESOPHAGOGASTRODUODENOSCOPY (EGD) WITH PROPOFOL;  Surgeon: Rush Landmark Telford Nab., MD;  Location: [place];  Service: Gastroenterology;  Laterality: N/A;   MYRINGOTOMY     POLYPECTOMY  12/31/2020   Procedure: POLYPECTOMY;  Surgeon: Mansouraty, Telford Nab., MD;  Location: [facility] ENDOSCOPY;  Service: Gastroenterology;;   right knee surgery     screws   TONSILLECTOMY AND ADENOIDECTOMY     TUBAL LIGATION      Current Medications: No outpatient medications have been marked as taking for the 06/25/22 encounter (Appointment) with Ann Maki Lanice Schwab, NP.  Allergies:   Patient has no known allergies.   Social History   Socioeconomic History   Marital status: Single    Spouse name: Not on file   Number of children: Not on file   Years of education: Not on file   Highest education level: Not on file  Occupational History   Not on file  Tobacco Use   Smoking status: Every Day    Packs/day: 0.50    Years: 41.00    Total pack years: 20.50    Types: Cigarettes   Smokeless tobacco: Never   Tobacco comments:    6 - 7 cigarettes smoked daily ARJ 02/21/22  Substance and Sexual Activity   Alcohol use: No   Drug use: No    Sexual activity: Yes    Birth control/protection: Post-menopausal  Other Topics Concern   Not on file  Social History Narrative   Not on file   Social Determinants of Health   Financial Resource Strain: Not on file  Food Insecurity: Not on file  Transportation Needs: Not on file  Physical Activity: Not on file  Stress: Not on file  Social Connections: Not on file     Family History: The patient's ***family history is negative for Anesthesia problems.  ROS:   Please see the history of present illness.    *** All other systems reviewed and are negative.  Labs/Other Studies Reviewed:    The following studies were reviewed today:  Echo 05/22/22  1. There is a small dynamic mid LV cavity gradient of at rest  during systole due to hyperdynamic LVF.Marland Kitchen Left ventricular ejection  fraction, by estimation, is 70 to 75%. The left ventricle has hyperdynamic  function. The left ventricle has no  regional wall motion abnormalities. There is severe concentric left  ventricular hypertrophy. Left ventricular diastolic parameters are  consistent with Grade I diastolic dysfunction (impaired relaxation).   2. Right ventricular systolic function is normal. The right ventricular  size is normal. Tricuspid regurgitation signal is inadequate for assessing  PA pressure.   3. The mitral valve is normal in structure. Mild mitral valve  regurgitation. No evidence of mitral stenosis.   4. There is moderate focal calcification of the left coronary cusp. The  aortic valve is calcified. Aortic valve regurgitation is not visualized.  Aortic valve sclerosis/calcification is present, without any evidence of  aortic stenosis.   5. The inferior vena cava is normal in size with greater than 50%  respiratory variability, suggesting right atrial pressure of 3 mmHg.   Cardiac MRI 05/22/22  1.  No evidence of ischemia on stress perfusion imaging   2. No evidence of cardiac amyloidosis, as no late  gadolinium enhancement, and while extracellular volume is elevated (36%), it is not in amyloid range (>40%)   3. Asymmetric LV hypertrophy measuring 52mm in septum (45mm in posterior wall). While meets criteria for hypertrophic cardiomyopathy (>9mm), wall thickness cutoff of 35mm is often used for African American patients, as hypertrophy up to 41mm can be seen due to hypertension. Suspect hypertensive heart disease   4.  Normal LV size and systolic function (EF 55%)   5.  Normal RV size and systolic function (EF 51%)  Recent Labs: 03/30/2022: Platelets 204 05/09/2022: Hemoglobin 12.3; NT-Pro BNP 702 05/16/2022: BUN 19; Creatinine, Ser 1.55; Potassium 4.8; Sodium 139  Recent Lipid Panel    Component Value Date/Time   CHOL 177 11/16/2020 0323   TRIG 119 11/16/2020 0323   HDL 34 (L) 11/16/2020  0323   CHOLHDL 5.2 11/16/2020 0323   VLDL 24 11/16/2020 0323   LDLCALC 119 (H) 11/16/2020 0323     Risk Assessment/Calculations:   {Does this patient have ATRIAL FIBRILLATION?[phone]}       Physical Exam:    VS:  There were no vitals taken for this visit.    Wt Readings from Last 3 Encounters:  05/09/22 217 lb 3.2 oz (98.5 kg)  04/19/22 218 lb 9.6 oz (99.2 kg)  04/03/22 221 lb (100.2 kg)     GEN: *** Well nourished, well developed in no acute distress HEENT: Normal NECK: No JVD; No carotid bruits CARDIAC: ***RRR, no murmurs, rubs, gallops RESPIRATORY:  Clear to auscultation without rales, wheezing or rhonchi  ABDOMEN: Soft, non-tender, non-distended MUSCULOSKELETAL:  No edema; No deformity. *** pedal pulses, ***bilaterally SKIN: Warm and dry NEUROLOGIC:  Alert and oriented x 3 PSYCHIATRIC:  Normal affect   EKG:  EKG is *** ordered today.  The ekg ordered today demonstrates ***  Diagnoses:    No diagnosis found. Assessment and Plan:     Severe LVH: Seen on echo 05/22/22. BP today   Hypertension:  Dyspnea  {Are you ordering a CV Procedure (e.g. stress test,  cath, DCCV, TEE, etc)?   Press F2        :[id number]}    Medication Adjustments/Labs and Tests Ordered: Current medicines are reviewed at length with the patient today.  Concerns regarding medicines are outlined above.  No orders of the defined types were placed in this encounter.  No orders of the defined types were placed in this encounter.   There are no Patient Instructions on file for this visit.   Signed, Levi Aland, NP  06/23/2022 6:46 PM    [facility]

## 2022-06-25 ENCOUNTER — Ambulatory Visit: Payer: Medicare Other | Admitting: Nurse Practitioner

## 2022-06-25 NOTE — Progress Notes (Unsigned)
Cardiology Office Note:    Date:  06/26/2022   ID:  Deborah Mcmahon, DOB 09/24/1961, MRN [id number]  PCP:  Loura Back, NP   [facility] Providers Cardiologist:  None     Referring MD: Loura Back, NP   Chief Complaint: follow-up hypertension  History of Present Illness:    Deborah Mcmahon is a very pleasant 61 y.o. female with a hx of CVA, hypertension, cocaine abuse, tobacco abuse, CKD, modification, severe LVH, HFpEF, and hyperlipidemia.   Had TTE 10/2020 obtained due to CVA in the setting of severe HTN with LVEF 70 to 75%, severe LVH, G1 DD, normal RV, no significant valve disease.  Was referred by PCP for evaluation of pain and seen by Dr. Shari Prows on 05/09/22.  She was seen in [facility] ED 03/2022 for abnormal EKG that was noted at PCP office.  Additionally was having neck and shoulder pain and shortness of breath at that time. Troponins negative x2.  EKG with NSR, anterior Q waves. CXR unremarkable. Recommended to follow-up with cardiology. She reported feeling more SOB with exertion. Unable to walk 1 block due to dyspnea.  Also having LE edema for 1 week, requiring 3 pillows to sleep on but this is not new.  Had PFTs by pulmonology not suggestive of COPD.  Cardiac MRI 05/22/22 revealed dense area of ischemia on stress perfusion imaging, no evidence of cardiac amyloidosis, has elevated gadolinium enhancement but not in amyloid range, asymmetric LV hypertrophy felt to be secondary to hypertensive heart disease, normal LV size and function, normal RV size and function.  Echo 6/28 also showed severe LVH, normal LVEF, mild mitral valve regurgitation. BNP was elevated at 702.  Advised to stop metoprolol and start carvedilol 6.25 mg twice daily, in addition to Lasix and potassium. Creatinine bumped on higher dose of Lasix and she was advised to take 20 mg daily. Advised to follow-up in 6-8 weeks.   Today, she is here for follow-up. Reports she is having severe right knee pain, is having trouble  getting in with [facility]. Was told by [facility] nurse who came to her home yesterday and told her Lasix was not in her pill pack. Reports good urine output, was not aware that she was not taking Lasix. Highest home BP reading has been 142 mmHg. Mild lower extremity edema, she states her legs feel tight. She  denies chest pain, shortness of breath, fatigue, palpitations, melena, hematuria, hemoptysis, diaphoresis, weakness, presyncope, syncope, orthopnea, and PND. Has not taken her medications yet today.   Past Medical History:  Diagnosis Date   Carpal tunnel syndrome    bilateral   Chronic back pain    spondylolisthesis   Constipation    takes an OTC stool softener daily as needed   Gallstones    History of migraine    Hypertension    Nocturia    Weakness    tingling     Past Surgical History:  Procedure Laterality Date   BACK SURGERY     fusion   BIOPSY  12/31/2020   Procedure: BIOPSY;  Surgeon: Lemar Lofty., MD;  Location: [facility] ENDOSCOPY;  Service: Gastroenterology;;   CERVICAL SPINE SURGERY     x 4-fusion   COLONOSCOPY WITH PROPOFOL N/A 12/31/2020   Procedure: COLONOSCOPY WITH PROPOFOL;  Surgeon: Lemar Lofty., MD;  Location: [facility] ENDOSCOPY;  Service: Gastroenterology;  Laterality: N/A;   ESOPHAGOGASTRODUODENOSCOPY (EGD) WITH PROPOFOL N/A 12/31/2020   Procedure: ESOPHAGOGASTRODUODENOSCOPY (EGD) WITH PROPOFOL;  Surgeon: Lemar Lofty., MD;  Location: [facility] ENDOSCOPY;  Service: Gastroenterology;  Laterality: N/A;   MYRINGOTOMY     POLYPECTOMY  12/31/2020   Procedure: POLYPECTOMY;  Surgeon: Mansouraty, Telford Nab., MD;  Location: [place];  Service: Gastroenterology;;   right knee surgery     screws   TONSILLECTOMY AND ADENOIDECTOMY     TUBAL LIGATION      Current Medications: Current Meds  Medication Sig   acetaminophen (TYLENOL) 500 MG tablet Take 500 mg by mouth every 6 (six) hours as needed for moderate pain or headache.   albuterol (VENTOLIN HFA) 108  (90 Base) MCG/ACT inhaler Inhale 2 puffs into the lungs every 6 (six) hours as needed for wheezing or shortness of breath.   allopurinol (ZYLOPRIM) 100 MG tablet Take 100 mg by mouth daily.   amLODipine (NORVASC) 10 MG tablet Take 1 tablet (10 mg total) by mouth daily.   carvedilol (COREG) 6.25 MG tablet Take 1 tablet (6.25 mg total) by mouth 2 (two) times daily.   clopidogrel (PLAVIX) 75 MG tablet Take 75 mg by mouth daily.   fluticasone (FLONASE) 50 MCG/ACT nasal spray Place 2 sprays into both nostrils daily.   fluticasone furoate-vilanterol (BREO ELLIPTA) 100-25 MCG/ACT AEPB Inhale 1 puff into the lungs daily.   furosemide (LASIX) 20 MG tablet Take 1 tablet (20 mg total) by mouth daily.   hydrALAZINE (APRESOLINE) 100 MG tablet Take 100 mg by mouth 2 (two) times daily.   methocarbamol (ROBAXIN) 750 MG tablet Take 750 mg by mouth 2 (two) times daily as needed for muscle spasms.   metoprolol succinate (TOPROL-XL) 25 MG 24 hr tablet Take 25 mg by mouth every morning.   omega-3 acid ethyl esters (LOVAZA) 1 g capsule Take 1 capsule by mouth daily.   pantoprazole (PROTONIX) 40 MG tablet Take 40 mg by mouth daily.   potassium chloride (KLOR-CON M) 10 MEQ tablet Take 1 tablet (10 mEq total) by mouth daily.   Prenatal Vit-Fe Fumarate-FA (M-NATAL PLUS) 27-1 MG TABS Take 1 tablet by mouth every morning.   sucralfate (CARAFATE) 1 g tablet Take 1 g by mouth 4 (four) times daily.   Vitamin D, Ergocalciferol, (DRISDOL) 1.25 MG (50000 UNIT) CAPS capsule Take 50,000 Units by mouth every 14 (fourteen) days.     Allergies:   Patient has no known allergies.   Social History   Socioeconomic History   Marital status: Single    Spouse name: Not on file   Number of children: Not on file   Years of education: Not on file   Highest education level: Not on file  Occupational History   Not on file  Tobacco Use   Smoking status: Every Day    Packs/day: 0.50    Years: 41.00    Total pack years: 20.50     Types: Cigarettes   Smokeless tobacco: Never   Tobacco comments:    6 - 7 cigarettes smoked daily ARJ 02/21/22  Substance and Sexual Activity   Alcohol use: No   Drug use: No   Sexual activity: Yes    Birth control/protection: Post-menopausal  Other Topics Concern   Not on file  Social History Narrative   Not on file   Social Determinants of Health   Financial Resource Strain: Not on file  Food Insecurity: Not on file  Transportation Needs: Not on file  Physical Activity: Not on file  Stress: Not on file  Social Connections: Not on file     Family History: The patient's family history is negative  for Anesthesia problems.  ROS:   Please see the history of present illness.    + leg tightness All other systems reviewed and are negative.  Labs/Other Studies Reviewed:    The following studies were reviewed today:  Echo 05/22/22  1. There is a small dynamic mid LV cavity gradient of at rest  during systole due to hyperdynamic LVF.Marland Kitchen Left ventricular ejection  fraction, by estimation, is 70 to 75%. The left ventricle has hyperdynamic  function. The left ventricle has no  regional wall motion abnormalities. There is severe concentric left  ventricular hypertrophy. Left ventricular diastolic parameters are  consistent with Grade I diastolic dysfunction (impaired relaxation).   2. Right ventricular systolic function is normal. The right ventricular  size is normal. Tricuspid regurgitation signal is inadequate for assessing  PA pressure.   3. The mitral valve is normal in structure. Mild mitral valve  regurgitation. No evidence of mitral stenosis.   4. There is moderate focal calcification of the left coronary cusp. The  aortic valve is calcified. Aortic valve regurgitation is not visualized.  Aortic valve sclerosis/calcification is present, without any evidence of  aortic stenosis.   5. The inferior vena cava is normal in size with greater than 50%  respiratory  variability, suggesting right atrial pressure of 3 mmHg.   Cardiac MRI 05/22/22  1.  No evidence of ischemia on stress perfusion imaging   2. No evidence of cardiac amyloidosis, as no late gadolinium enhancement, and while extracellular volume is elevated (36%), it is not in amyloid range (>40%)   3. Asymmetric LV hypertrophy measuring 73mm in septum (42mm in posterior wall). While meets criteria for hypertrophic cardiomyopathy (>20mm), wall thickness cutoff of 67mm is often used for African American patients, as hypertrophy up to 52mm can be seen due to hypertension. Suspect hypertensive heart disease   4.  Normal LV size and systolic function (EF 55%)   5.  Normal RV size and systolic function (EF 51%)  Recent Labs: 03/30/2022: Platelets 204 05/09/2022: Hemoglobin 12.3; NT-Pro BNP 702 05/16/2022: BUN 19; Creatinine, Ser 1.55; Potassium 4.8; Sodium 139  Recent Lipid Panel    Component Value Date/Time   CHOL 177 11/16/2020 0323   TRIG 119 11/16/2020 0323   HDL 34 (L) 11/16/2020 0323   CHOLHDL 5.2 11/16/2020 0323   VLDL 24 11/16/2020 0323   LDLCALC 119 (H) 11/16/2020 0323     Risk Assessment/Calculations:       Physical Exam:    VS:  BP (!) 136/90   Pulse 86   Ht 5\' 4"  (1.626 m)   Wt 210 lb (95.3 kg)   SpO2 97%   BMI 36.05 kg/m     Wt Readings from Last 3 Encounters:  06/26/22 210 lb (95.3 kg)  05/09/22 217 lb 3.2 oz (98.5 kg)  04/19/22 218 lb 9.6 oz (99.2 kg)     GEN:  Well developed, obese female in no acute distress HEENT: Normal NECK: No JVD; No carotid bruits CARDIAC: RRR, no murmurs, rubs, gallops RESPIRATORY:  Clear to auscultation without rales, wheezing or rhonchi  ABDOMEN: Soft, non-tender, non-distended MUSCULOSKELETAL:  No edema; No deformity. 2+ pedal pulses, equal bilaterally SKIN: Warm and dry NEUROLOGIC:  Alert and oriented x 3 PSYCHIATRIC:  Normal affect   EKG:  EKG is not ordered today.    Diagnoses:    1. Medication management   2.  Severe left ventricular hypertrophy   3. SOB (shortness of breath)   4. Essential hypertension  5. Chronic heart failure with preserved ejection fraction (HFpEF) (HCC)    Assessment and Plan:     Severe LVH: Seen on echo 05/22/22. BP is elevated today. She reports good BP readings at home, highest SBP 142. Does not limit sodium. Is not exercising on a consistent basis. Emphasized the importance of good BP control, weight loss, regular physical activity, and low sodium diet. Need to ensure that she has Lasix at home as prescribed. Advised her to monitor home BP and report to [name] prior to next office visit if BP consistently > 140/90.  Hypertension: BP is elevated today, remains elevated on my recheck. Has not taken her medications yet today. She reports highest home reading 142 systolic. There is a question of whether she is taking Lasix. Will call her pharmacy. Advised her to continue to monitor home BP and call us if BP consistently > 140/90. Emphasized the importance of good BP control in the setting of severe LVH. Will check BMET today.   Dyspnea: She denies dyspnea, orthopnea, PND, chest pain. Activity is limited by right knee pain. Encouraged regular physical activity for benefit of weight loss and heart health.   Medical management: Will call pharmacy to ensure medications are as prescribed. We are adding potassium chloride today since she is on chronic diuretic.   Disposition: 6 months with Dr. Shari Prows     Medication Adjustments/Labs and Tests Ordered: Current medicines are reviewed at length with the patient today.  Concerns regarding medicines are outlined above.  Orders Placed This Encounter  Procedures   Basic Metabolic Panel (BMET)   Meds ordered this encounter  Medications   potassium chloride (KLOR-CON M) 10 MEQ tablet    Sig: Take 1 tablet (10 mEq total) by mouth daily.    Dispense:  30 tablet    Refill:  11    Patient Instructions  Medication Instructions:   START  K-dur one (1) tablet by mouth (10 mEq) daily.   *If you need a refill on your cardiac medications before your next appointment, please call your pharmacy*   Lab Work:  TODAY!!!!  BMET  If you have labs (blood work) drawn today and your tests are completely normal, you will receive your results only by: MyChart Message (if you have MyChart) OR A paper copy in the mail If you have any lab test that is abnormal or we need to change your treatment, we will call you to review the results.   Testing/Procedures:  None ordered.   Follow-Up: At [facility], you and your health needs are our priority.  As part of our continuing mission to provide you with exceptional heart care, we have created designated Provider Care Teams.  These Care Teams include your primary Cardiologist (physician) and Advanced Practice Providers (APPs -  Physician Assistants and Nurse Practitioners) who all work together to provide you with the care you need, when you need it.  We recommend signing up for the patient portal called "MyChart".  Sign up information is provided on this After Visit Summary.  MyChart is used to connect with patients for Virtual Visits (Telemedicine).  Patients are able to view lab/test results, encounter notes, upcoming appointments, etc.  Non-urgent messages can be sent to your provider as well.   To learn more about what you can do with MyChart, go to [email or web address].    Your next appointment:   6 month(s)  The format for your next appointment:   In Person  Provider:   Laurance Flatten, MD  If primary card or EP is not listed click here to update    :1}    Other Instructions  Your physician wants you to follow-up in: 6 months with Dr. Johney Frame.  You will receive a reminder letter in the mail two months in advance. If you don't receive a letter, please call our office to schedule the follow-up appointment.   Important Information About Sugar          Signed, Emmaline Life, NP  06/26/2022 2:46 PM    [facility]

## 2022-06-26 ENCOUNTER — Ambulatory Visit (INDEPENDENT_AMBULATORY_CARE_PROVIDER_SITE_OTHER): Payer: Medicare Other | Admitting: Nurse Practitioner

## 2022-06-26 ENCOUNTER — Encounter: Payer: Self-pay | Admitting: Nurse Practitioner

## 2022-06-26 ENCOUNTER — Telehealth: Payer: Self-pay | Admitting: *Deleted

## 2022-06-26 VITALS — BP 136/90 | HR 86 | Ht 64.0 in | Wt 210.0 lb

## 2022-06-26 DIAGNOSIS — I517 Cardiomegaly: Secondary | ICD-10-CM | POA: Diagnosis not present

## 2022-06-26 DIAGNOSIS — I10 Essential (primary) hypertension: Secondary | ICD-10-CM | POA: Diagnosis not present

## 2022-06-26 DIAGNOSIS — R0602 Shortness of breath: Secondary | ICD-10-CM

## 2022-06-26 DIAGNOSIS — G8929 Other chronic pain: Secondary | ICD-10-CM

## 2022-06-26 DIAGNOSIS — I5032 Chronic diastolic (congestive) heart failure: Secondary | ICD-10-CM

## 2022-06-26 DIAGNOSIS — Z79899 Other long term (current) drug therapy: Secondary | ICD-10-CM | POA: Diagnosis not present

## 2022-06-26 DIAGNOSIS — M25569 Pain in unspecified knee: Secondary | ICD-10-CM

## 2022-06-26 MED ORDER — POTASSIUM CHLORIDE CRYS ER 10 MEQ PO TBCR: 10.0000 meq | EXTENDED_RELEASE_TABLET | Freq: Every day | ORAL | 11 refills | Status: DC

## 2022-06-26 NOTE — Patient Instructions (Signed)
Medication Instructions:   START K-dur one (1) tablet by mouth (10 mEq) daily.   *If you need a refill on your cardiac medications before your next appointment, please call your pharmacy*   Lab Work:  TODAY!!!!  BMET  If you have labs (blood work) drawn today and your tests are completely normal, you will receive your results only by: MyChart Message (if you have MyChart) OR A paper copy in the mail If you have any lab test that is abnormal or we need to change your treatment, we will call you to review the results.   Testing/Procedures:  None ordered.   Follow-Up: At [facility], you and your health needs are our priority.  As part of our continuing mission to provide you with exceptional heart care, we have created designated Provider Care Teams.  These Care Teams include your primary Cardiologist (physician) and Advanced Practice Providers (APPs -  Physician Assistants and Nurse Practitioners) who all work together to provide you with the care you need, when you need it.  We recommend signing up for the patient portal called "MyChart".  Sign up information is provided on this After Visit Summary.  MyChart is used to connect with patients for Virtual Visits (Telemedicine).  Patients are able to view lab/test results, encounter notes, upcoming appointments, etc.  Non-urgent messages can be sent to your provider as well.   To learn more about what you can do with MyChart, go to [email or web address].    Your next appointment:   6 month(s)  The format for your next appointment:   In Person  Provider:   Laurance Flatten, MD  If primary card or EP is not listed click here to update    :1}    Other Instructions  Your physician wants you to follow-up in: 6 months with Dr. Shari Prows.  You will receive a reminder letter in the mail two months in advance. If you don't receive a letter, please call our office to schedule the follow-up appointment.   Important Information  About Sugar

## 2022-06-26 NOTE — Telephone Encounter (Signed)
S/w pt's pharmacy about pt's lasix.  Stated nurse was at pt's house to review bubble pack and lasix was not listed.  The pharmacy will call pt to review medications.  Stated the lasix was given to pt on July 17.  Will send to [facility] to [place].

## 2022-06-27 LAB — BASIC METABOLIC PANEL
BUN/Creatinine Ratio: 16 (ref 12–28)
BUN: 26 mg/dL (ref 8–27)
CO2: 21 mmol/L (ref 20–29)
Calcium: 9.7 mg/dL (ref 8.7–10.3)
Chloride: 102 mmol/L (ref 96–106)
Creatinine, Ser: 1.61 mg/dL — ABNORMAL HIGH (ref 0.57–1.00)
Glucose: 93 mg/dL (ref 70–99)
Potassium: 4.5 mmol/L (ref 3.5–5.2)
Sodium: 146 mmol/L — ABNORMAL HIGH (ref 134–144)
eGFR: 36 mL/min/{1.73_m2} — ABNORMAL LOW (ref >59)

## 2022-07-02 ENCOUNTER — Ambulatory Visit (INDEPENDENT_AMBULATORY_CARE_PROVIDER_SITE_OTHER): Payer: Medicare Other | Admitting: Surgery

## 2022-07-02 ENCOUNTER — Encounter: Payer: Self-pay | Admitting: Surgery

## 2022-07-02 ENCOUNTER — Ambulatory Visit: Payer: Self-pay

## 2022-07-02 DIAGNOSIS — M1711 Unilateral primary osteoarthritis, right knee: Secondary | ICD-10-CM

## 2022-07-02 DIAGNOSIS — M25561 Pain in right knee: Secondary | ICD-10-CM

## 2022-07-02 MED ORDER — LIDOCAINE HCL 1 % IJ SOLN
3.0000 mL | INTRAMUSCULAR | Status: AC | PRN
  Administered 2022-07-02: 3 mL

## 2022-07-02 MED ORDER — BUPIVACAINE HCL 0.5 % IJ SOLN
6.0000 mL | INTRAMUSCULAR | Status: AC | PRN
  Administered 2022-07-02: 6 mL via INTRA_ARTICULAR

## 2022-07-02 MED ORDER — METHYLPREDNISOLONE ACETATE 40 MG/ML IJ SUSP
80.0000 mg | INTRAMUSCULAR | Status: AC | PRN
  Administered 2022-07-02: 80 mg via INTRA_ARTICULAR
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
## 2022-07-11 ENCOUNTER — Encounter: Payer: Self-pay | Admitting: *Deleted

## 2022-08-01 ENCOUNTER — Ambulatory Visit: Payer: Medicare Other | Admitting: Surgery

## 2022-10-04 ENCOUNTER — Other Ambulatory Visit: Payer: Self-pay

## 2022-10-04 MED ORDER — FUROSEMIDE 20 MG PO TABS: 20.0000 mg | ORAL_TABLET | Freq: Every day | ORAL | 2 refills | Status: DC

## 2022-11-06 IMAGING — CT CT HEAD W/O CM
3 series · 15 of 47 positions shown, 18 images · non-contrast
Comparison: 11/15/2020

CLINICAL DATA: Stroke, follow-up, headache

EXAM:
CT HEAD WITHOUT CONTRAST
TECHNIQUE: Contiguous axial images were obtained from the base of the skull
through the vertex without intravenous contrast.

[Series 3: head 5.0 h30s · axial · 0.44mm/px · z∈[-76,+64]mm · 9 of 34 slices shown, 12 images]
[im 3/34  brain]
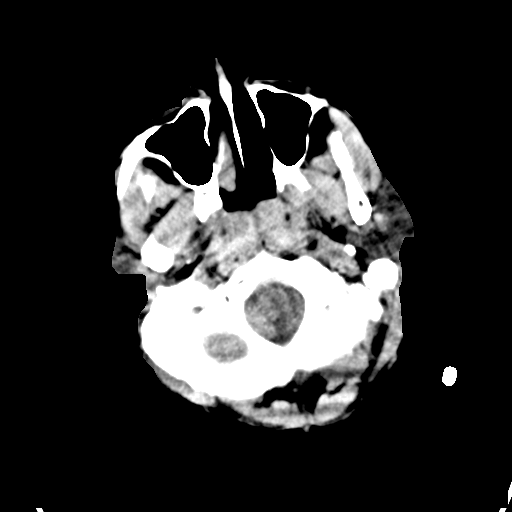
[im 3/34  bone]
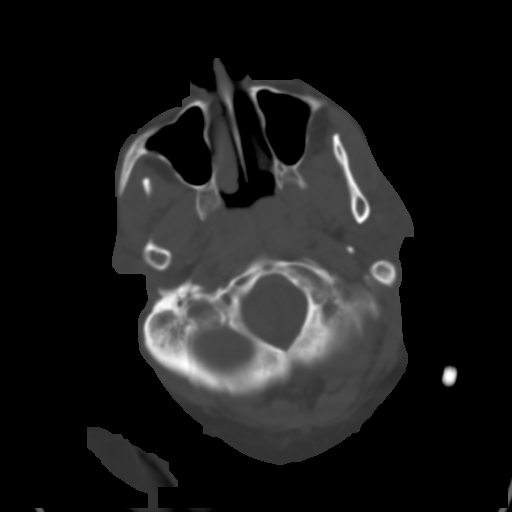
[im 6/34  brain]
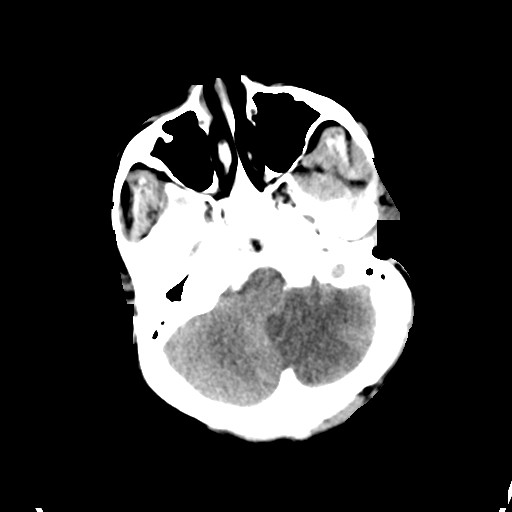
[im 10/34  brain]
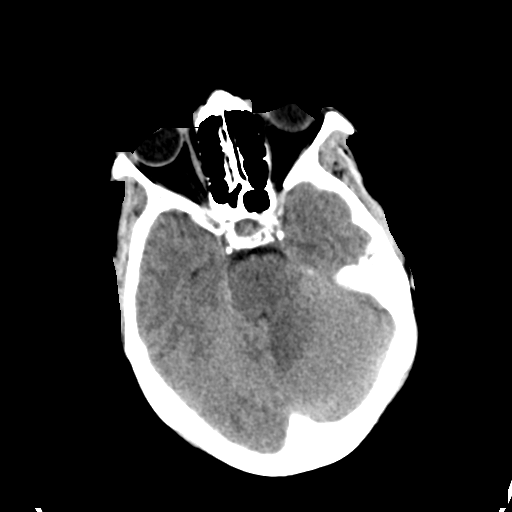
[im 13/34  brain]
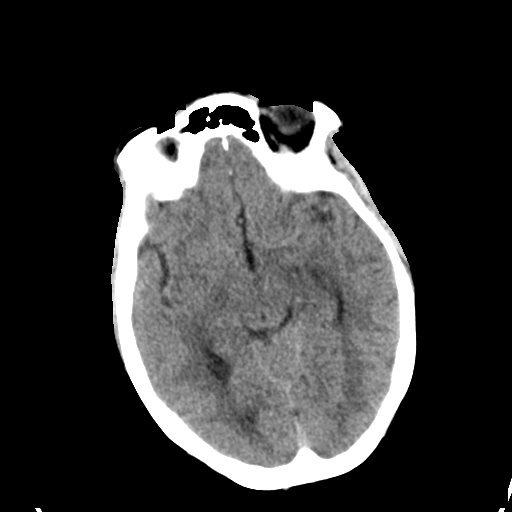
[im 18/34  brain]
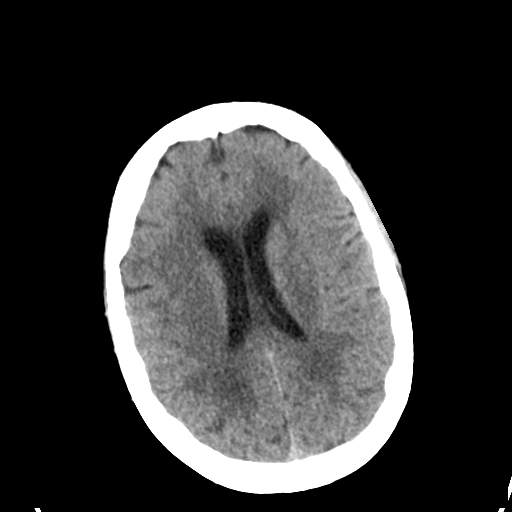
[im 18/34  bone]
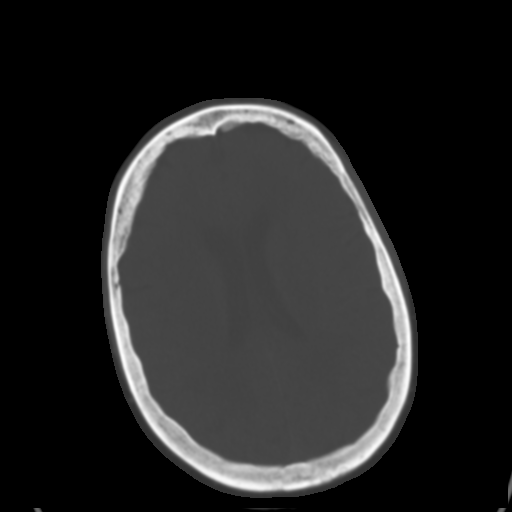
[im 21/34  brain]
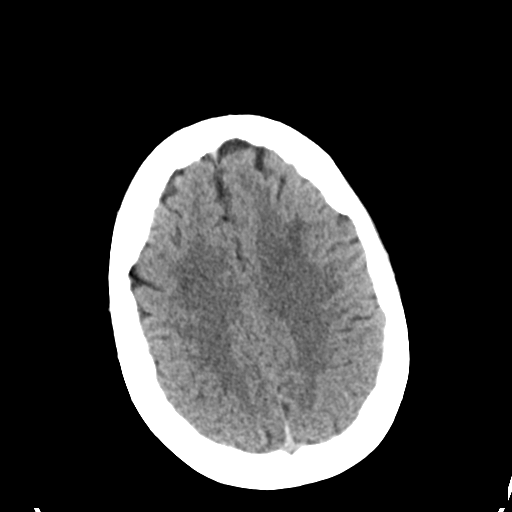
[im 24/34  brain]
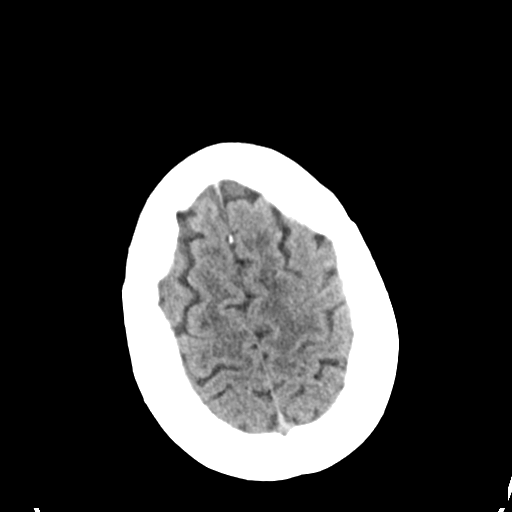
[im 28/34  brain]
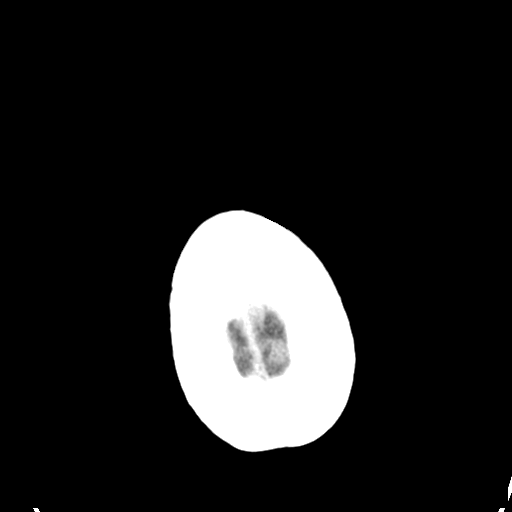
[im 31/34  brain]
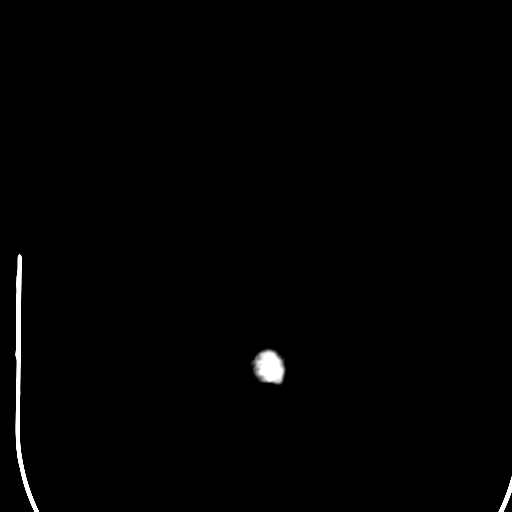
[im 31/34  bone]
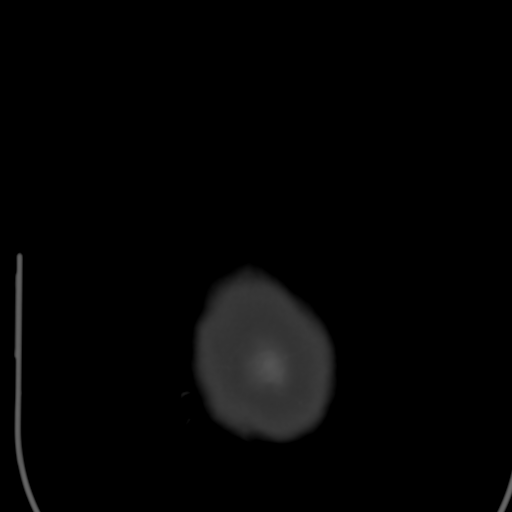

[Series 5: head 3.0 mpr cor · coronal · 0.34mm/px · 3 of 73 slices shown]
[im 25/73  brain]
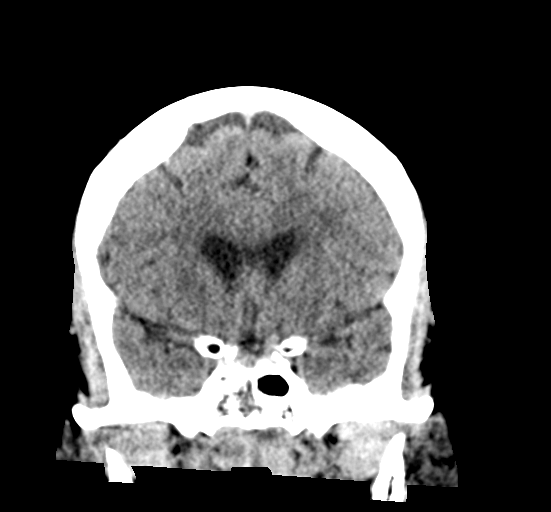
[im 33/73  brain]
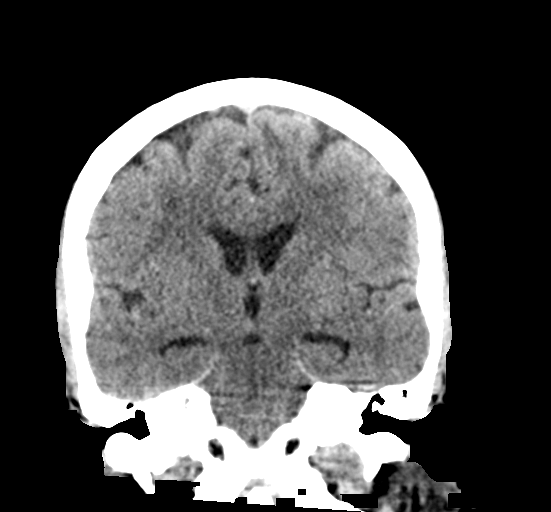
[im 41/73  brain]
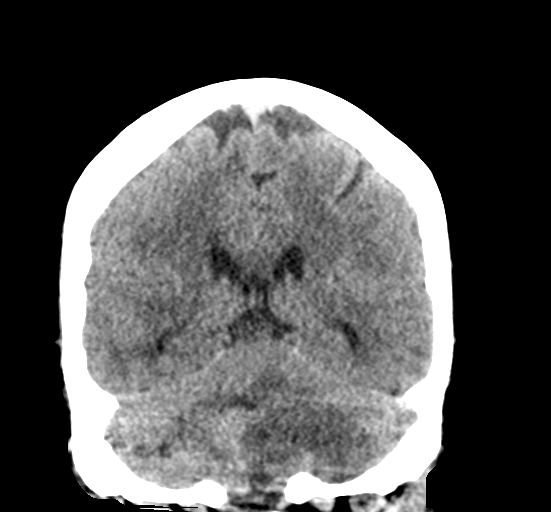

[Series 6: head 3.0 mpr sag · sagittal · 0.34mm/px · 3 of 63 slices shown]
[im 22/63  brain]
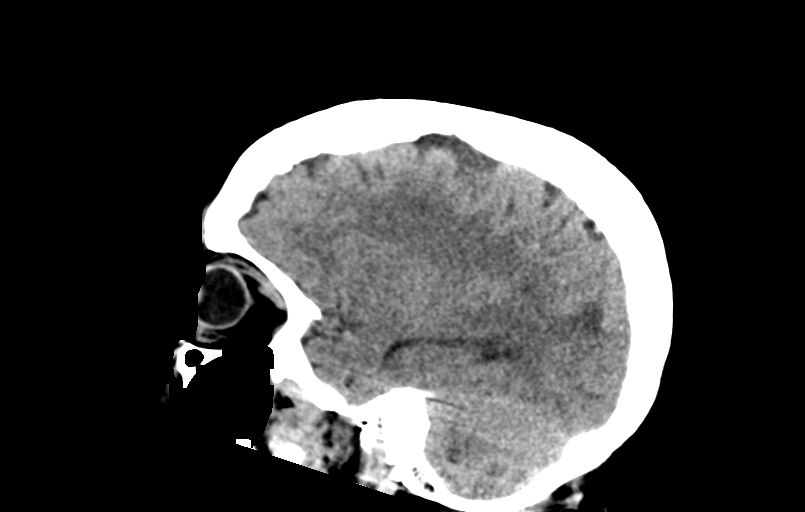
[im 32/63  brain]
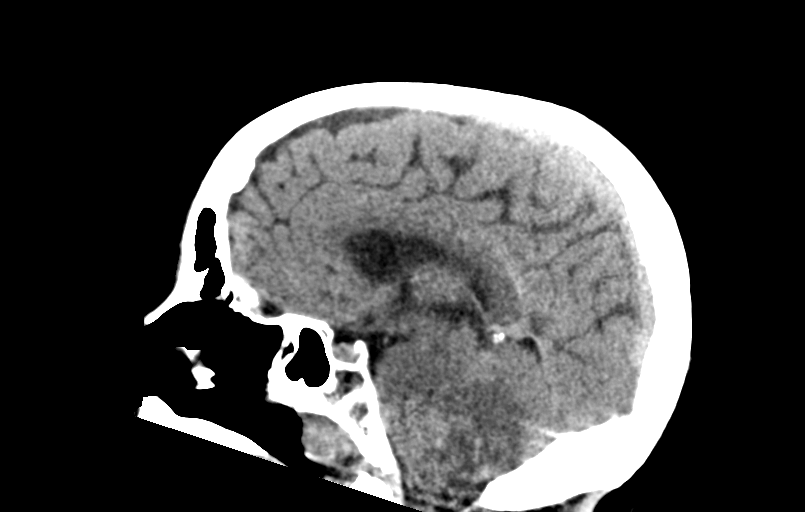
[im 42/63  brain]
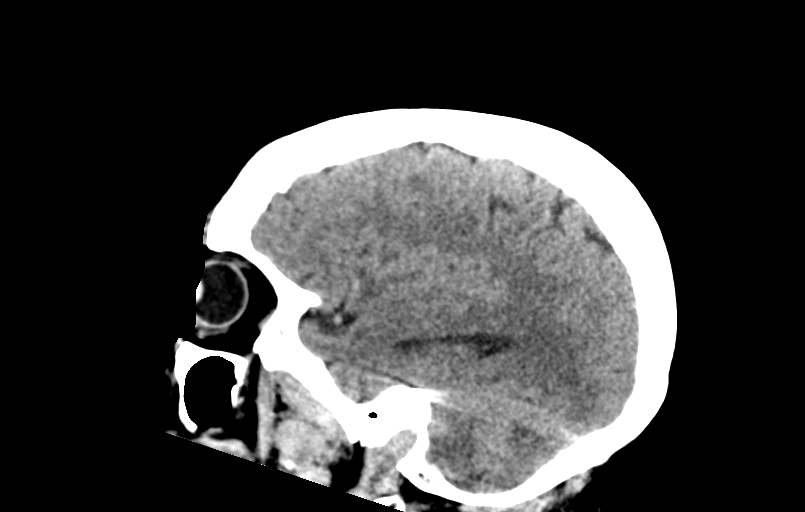

[15 of 47 positions shown; findings below may reference images not displayed]

FINDINGS: Brain: Evolving large left cerebellar infarction is again
identified. There is similar effacement of the fourth ventricle
without hydrocephalus. Mild ascending transtentorial herniation and
mild crowding at the foramen magnum are unchanged. There is no acute
intracranial hemorrhage.

Chronic right parietal infarct. Stable chronic microvascular
ischemic changes.

Vascular: No new finding.

Skull: Calvarium is unremarkable.

Sinuses/Orbits: No acute finding.

Other: None.
IMPRESSION: Evolving acute large left cerebellar infarction. Similar mass
effect. No hydrocephalus or hemorrhage.

## 2023-01-01 ENCOUNTER — Other Ambulatory Visit: Payer: Self-pay

## 2023-01-01 DIAGNOSIS — N1831 Chronic kidney disease, stage 3a: Secondary | ICD-10-CM

## 2023-01-01 DIAGNOSIS — Z79899 Other long term (current) drug therapy: Secondary | ICD-10-CM

## 2023-01-01 DIAGNOSIS — R072 Precordial pain: Secondary | ICD-10-CM

## 2023-01-01 DIAGNOSIS — R0602 Shortness of breath: Secondary | ICD-10-CM

## 2023-01-01 MED ORDER — CARVEDILOL 6.25 MG PO TABS: 6.2500 mg | ORAL_TABLET | Freq: Two times a day (BID) | ORAL | 2 refills | Status: DC

## 2023-03-07 ENCOUNTER — Encounter: Payer: Self-pay | Admitting: *Deleted

## 2023-03-21 ENCOUNTER — Other Ambulatory Visit: Payer: Self-pay

## 2023-03-21 MED ORDER — POTASSIUM CHLORIDE CRYS ER 10 MEQ PO TBCR: 10.0000 meq | EXTENDED_RELEASE_TABLET | Freq: Every day | ORAL | 1 refills | Status: DC

## 2023-03-21 MED ORDER — FUROSEMIDE 20 MG PO TABS: 20.0000 mg | ORAL_TABLET | Freq: Every day | ORAL | 1 refills | Status: DC

## 2023-03-21 NOTE — Addendum Note (Signed)
Addended by: Margaret Pyle D on: 03/21/2023 12:56 PM   Modules accepted: Orders

## 2023-04-08 ENCOUNTER — Other Ambulatory Visit: Payer: Self-pay | Admitting: *Deleted

## 2023-04-08 MED ORDER — POTASSIUM CHLORIDE CRYS ER 10 MEQ PO TBCR: 10.0000 meq | EXTENDED_RELEASE_TABLET | Freq: Every day | ORAL | 2 refills | Status: DC

## 2023-05-06 ENCOUNTER — Telehealth: Payer: Self-pay | Admitting: *Deleted

## 2023-05-06 NOTE — Telephone Encounter (Signed)
My Pharmacy,  [facility] [place] [phone] is requesting a refill for Potassium CL ER 10 MEQ. Patient medication list in epic has Potassium (Klor-Con M) 10 MEQ tablets on medication list.  Please confirm which prescription that the patient is taking. Thank you

## 2023-05-07 ENCOUNTER — Other Ambulatory Visit: Payer: Self-pay | Admitting: *Deleted

## 2023-05-07 MED ORDER — POTASSIUM CHLORIDE CRYS ER 10 MEQ PO TBCR: 10.0000 meq | EXTENDED_RELEASE_TABLET | Freq: Every day | ORAL | 3 refills | Status: DC

## 2023-05-14 ENCOUNTER — Other Ambulatory Visit: Payer: Self-pay

## 2023-06-04 ENCOUNTER — Other Ambulatory Visit: Payer: Self-pay

## 2023-06-19 ENCOUNTER — Encounter (HOSPITAL_COMMUNITY): Payer: Self-pay | Admitting: Emergency Medicine

## 2023-06-19 ENCOUNTER — Ambulatory Visit (HOSPITAL_COMMUNITY)
Admission: EM | Admit: 2023-06-19 | Discharge: 2023-06-19 | Disposition: A | Discharge status: Home / Self Care | Payer: 59 | Attending: Internal Medicine | Admitting: Internal Medicine

## 2023-06-19 DIAGNOSIS — R0602 Shortness of breath: Secondary | ICD-10-CM

## 2023-06-19 DIAGNOSIS — S46811A Strain of other muscles, fascia and tendons at shoulder and upper arm level, right arm, initial encounter: Secondary | ICD-10-CM

## 2023-06-19 DIAGNOSIS — I10 Essential (primary) hypertension: Secondary | ICD-10-CM | POA: Diagnosis not present

## 2023-06-19 MED ORDER — BACLOFEN 10 MG PO TABS: 10.0000 mg | ORAL_TABLET | Freq: Three times a day (TID) | ORAL | 0 refills | Status: DC

## 2023-06-19 MED ORDER — DEXAMETHASONE SODIUM PHOSPHATE 10 MG/ML IJ SOLN
10.0000 mg | Freq: Once | INTRAMUSCULAR | Status: AC
Start: 2023-06-19 — End: 2023-06-19
  Administered 2023-06-19: 10 mg via INTRAMUSCULAR

## 2023-06-19 MED ORDER — ACETAMINOPHEN 500 MG PO TABS: 1000.0000 mg | ORAL_TABLET | Freq: Four times a day (QID) | ORAL | 0 refills | Status: DC | PRN

## 2023-06-19 MED ORDER — DEXAMETHASONE SODIUM PHOSPHATE 10 MG/ML IJ SOLN
INTRAMUSCULAR | Status: AC
  Filled 2023-06-19: qty 1

## 2023-06-19 NOTE — Discharge Instructions (Addendum)
Your pain is likely due to a muscle strain which will improve on its own with time.   -I gave you a steroid shot in the clinic.  This will help reduce pain and inflammation. -Take Tylenol as needed at home for pain. - You may also take the prescribed muscle relaxer as directed as needed for muscle aches/spasm.  Do not take this medication and drive or drink alcohol as it can make you sleepy.  Mainly use this medicine at nighttime as needed. - Apply heat 20 minutes on then 20 minutes off and perform gentle range of motion exercises to the area of greatest pain to prevent muscle stiffness and provide further pain relief.   If you develop any new or worsening symptoms or if your symptoms do not start to improve, pleases return here or follow-up with your primary care provider. If your symptoms are severe, please go to the emergency room.

## 2023-06-19 NOTE — ED Triage Notes (Signed)
Pt c/o sob and right side sharp shoulder pains onset today. She states she has had neck pain right side x 4 days. Pt has labored breathing during triage but is able to speak in complete sentences.

## 2023-06-19 NOTE — ED Provider Notes (Signed)
[facility]    CSN: [id number] Arrival date & time: 06/19/23  1639      History   Chief Complaint Chief Complaint  Patient presents with   Shoulder Pain   Shortness of Breath    HPI Deborah Mcmahon is a 62 y.o. female.   Patient presents to urgent care for evaluation of right sided shoulder pain/right neck pain that started 2 days ago. She reports shortness of breath related to right neck/shoulder pain, taking deep breaths makes pain to the right shoulder/neck worse.  Denies any known trauma or injury to the right shoulder or neck.  When she moves her head to the left, the pain sometimes becomes so severe that it "takes her breath away" and causes some shortness of breath.  Pain improved significantly with heat therapy and when she is in the warm shower.  Denies numbness or tingling distally to injury, limb weakness, fever, chills, heart palpitations, dizziness, N/V/D, headache, vision changes, or rash. She has not attempted use of any OTC medications since "she cannot take ibuprofen and Tylenol does not really help".    Shoulder Pain Shortness of Breath   Past Medical History:  Diagnosis Date   Carpal tunnel syndrome    bilateral   Chronic back pain    spondylolisthesis   Constipation    takes an OTC stool softener daily as needed   Gallstones    History of migraine    Hypertension    Nocturia    Weakness    tingling     Patient Active Problem List   Diagnosis Date Noted   Allergic rhinitis 04/19/2022   DOE (dyspnea on exertion) 02/21/2022   Acute blood loss anemia 12/28/2020   Melena 12/28/2020   Substance use 11/16/2020   CVA (cerebral vascular accident) (HCC) 11/15/2020   Tobacco abuse 11/15/2020   CKD (chronic kidney disease), stage III (HCC) 11/15/2020   Essential hypertension 12/10/2018    Past Surgical History:  Procedure Laterality Date   BACK SURGERY     fusion   BIOPSY  12/31/2020   Procedure: BIOPSY;  Surgeon: Lemar Lofty.,  MD;  Location: [facility] ENDOSCOPY;  Service: Gastroenterology;;   CERVICAL SPINE SURGERY     x 4-fusion   COLONOSCOPY WITH PROPOFOL N/A 12/31/2020   Procedure: COLONOSCOPY WITH PROPOFOL;  Surgeon: Lemar Lofty., MD;  Location: [facility] ENDOSCOPY;  Service: Gastroenterology;  Laterality: N/A;   ESOPHAGOGASTRODUODENOSCOPY (EGD) WITH PROPOFOL N/A 12/31/2020   Procedure: ESOPHAGOGASTRODUODENOSCOPY (EGD) WITH PROPOFOL;  Surgeon: Meridee Score Netty Starring., MD;  Location: [facility] ENDOSCOPY;  Service: Gastroenterology;  Laterality: N/A;   MYRINGOTOMY     POLYPECTOMY  12/31/2020   Procedure: POLYPECTOMY;  Surgeon: Mansouraty, Netty Starring., MD;  Location: [facility] ENDOSCOPY;  Service: Gastroenterology;;   right knee surgery     screws   TONSILLECTOMY AND ADENOIDECTOMY     TUBAL LIGATION      OB History   No obstetric history on file.      Home Medications    Prior to Admission medications   Medication Sig Start Date End Date Taking? Authorizing Provider  acetaminophen (TYLENOL) 500 MG tablet Take 2 tablets (1,000 mg total) by mouth every 6 (six) hours as needed. 06/19/23  Yes Carlisle Beers, FNP  baclofen (LIORESAL) 10 MG tablet Take 1 tablet (10 mg total) by mouth 3 (three) times daily. 06/19/23  Yes Carlisle Beers, FNP  albuterol (VENTOLIN HFA) 108 (90 Base) MCG/ACT inhaler Inhale 2 puffs into the lungs every 6 (  six) hours as needed for wheezing or shortness of breath.    [provider]  allopurinol (ZYLOPRIM) 100 MG tablet Take 100 mg by mouth daily. 12/27/20   [provider]  amLODipine (NORVASC) 10 MG tablet Take 1 tablet (10 mg total) by mouth daily. 11/17/20   Lewie Chamber, MD  atorvastatin (LIPITOR) 40 MG tablet Take 1 tablet (40 mg total) by mouth daily. Patient not taking: Reported on 05/09/2022 11/17/20   Lewie Chamber, MD  buPROPion (WELLBUTRIN XL) 150 MG 24 hr tablet Take 150 mg by mouth every morning. Patient not taking: Reported on 05/09/2022 11/29/20   [provider]  carvedilol (COREG) 6.25 MG tablet Take 1 tablet (6.25 mg total) by mouth 2 (two) times daily. 01/01/23   Meriam Sprague, MD  clopidogrel (PLAVIX) 75 MG tablet Take 75 mg by mouth daily. 04/08/22   [provider]  fluticasone (FLONASE) 50 MCG/ACT nasal spray Place 2 sprays into both nostrils daily. 04/19/22   Cobb, Ruby Cola, NP  fluticasone furoate-vilanterol (BREO ELLIPTA) 100-25 MCG/ACT AEPB Inhale 1 puff into the lungs daily. 04/19/22   Cobb, Ruby Cola, NP  furosemide (LASIX) 20 MG tablet Take 1 tablet (20 mg total) by mouth daily. 03/21/23   Swinyer, Zachary George, NP  hydrALAZINE (APRESOLINE) 100 MG tablet Take 100 mg by mouth 2 (two) times daily. 04/08/22   [provider]  methocarbamol (ROBAXIN) 750 MG tablet Take 750 mg by mouth 2 (two) times daily as needed for muscle spasms.    [provider]  metoprolol succinate (TOPROL-XL) 25 MG 24 hr tablet Take 25 mg by mouth every morning. 06/10/22   [provider]  omega-3 acid ethyl esters (LOVAZA) 1 g capsule Take 1 capsule by mouth daily. 04/08/22   [provider]  pantoprazole (PROTONIX) 40 MG tablet Take 40 mg by mouth daily. 04/08/22   [provider]  potassium chloride (KLOR-CON M) 10 MEQ tablet Take 1 tablet (10 mEq total) by mouth daily. 05/07/23 05/06/24  Swinyer, Zachary George, NP  Prenatal Vit-Fe Fumarate-FA (M-NATAL PLUS) 27-1 MG TABS Take 1 tablet by mouth every morning. 03/15/22   [provider]  sucralfate (CARAFATE) 1 g tablet Take 1 g by mouth 4 (four) times daily. 12/14/20   [provider]  Vitamin D, Ergocalciferol, (DRISDOL) 1.25 MG (50000 UNIT) CAPS capsule Take 50,000 Units by mouth every 14 (fourteen) days. 03/15/22   [provider]    Family History Family History  Problem Relation Age of Onset   Anesthesia problems Neg Hx     Social History Social History   Tobacco Use   Smoking status: Every Day    Current packs/day:  0.50    Average packs/day: 0.5 packs/day for 41.0 years (20.5 ttl pk-yrs)    Types: Cigarettes   Smokeless tobacco: Never   Tobacco comments:    6 - 7 cigarettes smoked daily ARJ 02/21/22  Substance Use Topics   Alcohol use: No   Drug use: No     Allergies   Patient has no known allergies.   Review of Systems Review of Systems  Respiratory:  Positive for shortness of breath.   Per HPI   Physical Exam Triage Vital Signs ED Triage Vitals  Encounter Vitals Group     BP 06/19/23 1713 (!) 184/104     Systolic BP Percentile --      Diastolic BP Percentile --      Pulse Rate 06/19/23 1645 89  Resp 06/19/23 1645 (!) 36     Temp 06/19/23 1713 97.8 F (36.6 C)     Temp Source 06/19/23 1713 Oral     SpO2 06/19/23 1645 100 %     Weight --      Height --      Head Circumference --      Peak Flow --      Pain Score 06/19/23 1714 10     Pain Loc --      Pain Education --      Exclude from Growth Chart --    No data found.  Updated Vital Signs BP (!) 179/84 (BP Location: Right Arm)   Pulse 63   Temp 97.8 F (36.6 C) (Oral)   Resp 20   SpO2 99%   Visual Acuity Right Eye Distance:   Left Eye Distance:   Bilateral Distance:    Right Eye Near:   Left Eye Near:    Bilateral Near:     Physical Exam Vitals and nursing note reviewed.  Constitutional:      Appearance: She is not ill-appearing or toxic-appearing.  HENT:     Head: Normocephalic and atraumatic.     Right Ear: Hearing and external ear normal.     Left Ear: Hearing and external ear normal.     Nose: Nose normal.     Mouth/Throat:     Lips: Pink.  Eyes:     General: Lids are normal. Vision grossly intact. Gaze aligned appropriately.     Extraocular Movements: Extraocular movements intact.     Conjunctiva/sclera: Conjunctivae normal.  Pulmonary:     Effort: Pulmonary effort is normal.  Musculoskeletal:     Cervical back: Neck supple. No edema, erythema, signs of trauma, rigidity, torticollis or  crepitus. Pain with movement (Pain with head movement to the left and right with decreased range of motion secondary to pain) and muscular tenderness (Tenderness to the right-sided cervical paraspinals and some points over the right trapezius muscle.) present. No spinous process tenderness. Decreased range of motion.  Skin:    General: Skin is warm and dry.     Capillary Refill: Capillary refill takes less than 2 seconds.     Findings: No rash.  Neurological:     General: No focal deficit present.     Mental Status: She is alert and oriented to person, place, and time. Mental status is at baseline.     Cranial Nerves: Cranial nerves 2-12 are intact. No dysarthria or facial asymmetry.     Sensory: Sensation is intact.     Motor: Motor function is intact.     Coordination: Coordination is intact.     Comments: Neurologically intact to her baseline without focal deficit.  5/5 grip strength to bilateral upper extremities.  5/5 strength against resistance with head movement to the left and right.  Sensation intact distally.  Psychiatric:        Mood and Affect: Mood normal.        Speech: Speech normal.        Behavior: Behavior normal.        Thought Content: Thought content normal.        Judgment: Judgment normal.      UC Treatments / Results  Labs (all labs ordered are listed, but only abnormal results are displayed) Labs Reviewed - No data to display  EKG   Radiology No results found.  Procedures Procedures (including critical care time)  Medications Ordered in UC Medications  dexamethasone (DECADRON) injection 10 mg (10 mg Intramuscular Given 06/19/23 1727)    Initial Impression / Assessment and Plan / UC Course  I have reviewed the triage vital signs and the nursing notes.  Pertinent labs & imaging results that were available during my care of the patient were reviewed by me and considered in my medical decision making (see chart for details).   1.  Strain of right  trapezius muscle, initial encounter, shortness of breath Presentation consistent with acute muscle spasm and muscle strain of the right trapezius muscle.  Will defer imaging based on stable musculoskeletal exam and atraumatic mechanism of injury.  Initially, patient tachypneic and in severe pain.  Patient given dexamethasone 10 mg IM and heat blankets placed to area of tenderness.  Pain improved significantly prior to discharge and patient was able to ambulate out of the clinic without difficulty.    Will manage this at home with Tylenol and baclofen as needed.  Drowsiness precautions discussed regarding baclofen prescription. Will defer prednisone burst due to use of Plavix blood thinner.  Blood pressure improved significantly to 179/84 on recheck, previously 184/104.  Respiratory rate improved significantly to 20 respirations per minute.  She has had her blood pressure medication this morning, advised to continue taking medications as prescribed and follow-up with PCP for ongoing evaluation and management of elevated blood pressure.  Suspect elevated blood pressure reading today secondary to pain.  Counseled patient on potential for adverse effects with medications prescribed/recommended today, strict ER and return-to-clinic precautions discussed, patient verbalized understanding.    Final Clinical Impressions(s) / UC Diagnoses   Final diagnoses:  Strain of right trapezius muscle, initial encounter  Shortness of breath  Essential hypertension     Discharge Instructions      Your pain is likely due to a muscle strain which will improve on its own with time.   -I gave you a steroid shot in the clinic.  This will help reduce pain and inflammation. -Take Tylenol as needed at home for pain. - You may also take the prescribed muscle relaxer as directed as needed for muscle aches/spasm.  Do not take this medication and drive or drink alcohol as it can make you sleepy.  Mainly use this  medicine at nighttime as needed. - Apply heat 20 minutes on then 20 minutes off and perform gentle range of motion exercises to the area of greatest pain to prevent muscle stiffness and provide further pain relief.   If you develop any new or worsening symptoms or if your symptoms do not start to improve, pleases return here or follow-up with your primary care provider. If your symptoms are severe, please go to the emergency room.     ED Prescriptions     Medication Sig Dispense Auth. Provider   baclofen (LIORESAL) 10 MG tablet Take 1 tablet (10 mg total) by mouth 3 (three) times daily. 30 each Carlisle Beers, FNP   acetaminophen (TYLENOL) 500 MG tablet Take 2 tablets (1,000 mg total) by mouth every 6 (six) hours as needed. 30 tablet Carlisle Beers, FNP      PDMP not reviewed this encounter.   Carlisle Beers, [place] 06/19/23 [phone]

## 2023-11-24 ENCOUNTER — Other Ambulatory Visit: Payer: Self-pay

## 2023-11-24 DIAGNOSIS — R0602 Shortness of breath: Secondary | ICD-10-CM

## 2023-11-24 DIAGNOSIS — Z79899 Other long term (current) drug therapy: Secondary | ICD-10-CM

## 2023-11-24 DIAGNOSIS — N1831 Chronic kidney disease, stage 3a: Secondary | ICD-10-CM

## 2023-11-24 DIAGNOSIS — R072 Precordial pain: Secondary | ICD-10-CM

## 2023-11-24 MED ORDER — CARVEDILOL 6.25 MG PO TABS: 6.2500 mg | ORAL_TABLET | Freq: Two times a day (BID) | ORAL | 0 refills | Status: DC

## 2024-01-20 ENCOUNTER — Encounter (HOSPITAL_COMMUNITY): Payer: Self-pay

## 2024-01-20 ENCOUNTER — Ambulatory Visit (HOSPITAL_COMMUNITY)
Admission: EM | Admit: 2024-01-20 | Discharge: 2024-01-20 | Disposition: A | Discharge status: Home / Self Care | Payer: 59 | Attending: Emergency Medicine | Admitting: Emergency Medicine

## 2024-01-20 ENCOUNTER — Emergency Department (HOSPITAL_COMMUNITY): Payer: 59

## 2024-01-20 ENCOUNTER — Encounter (HOSPITAL_COMMUNITY): Payer: Self-pay | Admitting: Emergency Medicine

## 2024-01-20 ENCOUNTER — Emergency Department (HOSPITAL_COMMUNITY)
Admission: EM | Admit: 2024-01-20 | Discharge: 2024-01-20 | Discharge status: Against Medical Advice | Payer: Medicaid Other | Attending: Emergency Medicine | Admitting: Emergency Medicine

## 2024-01-20 DIAGNOSIS — I169 Hypertensive crisis, unspecified: Secondary | ICD-10-CM | POA: Diagnosis not present

## 2024-01-20 DIAGNOSIS — Z5321 Procedure and treatment not carried out due to patient leaving prior to being seen by health care provider: Secondary | ICD-10-CM | POA: Diagnosis not present

## 2024-01-20 DIAGNOSIS — R112 Nausea with vomiting, unspecified: Secondary | ICD-10-CM

## 2024-01-20 DIAGNOSIS — R109 Unspecified abdominal pain: Secondary | ICD-10-CM | POA: Insufficient documentation

## 2024-01-20 DIAGNOSIS — I6782 Cerebral ischemia: Secondary | ICD-10-CM | POA: Diagnosis not present

## 2024-01-20 HISTORY — DX: Nausea with vomiting, unspecified: R11.2

## 2024-01-20 LAB — COMPREHENSIVE METABOLIC PANEL
ALT: 14 U/L (ref 0–44)
AST: 22 U/L (ref 15–41)
Albumin: 3.5 g/dL (ref 3.5–5.0)
Alkaline Phosphatase: 57 U/L (ref 38–126)
Anion gap: 14 (ref 5–15)
BUN: 19 mg/dL (ref 8–23)
CO2: 20 mmol/L — ABNORMAL LOW (ref 22–32)
Calcium: 8.9 mg/dL (ref 8.9–10.3)
Chloride: 103 mmol/L (ref 98–111)
Creatinine, Ser: 1.26 mg/dL — ABNORMAL HIGH (ref 0.44–1.00)
GFR, Estimated: 48 mL/min — ABNORMAL LOW (ref >60)
Glucose, Bld: 101 mg/dL — ABNORMAL HIGH (ref 70–99)
Potassium: 4 mmol/L (ref 3.5–5.1)
Sodium: 137 mmol/L (ref 135–145)
Total Bilirubin: 0.8 mg/dL (ref 0.0–1.2)
Total Protein: 7.4 g/dL (ref 6.5–8.1)

## 2024-01-20 LAB — CBC WITH DIFFERENTIAL/PLATELET
Abs Immature Granulocytes: 0.01 10*3/uL (ref 0.00–0.07)
Basophils Absolute: 0 10*3/uL (ref 0.0–0.1)
Basophils Relative: 0 %
Eosinophils Absolute: 0 10*3/uL (ref 0.0–0.5)
Eosinophils Relative: 0 %
HCT: 47.8 % — ABNORMAL HIGH (ref 36.0–46.0)
Hemoglobin: 15.6 g/dL — ABNORMAL HIGH (ref 12.0–15.0)
Immature Granulocytes: 0 %
Lymphocytes Relative: 10 %
Lymphs Abs: 0.6 10*3/uL — ABNORMAL LOW (ref 0.7–4.0)
MCH: 28.2 pg (ref 26.0–34.0)
MCHC: 32.6 g/dL (ref 30.0–36.0)
MCV: 86.3 fL (ref 80.0–100.0)
Monocytes Absolute: 0.3 10*3/uL (ref 0.1–1.0)
Monocytes Relative: 5 %
Neutro Abs: 5.1 10*3/uL (ref 1.7–7.7)
Neutrophils Relative %: 85 %
Platelets: 224 10*3/uL (ref 150–400)
RBC: 5.54 MIL/uL — ABNORMAL HIGH (ref 3.87–5.11)
RDW: 16.6 % — ABNORMAL HIGH (ref 11.5–15.5)
WBC: 6.1 10*3/uL (ref 4.0–10.5)
nRBC: 0 % (ref 0.0–0.2)

## 2024-01-20 LAB — POCT FASTING CBG KUC MANUAL ENTRY: POCT Glucose (KUC): 115 mg/dL — AB (ref 70–99)

## 2024-01-20 LAB — I-STAT CG4 LACTIC ACID, ED
Lactic Acid, Venous: 1.5 mmol/L (ref 0.5–1.9)
Lactic Acid, Venous: 2 mmol/L (ref 0.5–1.9)

## 2024-01-20 LAB — LIPASE, BLOOD: Lipase: 24 U/L (ref 11–51)

## 2024-01-20 MED ORDER — SODIUM CHLORIDE 0.9 % IV BOLUS
1000.0000 mL | Freq: Once | INTRAVENOUS | Status: AC
  Administered 2024-01-20: 1000 mL via INTRAVENOUS

## 2024-01-20 MED ORDER — ONDANSETRON HCL 4 MG/2ML IJ SOLN
4.0000 mg | INTRAMUSCULAR | Status: DC
Start: 2024-01-20 — End: 2024-01-20

## 2024-01-20 MED ORDER — ONDANSETRON HCL 4 MG/2ML IJ SOLN
4.0000 mg | Freq: Once | INTRAMUSCULAR | Status: AC
  Administered 2024-01-20: 4 mg via INTRAVENOUS
  Filled 2024-01-20: qty 2

## 2024-01-20 MED ORDER — DICYCLOMINE HCL 10 MG/ML IM SOLN
20.0000 mg | Freq: Once | INTRAMUSCULAR | Status: AC
  Administered 2024-01-20: 20 mg via INTRAMUSCULAR
  Filled 2024-01-20: qty 2

## 2024-01-20 NOTE — ED Triage Notes (Signed)
 Informed by the front desk staff that Patient has passed out while sitting in the lobby. Came out and saw patient slumped over on the man beside her but she was responsive to verbal stimuli. Patient AO x4, diaphoretic, weak, labored breathing.   Patient presenting for severe emesis onset this morning. Has hypertension but has not been taking her BP meds. No known sick exposure. Having diarrhea and constipation alternating.

## 2024-01-20 NOTE — ED Triage Notes (Signed)
 Note from UC:   Informed by the front desk staff that Patient has passed out while sitting in the lobby. Came out and saw patient slumped over on the man beside her but she was responsive to verbal stimuli. Patient AO x4, diaphoretic, weak, labored breathing.    Patient presenting for severe emesis onset this morning. Has hypertension but has not been taking her BP meds. No known sick exposure. Having diarrhea and constipation alternating.   Pt is presenting with similar symptoms and affect currently.

## 2024-01-20 NOTE — ED Provider Notes (Signed)
 [facility]    CSN: [id number] Arrival date & time: 01/20/24  1733      History   Chief Complaint Chief Complaint  Patient presents with   Emesis   Loss of Consciousness    HPI Deborah Mcmahon is a 63 y.o. female.   63 year old female, Deborah Mcmahon, presents to urgent care for evaluation of "vomiting all day",  fever, chills today, patient has been drinking Pedialyte per family member.  Per family member patient has not been taking her blood pressure medicine, also complaining of headache.  Patient is alert and oriented x 4, moves all extremities well, GCS 15. Pt denies recent illness exposure.  PMH: Chronic kidney disease, HTN, chronic back pain,substance abuse,CVA  The history is provided by the patient and a relative. No language interpreter was used.    Past Medical History:  Diagnosis Date   Carpal tunnel syndrome    bilateral   Chronic back pain    spondylolisthesis   Constipation    takes an OTC stool softener daily as needed   Gallstones    History of migraine    Hypertension    Nausea and vomiting 01/20/2024   Nocturia    Weakness    tingling     Patient Active Problem List   Diagnosis Date Noted   Hypertensive crisis 01/20/2024   Nausea and vomiting 01/20/2024   Allergic rhinitis 04/19/2022   DOE (dyspnea on exertion) 02/21/2022   Acute blood loss anemia 12/28/2020   Melena 12/28/2020   Substance use 11/16/2020   CVA (cerebral vascular accident) (HCC) 11/15/2020   Tobacco abuse 11/15/2020   CKD (chronic kidney disease), stage III (HCC) 11/15/2020   Essential hypertension 12/10/2018    Past Surgical History:  Procedure Laterality Date   BACK SURGERY     fusion   BIOPSY  12/31/2020   Procedure: BIOPSY;  Surgeon: Lemar Lofty., MD;  Location: [facility] ENDOSCOPY;  Service: Gastroenterology;;   CERVICAL SPINE SURGERY     x 4-fusion   COLONOSCOPY WITH PROPOFOL N/A 12/31/2020   Procedure: COLONOSCOPY WITH PROPOFOL;  Surgeon:  Lemar Lofty., MD;  Location: [facility] ENDOSCOPY;  Service: Gastroenterology;  Laterality: N/A;   ESOPHAGOGASTRODUODENOSCOPY (EGD) WITH PROPOFOL N/A 12/31/2020   Procedure: ESOPHAGOGASTRODUODENOSCOPY (EGD) WITH PROPOFOL;  Surgeon: Meridee Score Netty Starring., MD;  Location: [facility] ENDOSCOPY;  Service: Gastroenterology;  Laterality: N/A;   MYRINGOTOMY     POLYPECTOMY  12/31/2020   Procedure: POLYPECTOMY;  Surgeon: Mansouraty, Netty Starring., MD;  Location: [facility] ENDOSCOPY;  Service: Gastroenterology;;   right knee surgery     screws   TONSILLECTOMY AND ADENOIDECTOMY     TUBAL LIGATION      OB History   No obstetric history on file.      Home Medications    Prior to Admission medications   Medication Sig Start Date End Date Taking? Authorizing Provider  acetaminophen (TYLENOL) 500 MG tablet Take 2 tablets (1,000 mg total) by mouth every 6 (six) hours as needed. 06/19/23   Carlisle Beers, FNP  albuterol (VENTOLIN HFA) 108 (90 Base) MCG/ACT inhaler Inhale 2 puffs into the lungs every 6 (six) hours as needed for wheezing or shortness of breath.    [provider]  allopurinol (ZYLOPRIM) 100 MG tablet Take 100 mg by mouth daily. 12/27/20   [provider]  amLODipine (NORVASC) 10 MG tablet Take 1 tablet (10 mg total) by mouth daily. 11/17/20   Lewie Chamber, MD  atorvastatin (LIPITOR) 40 MG tablet  Take 1 tablet (40 mg total) by mouth daily. Patient not taking: Reported on 05/09/2022 11/17/20   Lewie Chamber, MD  baclofen (LIORESAL) 10 MG tablet Take 1 tablet (10 mg total) by mouth 3 (three) times daily. 06/19/23   Carlisle Beers, FNP  buPROPion (WELLBUTRIN XL) 150 MG 24 hr tablet Take 150 mg by mouth every morning. Patient not taking: Reported on 05/09/2022 11/29/20   [provider]  carvedilol (COREG) 6.25 MG tablet Take 1 tablet (6.25 mg total) by mouth 2 (two) times daily. Please call [phone] to schedule an appointment for future refills. Thank you. 1st attempt.  11/24/23   Swinyer, Zachary George, NP  clopidogrel (PLAVIX) 75 MG tablet Take 75 mg by mouth daily. 04/08/22   [provider]  fluticasone (FLONASE) 50 MCG/ACT nasal spray Place 2 sprays into both nostrils daily. 04/19/22   Cobb, Ruby Cola, NP  fluticasone furoate-vilanterol (BREO ELLIPTA) 100-25 MCG/ACT AEPB Inhale 1 puff into the lungs daily. 04/19/22   Cobb, Ruby Cola, NP  furosemide (LASIX) 20 MG tablet Take 1 tablet (20 mg total) by mouth daily. 03/21/23   Swinyer, Zachary George, NP  hydrALAZINE (APRESOLINE) 100 MG tablet Take 100 mg by mouth 2 (two) times daily. 04/08/22   [provider]  methocarbamol (ROBAXIN) 750 MG tablet Take 750 mg by mouth 2 (two) times daily as needed for muscle spasms.    [provider]  metoprolol succinate (TOPROL-XL) 25 MG 24 hr tablet Take 25 mg by mouth every morning. 06/10/22   [provider]  omega-3 acid ethyl esters (LOVAZA) 1 g capsule Take 1 capsule by mouth daily. 04/08/22   [provider]  pantoprazole (PROTONIX) 40 MG tablet Take 40 mg by mouth daily. 04/08/22   [provider]  potassium chloride (KLOR-CON M) 10 MEQ tablet Take 1 tablet (10 mEq total) by mouth daily. 05/07/23 05/06/24  Swinyer, Zachary George, NP  Prenatal Vit-Fe Fumarate-FA (M-NATAL PLUS) 27-1 MG TABS Take 1 tablet by mouth every morning. 03/15/22   [provider]  sucralfate (CARAFATE) 1 g tablet Take 1 g by mouth 4 (four) times daily. 12/14/20   [provider]  Vitamin D, Ergocalciferol, (DRISDOL) 1.25 MG (50000 UNIT) CAPS capsule Take 50,000 Units by mouth every 14 (fourteen) days. 03/15/22   [provider]    Family History Family History  Problem Relation Age of Onset   Anesthesia problems Neg Hx     Social History Social History   Tobacco Use   Smoking status: Every Day    Current packs/day: 0.50    Average packs/day: 0.5 packs/day for 41.0 years (20.5 ttl pk-yrs)    Types: Cigarettes   Smokeless  tobacco: Never   Tobacco comments:    6 - 7 cigarettes smoked daily ARJ 02/21/22  Substance Use Topics   Alcohol use: No   Drug use: No     Allergies   Patient has no known allergies.   Review of Systems Review of Systems  Constitutional:  Positive for chills and fever.  Respiratory:  Negative for cough.   Cardiovascular:  Negative for chest pain and palpitations.  Gastrointestinal:  Positive for constipation, diarrhea, nausea and vomiting.  All other systems reviewed and are negative.    Physical Exam Triage Vital Signs ED Triage Vitals [01/20/24 1745]  Encounter Vitals Group     BP (!) 216/119     Systolic BP Percentile      Diastolic BP Percentile  Pulse Rate 92     Resp 20     Temp 97.8 F (36.6 C)     Temp Source Oral     SpO2 99 %     Weight      Height      Head Circumference      Peak Flow      Pain Score      Pain Loc      Pain Education      Exclude from Growth Chart    No data found.  Updated Vital Signs BP (!) 216/119 (BP Location: Right Wrist)   Pulse 92   Temp 97.8 F (36.6 C) (Oral)   Resp 20   SpO2 99%   Visual Acuity Right Eye Distance:   Left Eye Distance:   Bilateral Distance:    Right Eye Near:   Left Eye Near:    Bilateral Near:     Physical Exam Vitals and nursing note reviewed.  Constitutional:      General: She is awake.     Appearance: She is well-developed and well-groomed.  HENT:     Head: Normocephalic.  Eyes:     Pupils: Pupils are equal, round, and reactive to light.  Cardiovascular:     Rate and Rhythm: Normal rate.  Pulmonary:     Effort: Pulmonary effort is normal.  Neurological:     General: No focal deficit present.     Mental Status: She is alert and oriented to person, place, and time.     GCS: GCS eye subscore is 4. GCS verbal subscore is 5. GCS motor subscore is 6.     Cranial Nerves: No cranial nerve deficit.     Sensory: No sensory deficit.  Psychiatric:        Attention and Perception:  Attention normal.        Mood and Affect: Mood normal.        Speech: Speech normal.        Behavior: Behavior normal. Behavior is cooperative.      UC Treatments / Results  Labs (all labs ordered are listed, but only abnormal results are displayed) Labs Reviewed  POCT FASTING CBG [facility] MANUAL ENTRY - Abnormal; Notable for the following components:      Result Value   POCT Glucose ([facility]) 115 (*)    All other components within normal limits    EKG   Radiology No results found.  Procedures Procedures (including critical care time)  Medications Ordered in UC Medications - No data to display  Initial Impression / Assessment and Plan / UC Course  I have reviewed the triage vital signs and the nursing notes.  Pertinent labs & imaging results that were available during my care of the patient were reviewed by me and considered in my medical decision making (see chart for details).  Clinical Course as of 01/20/24 1804  Tue Jan 20, 2024  1752 Pt with family member wishes to go POV to ER [JD]    Clinical Course User Index [JD] Fishel Wamble, Para March, NP  Given pt presenting symptoms, PMH, will send to Er for further evaluation,labs, IV fluids,charge nurse [place] aware.  Ddx: Hypertension crisis, nausea and vomiting, hypovolemia, electrolyte imbalance,vasovagal/near syncopal episode Final Clinical Impressions(s) / UC Diagnoses   Final diagnoses:  Hypertensive crisis  Nausea and vomiting, unspecified vomiting type   Discharge Instructions   None    ED Prescriptions   None    PDMP not reviewed this encounter.  Clancy Gourd, NP 01/20/24 [phone]

## 2024-01-20 NOTE — ED Provider Triage Note (Addendum)
 Emergency Medicine Provider Triage Evaluation Note  Deborah Mcmahon , a 63 y.o. female  was evaluated in triage.  Pt complains of abdominal pain, nausea, vomiting.  Reports symptoms began this morning.  States has been throwing up all day.  Denies chest pain or shortness of breath.  Denies any suspicious food intake.  Denies diarrhea, is endorsing abdominal pain located all over.  Denies known sick contacts.  No dysuria or fevers at home.  Of note, patient was acting slightly altered in triage.  Patient was noted to be drooling on exam, had to be shaken awake multiple times.  Will add on head CT.  Review of Systems  Positive:  Negative:   Physical Exam  BP (!) 184/107 (BP Location: Right Arm)   Pulse 99   Temp (!) 97.5 F (36.4 C)   Resp (!) 24   SpO2 99%  Gen:   Awake, no distress   Resp:  Normal effort  MSK:   Moves extremities without difficulty  Other:    Medical Decision Making  Medically screening exam initiated at 6:07 PM.  Appropriate orders placed.  Deborah Mcmahon was informed that the remainder of the evaluation will be completed by another provider, this initial triage assessment does not replace that evaluation, and the importance of remaining in the ED until their evaluation is complete.     Al Decant, PA-C 01/20/24 1807    Al Decant, PA-C 01/20/24 2015

## 2024-01-20 NOTE — ED Triage Notes (Signed)
 Sort RN note:  This Clinical research associate was called to Registration d/t Pt going in and out of consciousness.  Pt noted to have head laid back and eyes closed.  However, Pt immediately answered this writer's questions appropriately.    Pt c/o generalized abdominal pain and n/v starting this morning.

## 2024-01-20 NOTE — ED Notes (Signed)
 Patient is being discharged from the Urgent Care and sent to the Emergency Department via personal opperated vehicle . Per [facility], patient is in need of higher level of care due to LOC, hypertension, emesis. Patient is aware and verbalizes understanding of plan of care.  Vitals:   01/20/24 1745  BP: (!) 216/119  Pulse: 92  Resp: 20  Temp: 97.8 F (36.6 C)  SpO2: 99%

## 2024-01-20 NOTE — Discharge Instructions (Signed)
 Go to ER for further evaluation .

## 2024-01-20 NOTE — ED Notes (Signed)
 Pt stated she was leaving. IV removed. Pt seen leaving the ED

## 2024-01-25 LAB — CULTURE, BLOOD (ROUTINE X 2): Culture: NO GROWTH

## 2024-08-25 ENCOUNTER — Emergency Department (HOSPITAL_COMMUNITY)

## 2024-08-25 ENCOUNTER — Other Ambulatory Visit: Payer: Self-pay

## 2024-08-25 ENCOUNTER — Encounter (HOSPITAL_COMMUNITY): Payer: Self-pay

## 2024-08-25 ENCOUNTER — Inpatient Hospital Stay (HOSPITAL_COMMUNITY)
Admission: EM | Admit: 2024-08-25 | Discharge: 2024-08-30 | DRG: 064 | Disposition: A | Discharge status: Rehabilitation Facility | Attending: Internal Medicine | Admitting: Internal Medicine

## 2024-08-25 DIAGNOSIS — I6381 Other cerebral infarction due to occlusion or stenosis of small artery: Principal | ICD-10-CM | POA: Diagnosis present

## 2024-08-25 DIAGNOSIS — M545 Low back pain, unspecified: Secondary | ICD-10-CM | POA: Diagnosis present

## 2024-08-25 DIAGNOSIS — R1312 Dysphagia, oropharyngeal phase: Secondary | ICD-10-CM | POA: Diagnosis present

## 2024-08-25 DIAGNOSIS — F1410 Cocaine abuse, uncomplicated: Secondary | ICD-10-CM | POA: Diagnosis present

## 2024-08-25 DIAGNOSIS — G5603 Carpal tunnel syndrome, bilateral upper limbs: Secondary | ICD-10-CM | POA: Diagnosis present

## 2024-08-25 DIAGNOSIS — F1411 Cocaine abuse, in remission: Secondary | ICD-10-CM | POA: Diagnosis not present

## 2024-08-25 DIAGNOSIS — I6389 Other cerebral infarction: Secondary | ICD-10-CM | POA: Diagnosis not present

## 2024-08-25 DIAGNOSIS — I739 Peripheral vascular disease, unspecified: Secondary | ICD-10-CM | POA: Diagnosis not present

## 2024-08-25 DIAGNOSIS — M961 Postlaminectomy syndrome, not elsewhere classified: Secondary | ICD-10-CM | POA: Diagnosis not present

## 2024-08-25 DIAGNOSIS — Z720 Tobacco use: Secondary | ICD-10-CM | POA: Diagnosis present

## 2024-08-25 DIAGNOSIS — G8191 Hemiplegia, unspecified affecting right dominant side: Secondary | ICD-10-CM | POA: Diagnosis present

## 2024-08-25 DIAGNOSIS — I129 Hypertensive chronic kidney disease with stage 1 through stage 4 chronic kidney disease, or unspecified chronic kidney disease: Secondary | ICD-10-CM | POA: Diagnosis present

## 2024-08-25 DIAGNOSIS — I639 Cerebral infarction, unspecified: Secondary | ICD-10-CM | POA: Diagnosis present

## 2024-08-25 DIAGNOSIS — I69322 Dysarthria following cerebral infarction: Secondary | ICD-10-CM | POA: Diagnosis not present

## 2024-08-25 DIAGNOSIS — I6523 Occlusion and stenosis of bilateral carotid arteries: Secondary | ICD-10-CM | POA: Diagnosis present

## 2024-08-25 DIAGNOSIS — R1310 Dysphagia, unspecified: Secondary | ICD-10-CM | POA: Diagnosis not present

## 2024-08-25 DIAGNOSIS — I69319 Unspecified symptoms and signs involving cognitive functions following cerebral infarction: Secondary | ICD-10-CM | POA: Diagnosis not present

## 2024-08-25 DIAGNOSIS — N1832 Chronic kidney disease, stage 3b: Secondary | ICD-10-CM | POA: Diagnosis present

## 2024-08-25 DIAGNOSIS — G9341 Metabolic encephalopathy: Secondary | ICD-10-CM | POA: Diagnosis present

## 2024-08-25 DIAGNOSIS — R471 Dysarthria and anarthria: Secondary | ICD-10-CM | POA: Diagnosis present

## 2024-08-25 DIAGNOSIS — Z91148 Patient's other noncompliance with medication regimen for other reason: Secondary | ICD-10-CM | POA: Diagnosis not present

## 2024-08-25 DIAGNOSIS — G8929 Other chronic pain: Secondary | ICD-10-CM | POA: Diagnosis present

## 2024-08-25 DIAGNOSIS — T45526A Underdosing of antithrombotic drugs, initial encounter: Secondary | ICD-10-CM | POA: Diagnosis not present

## 2024-08-25 DIAGNOSIS — R29810 Facial weakness: Secondary | ICD-10-CM | POA: Diagnosis present

## 2024-08-25 DIAGNOSIS — F17210 Nicotine dependence, cigarettes, uncomplicated: Secondary | ICD-10-CM | POA: Diagnosis present

## 2024-08-25 DIAGNOSIS — Z79899 Other long term (current) drug therapy: Secondary | ICD-10-CM | POA: Diagnosis not present

## 2024-08-25 DIAGNOSIS — B379 Candidiasis, unspecified: Secondary | ICD-10-CM | POA: Diagnosis present

## 2024-08-25 DIAGNOSIS — E785 Hyperlipidemia, unspecified: Secondary | ICD-10-CM | POA: Diagnosis present

## 2024-08-25 DIAGNOSIS — I10 Essential (primary) hypertension: Secondary | ICD-10-CM | POA: Diagnosis not present

## 2024-08-25 DIAGNOSIS — K148 Other diseases of tongue: Secondary | ICD-10-CM | POA: Diagnosis present

## 2024-08-25 DIAGNOSIS — I771 Stricture of artery: Secondary | ICD-10-CM | POA: Diagnosis present

## 2024-08-25 DIAGNOSIS — I160 Hypertensive urgency: Secondary | ICD-10-CM | POA: Diagnosis present

## 2024-08-25 DIAGNOSIS — M6281 Muscle weakness (generalized): Secondary | ICD-10-CM | POA: Diagnosis present

## 2024-08-25 DIAGNOSIS — M109 Gout, unspecified: Secondary | ICD-10-CM | POA: Diagnosis present

## 2024-08-25 DIAGNOSIS — N183 Chronic kidney disease, stage 3 unspecified: Secondary | ICD-10-CM | POA: Diagnosis present

## 2024-08-25 DIAGNOSIS — Z716 Tobacco abuse counseling: Secondary | ICD-10-CM

## 2024-08-25 DIAGNOSIS — I7090 Unspecified atherosclerosis: Secondary | ICD-10-CM | POA: Diagnosis not present

## 2024-08-25 DIAGNOSIS — K219 Gastro-esophageal reflux disease without esophagitis: Secondary | ICD-10-CM | POA: Diagnosis present

## 2024-08-25 DIAGNOSIS — Z8673 Personal history of transient ischemic attack (TIA), and cerebral infarction without residual deficits: Secondary | ICD-10-CM

## 2024-08-25 DIAGNOSIS — M549 Dorsalgia, unspecified: Secondary | ICD-10-CM | POA: Diagnosis present

## 2024-08-25 DIAGNOSIS — Z7902 Long term (current) use of antithrombotics/antiplatelets: Secondary | ICD-10-CM

## 2024-08-25 DIAGNOSIS — Z7982 Long term (current) use of aspirin: Secondary | ICD-10-CM

## 2024-08-25 DIAGNOSIS — Z7951 Long term (current) use of inhaled steroids: Secondary | ICD-10-CM | POA: Diagnosis not present

## 2024-08-25 DIAGNOSIS — F1990 Other psychoactive substance use, unspecified, uncomplicated: Secondary | ICD-10-CM | POA: Diagnosis present

## 2024-08-25 DIAGNOSIS — I69351 Hemiplegia and hemiparesis following cerebral infarction affecting right dominant side: Secondary | ICD-10-CM | POA: Diagnosis present

## 2024-08-25 DIAGNOSIS — M62838 Other muscle spasm: Secondary | ICD-10-CM | POA: Diagnosis present

## 2024-08-25 DIAGNOSIS — F419 Anxiety disorder, unspecified: Secondary | ICD-10-CM | POA: Diagnosis present

## 2024-08-25 DIAGNOSIS — Z8711 Personal history of peptic ulcer disease: Secondary | ICD-10-CM

## 2024-08-25 DIAGNOSIS — J42 Unspecified chronic bronchitis: Secondary | ICD-10-CM | POA: Diagnosis not present

## 2024-08-25 DIAGNOSIS — J449 Chronic obstructive pulmonary disease, unspecified: Secondary | ICD-10-CM | POA: Diagnosis present

## 2024-08-25 DIAGNOSIS — I69392 Facial weakness following cerebral infarction: Secondary | ICD-10-CM | POA: Diagnosis not present

## 2024-08-25 LAB — DIFFERENTIAL
Abs Immature Granulocytes: 0.01 K/uL (ref 0.00–0.07)
Basophils Absolute: 0 K/uL (ref 0.0–0.1)
Basophils Relative: 0 %
Eosinophils Absolute: 0.1 K/uL (ref 0.0–0.5)
Eosinophils Relative: 1 %
Immature Granulocytes: 0 %
Lymphocytes Relative: 32 %
Lymphs Abs: 1.8 K/uL (ref 0.7–4.0)
Monocytes Absolute: 0.9 K/uL (ref 0.1–1.0)
Monocytes Relative: 17 %
Neutro Abs: 2.7 K/uL (ref 1.7–7.7)
Neutrophils Relative %: 50 %

## 2024-08-25 LAB — COMPREHENSIVE METABOLIC PANEL WITH GFR
ALT: 20 U/L (ref 0–44)
AST: 18 U/L (ref 15–41)
Albumin: 3.3 g/dL — ABNORMAL LOW (ref 3.5–5.0)
Alkaline Phosphatase: 57 U/L (ref 38–126)
Anion gap: 10 (ref 5–15)
BUN: 26 mg/dL — ABNORMAL HIGH (ref 8–23)
CO2: 23 mmol/L (ref 22–32)
Calcium: 9 mg/dL (ref 8.9–10.3)
Chloride: 106 mmol/L (ref 98–111)
Creatinine, Ser: 1.64 mg/dL — ABNORMAL HIGH (ref 0.44–1.00)
GFR, Estimated: 35 mL/min — ABNORMAL LOW (ref >60)
Glucose, Bld: 114 mg/dL — ABNORMAL HIGH (ref 70–99)
Potassium: 3.8 mmol/L (ref 3.5–5.1)
Sodium: 139 mmol/L (ref 135–145)
Total Bilirubin: 0.3 mg/dL (ref 0.0–1.2)
Total Protein: 6.9 g/dL (ref 6.5–8.1)

## 2024-08-25 LAB — I-STAT CHEM 8, ED
BUN: 28 mg/dL — ABNORMAL HIGH (ref 8–23)
Calcium, Ion: 1.15 mmol/L (ref 1.15–1.40)
Chloride: 106 mmol/L (ref 98–111)
Creatinine, Ser: 1.8 mg/dL — ABNORMAL HIGH (ref 0.44–1.00)
Glucose, Bld: 115 mg/dL — ABNORMAL HIGH (ref 70–99)
HCT: 42 % (ref 36.0–46.0)
Hemoglobin: 14.3 g/dL (ref 12.0–15.0)
Potassium: 3.8 mmol/L (ref 3.5–5.1)
Sodium: 141 mmol/L (ref 135–145)
TCO2: 23 mmol/L (ref 22–32)

## 2024-08-25 LAB — CBG MONITORING, ED: Glucose-Capillary: 194 mg/dL — ABNORMAL HIGH (ref 70–99)

## 2024-08-25 LAB — URINALYSIS, ROUTINE W REFLEX MICROSCOPIC
Bilirubin Urine: NEGATIVE
Glucose, UA: NEGATIVE mg/dL
Ketones, ur: NEGATIVE mg/dL
Leukocytes,Ua: NEGATIVE
Nitrite: NEGATIVE
Protein, ur: 30 mg/dL — AB
Specific Gravity, Urine: 1.017 (ref 1.005–1.030)
pH: 5 (ref 5.0–8.0)

## 2024-08-25 LAB — CBC
HCT: 42.3 % (ref 36.0–46.0)
Hemoglobin: 13.6 g/dL (ref 12.0–15.0)
MCH: 28.9 pg (ref 26.0–34.0)
MCHC: 32.2 g/dL (ref 30.0–36.0)
MCV: 89.8 fL (ref 80.0–100.0)
Platelets: 221 K/uL (ref 150–400)
RBC: 4.71 MIL/uL (ref 3.87–5.11)
RDW: 15.5 % (ref 11.5–15.5)
WBC: 5.5 K/uL (ref 4.0–10.5)
nRBC: 0 % (ref 0.0–0.2)

## 2024-08-25 LAB — ETHANOL: Alcohol, Ethyl (B): <15 mg/dL (ref <15)

## 2024-08-25 LAB — RAPID URINE DRUG SCREEN, HOSP PERFORMED
Amphetamines: NOT DETECTED
Barbiturates: NOT DETECTED
Benzodiazepines: NOT DETECTED
Cocaine: POSITIVE — AB
Opiates: NOT DETECTED
Tetrahydrocannabinol: NOT DETECTED

## 2024-08-25 LAB — APTT: aPTT: 30 s (ref 24–36)

## 2024-08-25 LAB — PROTIME-INR
INR: 1.1 (ref 0.8–1.2)
Prothrombin Time: 14.5 s (ref 11.4–15.2)

## 2024-08-25 MED ORDER — DILTIAZEM HCL 25 MG/5ML IV SOLN
20.0000 mg | Freq: Once | INTRAVENOUS | Status: AC
  Administered 2024-08-25: 20 mg via INTRAVENOUS
  Filled 2024-08-25: qty 5

## 2024-08-25 MED ORDER — HYDRALAZINE HCL 50 MG PO TABS
100.0000 mg | ORAL_TABLET | Freq: Two times a day (BID) | ORAL | Status: DC
Start: 2024-08-26 — End: 2024-08-26
  Administered 2024-08-26 (×2): 100 mg via ORAL
  Filled 2024-08-25 (×2): qty 2

## 2024-08-25 MED ORDER — CLOPIDOGREL BISULFATE 75 MG PO TABS
75.0000 mg | ORAL_TABLET | Freq: Every day | ORAL | Status: DC
  Administered 2024-08-26 – 2024-08-30 (×5): 75 mg via ORAL
  Filled 2024-08-25 (×5): qty 1

## 2024-08-25 MED ORDER — ATORVASTATIN CALCIUM 40 MG PO TABS
40.0000 mg | ORAL_TABLET | Freq: Every day | ORAL | Status: DC
  Administered 2024-08-26: 40 mg via ORAL
  Filled 2024-08-25: qty 1

## 2024-08-25 MED ORDER — SENNOSIDES-DOCUSATE SODIUM 8.6-50 MG PO TABS: 1.0000 | ORAL_TABLET | Freq: Every evening | ORAL | Status: DC | PRN

## 2024-08-25 MED ORDER — LABETALOL HCL 5 MG/ML IV SOLN
10.0000 mg | Freq: Once | INTRAVENOUS | Status: AC
  Administered 2024-08-25: 10 mg via INTRAVENOUS

## 2024-08-25 MED ORDER — ACETAMINOPHEN 325 MG PO TABS
650.0000 mg | ORAL_TABLET | ORAL | Status: DC | PRN
  Administered 2024-08-26 – 2024-08-29 (×2): 650 mg via ORAL
  Filled 2024-08-25 (×2): qty 2

## 2024-08-25 MED ORDER — ENOXAPARIN SODIUM 40 MG/0.4ML IJ SOSY
40.0000 mg | PREFILLED_SYRINGE | Freq: Every day | INTRAMUSCULAR | Status: DC
Start: 2024-08-26 — End: 2024-08-30
  Administered 2024-08-26 – 2024-08-30 (×5): 40 mg via SUBCUTANEOUS
  Filled 2024-08-25 (×5): qty 0.4

## 2024-08-25 MED ORDER — SODIUM CHLORIDE 0.9 % IV SOLN: INTRAVENOUS | Status: DC

## 2024-08-25 MED ORDER — STROKE: EARLY STAGES OF RECOVERY BOOK
Freq: Once | Status: AC
  Administered 2024-08-26: 1
  Filled 2024-08-25: qty 1

## 2024-08-25 MED ORDER — ASPIRIN 81 MG PO TBEC: 81.0000 mg | DELAYED_RELEASE_TABLET | Freq: Every day | ORAL | Status: DC

## 2024-08-25 MED ORDER — ACETAMINOPHEN 650 MG RE SUPP: 650.0000 mg | RECTAL | Status: DC | PRN

## 2024-08-25 MED ORDER — ACETAMINOPHEN 160 MG/5ML PO SOLN: 650.0000 mg | ORAL | Status: DC | PRN

## 2024-08-25 MED ORDER — CLOPIDOGREL BISULFATE 75 MG PO TABS
75.0000 mg | ORAL_TABLET | Freq: Every day | ORAL | Status: DC
  Administered 2024-08-25: 75 mg via ORAL
  Filled 2024-08-25: qty 1

## 2024-08-25 MED ORDER — LABETALOL HCL 5 MG/ML IV SOLN
10.0000 mg | Freq: Once | INTRAVENOUS | Status: AC
  Administered 2024-08-25: 10 mg via INTRAVENOUS
  Filled 2024-08-25: qty 4

## 2024-08-25 MED ORDER — ASPIRIN 81 MG PO TBEC
81.0000 mg | DELAYED_RELEASE_TABLET | Freq: Every day | ORAL | Status: DC
  Administered 2024-08-25 – 2024-08-30 (×6): 81 mg via ORAL
  Filled 2024-08-25 (×6): qty 1

## 2024-08-25 NOTE — ED Triage Notes (Addendum)
 Pt arrives via POV. Pt presents to the ED with slurred speech, right sided weakness and a facial droop. PT states she woke up this morning around 0500 with the symptoms. She is unsure what time she went to bed last night. Pt is VAN negative. She is on plavix . BP is 226/122 during triage. She is AxOx4. Denies headache or dizziness.

## 2024-08-25 NOTE — ED Provider Notes (Signed)
 [place] EMERGENCY DEPARTMENT AT [facility] Provider Note   CSN: [id number] Arrival date & time: 08/25/24  1621     Patient presents with: Stroke Symptoms   Deborah Mcmahon is a 63 y.o. female.   64 yo female presents from home with right side weakness. Patient went to bed at unknown time it was dark out, woke up in the middle of the night with symptoms but went back to bed. Found to have weakness in right arm and leg with facial weakness and dysarthria.  History of HTN, CVA 10/2020, CKD       Prior to Admission medications   Medication Sig Start Date End Date Taking? Authorizing Provider  acetaminophen  (TYLENOL ) 500 MG tablet Take 2 tablets (1,000 mg total) by mouth every 6 (six) hours as needed. 06/19/23   Enedelia Dorna HERO, FNP  albuterol  (VENTOLIN  HFA) 108 (90 Base) MCG/ACT inhaler Inhale 2 puffs into the lungs every 6 (six) hours as needed for wheezing or shortness of breath.    [provider]  allopurinol  (ZYLOPRIM ) 100 MG tablet Take 100 mg by mouth daily. 12/27/20   [provider]  amLODipine  (NORVASC ) 10 MG tablet Take 1 tablet (10 mg total) by mouth daily. 11/17/20   [name], MD  atorvastatin  (LIPITOR) 40 MG tablet Take 1 tablet (40 mg total) by mouth daily. Patient not taking: Reported on 05/09/2022 11/17/20   [name], MD  baclofen  (LIORESAL ) 10 MG tablet Take 1 tablet (10 mg total) by mouth 3 (three) times daily. 06/19/23   Enedelia Dorna HERO, FNP  buPROPion  (WELLBUTRIN  XL) 150 MG 24 hr tablet Take 150 mg by mouth every morning. Patient not taking: Reported on 05/09/2022 11/29/20   [provider]  carvedilol  (COREG ) 6.25 MG tablet Take 1 tablet (6.25 mg total) by mouth 2 (two) times daily. Please call [phone] to schedule an appointment for future refills. Thank you. 1st attempt. 11/24/23   Swinyer, Rosaline HERO, NP  clopidogrel  (PLAVIX ) 75 MG tablet Take 75 mg by mouth daily. 04/08/22   [provider]   fluticasone  (FLONASE ) 50 MCG/ACT nasal spray Place 2 sprays into both nostrils daily. 04/19/22   Cobb, Comer GAILS, NP  fluticasone  furoate-vilanterol (BREO ELLIPTA ) 100-25 MCG/ACT AEPB Inhale 1 puff into the lungs daily. 04/19/22   Cobb, Comer GAILS, NP  furosemide  (LASIX ) 20 MG tablet Take 1 tablet (20 mg total) by mouth daily. 03/21/23   Swinyer, Rosaline HERO, NP  hydrALAZINE (APRESOLINE) 100 MG tablet Take 100 mg by mouth 2 (two) times daily. 04/08/22   [provider]  methocarbamol (ROBAXIN) 750 MG tablet Take 750 mg by mouth 2 (two) times daily as needed for muscle spasms.    [provider]  metoprolol succinate (TOPROL-XL) 25 MG 24 hr tablet Take 25 mg by mouth every morning. 06/10/22   [provider]  omega-3 acid ethyl esters (LOVAZA) 1 g capsule Take 1 capsule by mouth daily. 04/08/22   [provider]  pantoprazole  (PROTONIX ) 40 MG tablet Take 40 mg by mouth daily. 04/08/22   [provider]  potassium chloride  (KLOR-CON  M) 10 MEQ tablet Take 1 tablet (10 mEq total) by mouth daily. 05/07/23 05/06/24  Swinyer, Rosaline HERO, NP  Prenatal Vit-Fe Fumarate-FA (M-NATAL PLUS) 27-1 MG TABS Take 1 tablet by mouth every morning. 03/15/22   [provider]  sucralfate (CARAFATE) 1 g tablet Take 1 g by mouth 4 (four) times daily. 12/14/20   [provider]  Vitamin D,  Ergocalciferol, (DRISDOL) 1.25 MG (50000 UNIT) CAPS capsule Take 50,000 Units by mouth every 14 (fourteen) days. 03/15/22   [provider]    Allergies: Patient has no known allergies.    Review of Systems Negative except as per HPI Updated Vital Signs BP (!) 159/84   Pulse 74   Temp 98.5 F (36.9 C) (Oral)   Resp 15   SpO2 100%   Physical Exam Vitals and nursing note reviewed.  Constitutional:      General: She is not in acute distress.    Appearance: She is well-developed. She is not diaphoretic.  HENT:     Head: Normocephalic and atraumatic.      Mouth/Throat:     Mouth: Mucous membranes are moist.  Eyes:     Extraocular Movements: Extraocular movements intact.     Pupils: Pupils are equal, round, and reactive to light.  Cardiovascular:     Rate and Rhythm: Normal rate and regular rhythm.     Heart sounds: Normal heart sounds.  Pulmonary:     Effort: Pulmonary effort is normal.     Breath sounds: Normal breath sounds.  Abdominal:     Palpations: Abdomen is soft.     Tenderness: There is no abdominal tenderness.  Musculoskeletal:     Cervical back: Neck supple.     Right lower leg: No edema.     Left lower leg: No edema.  Skin:    General: Skin is warm and dry.     Findings: No erythema or rash.  Neurological:     Mental Status: She is alert and oriented to person, place, and time.     Cranial Nerves: Dysarthria and facial asymmetry present.     Sensory: Sensation is intact. No sensory deficit.     Motor: Weakness present.     Coordination: Heel to [facility] Test abnormal.     Gait: Gait abnormal.     Comments: Right arm and leg weakness, is able to hold against gravity. No sensory loss Heel to shin weak with right leg  Psychiatric:        Behavior: Behavior normal.     (all labs ordered are listed, but only abnormal results are displayed) Labs Reviewed  COMPREHENSIVE METABOLIC PANEL WITH GFR - Abnormal; Notable for the following components:      Result Value   Glucose, Bld 114 (*)    BUN 26 (*)    Creatinine, Ser 1.64 (*)    Albumin 3.3 (*)    GFR, Estimated 35 (*)    All other components within normal limits  RAPID URINE DRUG SCREEN, HOSP PERFORMED - Abnormal; Notable for the following components:   Cocaine POSITIVE (*)    All other components within normal limits  URINALYSIS, ROUTINE W REFLEX MICROSCOPIC - Abnormal; Notable for the following components:   APPearance HAZY (*)    Hgb urine dipstick SMALL (*)    Protein, ur 30 (*)    Bacteria, UA RARE (*)    All other components within normal limits  I-STAT  CHEM 8, ED - Abnormal; Notable for the following components:   BUN 28 (*)    Creatinine, Ser 1.80 (*)    Glucose, Bld 115 (*)    All other components within normal limits  CBG MONITORING, ED - Abnormal; Notable for the following components:   Glucose-Capillary 194 (*)    All other components within normal limits  ETHANOL  PROTIME-INR  APTT  CBC  DIFFERENTIAL    EKG:  None  Radiology: MR BRAIN WO CONTRAST Result Date: 08/25/2024 EXAM: MRI BRAIN WITHOUT CONTRAST 08/25/2024 06:36:45 PM TECHNIQUE: Multiplanar multisequence MRI of the head/brain was performed without the administration of intravenous contrast. COMPARISON: Earlier same day CT head. CLINICAL HISTORY: Neuro deficit, acute, stroke suspected. FINDINGS: BRAIN AND VENTRICLES: Restricted diffusion in the left basal ganglia involving the posterior body and tail of the caudate nucleus extending into the posterior limb of the internal capsule compatible with acute infarcts. There is encephalomalacia in the left cerebellum compatible with remote infarct. Scattered T2 FLAIR hyperintensity in the periventricular and subcortical white matter with additional signal abnormality in the thalami and pons suggestive of moderate chronic microvascular ischemic changes. Chronic microhemorrhages in the right thalamus. Remote lacunar infarcts in the bilateral basal ganglia. No acute intracranial hemorrhage. No mass. No midline shift. No hydrocephalus. The sella is unremarkable. Normal flow voids. ORBITS: No acute abnormality. SINUSES AND MASTOIDS: Mucosal thickening in the right sphenoid sinus. BONES AND SOFT TISSUES: Normal marrow signal. No acute soft tissue abnormality. IMPRESSION: 1. Acute infarcts in the left basal ganglia as above. 2. 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 3. Remote lacunar infarcts in the bilateral basal ganglia. 4. Moderate chronic microvascular ischemic changes. Electronically signed by: Donnice Mania MD 08/25/2024 07:35 PM EDT RP  Workstation: [id number]   CT HEAD WO CONTRAST Result Date: 08/25/2024 CLINICAL DATA:  Right-sided weakness facial droop EXAM: CT HEAD WITHOUT CONTRAST TECHNIQUE: Contiguous axial images were obtained from the base of the skull through the vertex without intravenous contrast. RADIATION DOSE REDUCTION: This exam was performed according to the departmental dose-optimization program which includes automated exposure control, adjustment of the mA and/or kV according to patient size and/or use of iterative reconstruction technique. COMPARISON:  CT brain 01/20/2024 MRI 11/15/2020 FINDINGS: Brain: No acute territorial infarction, hemorrhage or intracranial mass. Chronic left cerebellar infarct. Widespread bilateral white matter hypodensity most likely chronic small vessel ischemic change. The ventricles are nonenlarged Vascular: No hyperdense vessels.  Carotid vascular calcification Skull: Normal. Negative for fracture or focal lesion. Sinuses/Orbits: No acute finding. Other: None IMPRESSION: 1. No CT evidence for acute intracranial abnormality. 2. Chronic left cerebellar infarct. Widespread bilateral white matter hypodensity most likely chronic small vessel ischemic change. Electronically Signed   By: Luke Bun M.D.   On: 08/25/2024 17:13     .Critical Care  Performed by: Beverley Leita LABOR, PA-C Authorized by: Beverley Leita LABOR, PA-C   Critical care provider statement:    Critical care time (minutes):  90   Critical care was time spent personally by me on the following activities:  Development of treatment plan with patient or surrogate, discussions with consultants, evaluation of patient's response to treatment, examination of patient, ordering and review of laboratory studies, ordering and review of radiographic studies, ordering and performing treatments and interventions, pulse oximetry, re-evaluation of patient's condition and review of old charts    Medications Ordered in the ED  labetalol   (NORMODYNE ) injection 10 mg (10 mg Intravenous Given 08/25/24 1727)  labetalol  (NORMODYNE ) injection 10 mg (10 mg Intravenous Given 08/25/24 1956)  diltiazem (CARDIZEM) injection 20 mg (20 mg Intravenous Given 08/25/24 2115)                                    Medical Decision Making Amount and/or Complexity of Data Reviewed Labs: ordered. Radiology: ordered.  Risk Prescription drug management. Decision regarding hospitalization.   This patient presents to  the ED for concern of right side weakness, dysarthria, this involves an extensive number of treatment options, and is a complaint that carries with it a high risk of complications and morbidity.  The differential diagnosis includes but not limited to CVA, UTI, hypertensive emergency    Co morbidities / Chronic conditions that complicate the patient evaluation  HTN, CVA, CKD   Additional history obtained:  Additional history obtained from EMR External records from outside source obtained and reviewed including prior labs and imaging    Lab Tests:  I Ordered, and personally interpreted labs.  The pertinent results include: CBC within normal limits.  CMP with mild increase in creatinine to 1.64.  Urinalysis with small Mehta hemoglobin and protein, negative for nitrates and leukocytes.  INR normal.  UDS positive for cocaine.  Alcohol  negative.   Imaging Studies ordered:  I ordered imaging studies including CT head, MRI brain I independently visualized and interpreted imaging which showed CT negative for CVA, MRI positive for CVA I agree with the radiologist interpretation   Cardiac Monitoring: / EKG:  The patient was maintained on a cardiac monitor.  I personally viewed and interpreted the cardiac monitored which showed an underlying rhythm of: Normal sinus rhythm, rate 79   Problem List / ED Course / Critical interventions / Medication management  63 year old female arrives from home with last known well of greater than  12 hours ago with complaint of right arm and leg weakness and dysarthria.  Patient is unable to walk due to her right leg weakness.  Sensation is intact.  CT head negative for acute stroke.  Patient was significantly hypertensive on arrival with blood pressure of 226/122.  She had blood pressure rechecked at 264/108 and was provided with labetalol .  Blood pressure improved to 206/97. BP elevated and again greater than 220 systolic provided with 2nd dose of labetalol  with limited improvement.  Provided with dose of Cardizem, with improvement in BP. Discussed with hospitalist who will admit.  I ordered medication including labetalol  Reevaluation of the patient after these medicines showed that the patient remained stable I have reviewed the patients home medicines and have made adjustments as needed   Consultations Obtained:  I requested consultation with the Dr. Merrianne with neurology,  and discussed lab and imaging findings as well as pertinent plan - they recommend: has seen the patient. Case discussed with Dr. Sim with [facility] hospital[facility] service who will consult for admission.   Social Determinants of Health:  Lives with family   Test / Admission - Considered:  Admit      Final diagnoses:  Acute ischemic stroke [facility])    ED Discharge Orders     None          Beverley Leita DELENA DEVONNA 08/25/24 2156    Lowther, Amy, DO 08/26/24 [id number]

## 2024-08-25 NOTE — ED Notes (Signed)
 Patient transported to MRI

## 2024-08-25 NOTE — ED Notes (Signed)
PT to CT with this RN.

## 2024-08-25 NOTE — ED Notes (Signed)
 Patient was able to swallow water without choking or stopping

## 2024-08-25 NOTE — Consult Note (Addendum)
 NEUROLOGY CONSULT NOTE   Date of service: August 25, 2024 Patient Name: Deborah Mcmahon MRN:  [id number] DOB:  1961/06/04 Chief Complaint: Right sided weakness Requesting Provider: Corinthia No, DO  History of Present Illness  DARRELLE BARRELL is a 63 y.o. female with hx of bilateral carpal tunnel syndrome, migraine, HTN, prior silent stroke (left cerebellar) and nocturia who presents to the ED with new onset of slurred speech, right sided weakness and facial droop. She woke up this morning around 0500 with the symptoms. She cannot recall what time she went to bed last night. BP was 226/122 during triage. She has no headache, dizziness, vision change or speech deficit other than her dysarthria.    ROS  Comprehensive ROS performed and pertinent positives documented in HPI    Past History   Past Medical History:  Diagnosis Date   Carpal tunnel syndrome    bilateral   Chronic back pain    spondylolisthesis   Constipation    takes an OTC stool softener daily as needed   Gallstones    History of migraine    Hypertension    Nausea and vomiting 01/20/2024   Nocturia    Weakness    tingling     Past Surgical History:  Procedure Laterality Date   BACK SURGERY     fusion   BIOPSY  12/31/2020   Procedure: BIOPSY;  Surgeon: Wilhelmenia Aloha Raddle., MD;  Location: [facility] ENDOSCOPY;  Service: Gastroenterology;;   CERVICAL SPINE SURGERY     x 4-fusion   COLONOSCOPY WITH PROPOFOL  N/A 12/31/2020   Procedure: COLONOSCOPY WITH PROPOFOL ;  Surgeon: Wilhelmenia Aloha Raddle., MD;  Location: [facility] ENDOSCOPY;  Service: Gastroenterology;  Laterality: N/A;   ESOPHAGOGASTRODUODENOSCOPY (EGD) WITH PROPOFOL  N/A 12/31/2020   Procedure: ESOPHAGOGASTRODUODENOSCOPY (EGD) WITH PROPOFOL ;  Surgeon: Wilhelmenia Aloha Raddle., MD;  Location: [facility] ENDOSCOPY;  Service: Gastroenterology;  Laterality: N/A;   MYRINGOTOMY     POLYPECTOMY  12/31/2020   Procedure: POLYPECTOMY;  Surgeon: Mansouraty, Aloha Raddle., MD;  Location: [facility]  ENDOSCOPY;  Service: Gastroenterology;;   right knee surgery     screws   TONSILLECTOMY AND ADENOIDECTOMY     TUBAL LIGATION      Family History: Family History  Problem Relation Age of Onset   Anesthesia problems Neg Hx     Social History  reports that she has been smoking cigarettes. She has a 20.5 pack-year smoking history. She has never used smokeless tobacco. She reports that she does not drink alcohol  and does not use drugs.  No Known Allergies  Medications  No current facility-administered medications for this encounter.  Current Outpatient Medications:    acetaminophen  (TYLENOL ) 500 MG tablet, Take 2 tablets (1,000 mg total) by mouth every 6 (six) hours as needed., Disp: 30 tablet, Rfl: 0   albuterol  (VENTOLIN  HFA) 108 (90 Base) MCG/ACT inhaler, Inhale 2 puffs into the lungs every 6 (six) hours as needed for wheezing or shortness of breath., Disp: , Rfl:    allopurinol  (ZYLOPRIM ) 100 MG tablet, Take 100 mg by mouth daily., Disp: , Rfl:    amLODipine  (NORVASC ) 10 MG tablet, Take 1 tablet (10 mg total) by mouth daily., Disp: 30 tablet, Rfl: 3   atorvastatin  (LIPITOR) 40 MG tablet, Take 1 tablet (40 mg total) by mouth daily. (Patient not taking: Reported on 05/09/2022), Disp: 90 tablet, Rfl: 3   baclofen  (LIORESAL ) 10 MG tablet, Take 1 tablet (10 mg total) by mouth 3 (three) times daily., Disp: 30 each, Rfl: 0  buPROPion  (WELLBUTRIN  XL) 150 MG 24 hr tablet, Take 150 mg by mouth every morning. (Patient not taking: Reported on 05/09/2022), Disp: , Rfl:    carvedilol  (COREG ) 6.25 MG tablet, Take 1 tablet (6.25 mg total) by mouth 2 (two) times daily. Please call [phone] to schedule an appointment for future refills. Thank you. 1st attempt., Disp: 60 tablet, Rfl: 0   clopidogrel  (PLAVIX ) 75 MG tablet, Take 75 mg by mouth daily., Disp: , Rfl:    fluticasone  (FLONASE ) 50 MCG/ACT nasal spray, Place 2 sprays into both nostrils daily., Disp: 16 g, Rfl: 2   fluticasone   furoate-vilanterol (BREO ELLIPTA ) 100-25 MCG/ACT AEPB, Inhale 1 puff into the lungs daily., Disp: 60 each, Rfl: 0   furosemide  (LASIX ) 20 MG tablet, Take 1 tablet (20 mg total) by mouth daily., Disp: 90 tablet, Rfl: 1   hydrALAZINE (APRESOLINE) 100 MG tablet, Take 100 mg by mouth 2 (two) times daily., Disp: , Rfl:    methocarbamol (ROBAXIN) 750 MG tablet, Take 750 mg by mouth 2 (two) times daily as needed for muscle spasms., Disp: , Rfl:    metoprolol succinate (TOPROL-XL) 25 MG 24 hr tablet, Take 25 mg by mouth every morning., Disp: , Rfl:    omega-3 acid ethyl esters (LOVAZA) 1 g capsule, Take 1 capsule by mouth daily., Disp: , Rfl:    pantoprazole  (PROTONIX ) 40 MG tablet, Take 40 mg by mouth daily., Disp: , Rfl:    potassium chloride  (KLOR-CON  M) 10 MEQ tablet, Take 1 tablet (10 mEq total) by mouth daily., Disp: 90 tablet, Rfl: 3   Prenatal Vit-Fe Fumarate-FA (M-NATAL PLUS) 27-1 MG TABS, Take 1 tablet by mouth every morning., Disp: , Rfl:    sucralfate (CARAFATE) 1 g tablet, Take 1 g by mouth 4 (four) times daily., Disp: , Rfl:    Vitamin D, Ergocalciferol, (DRISDOL) 1.25 MG (50000 UNIT) CAPS capsule, Take 50,000 Units by mouth every 14 (fourteen) days., Disp: , Rfl:   Vitals   Vitals:   09-03-2024 1629 03-Sep-2024 1730 09-03-24 1800  BP: (!) 226/122 (!) 264/108 (!) 206/97  Pulse: 84  72  Resp: (!) 23  15  Temp: 98.5 F (36.9 C)    TempSrc: Oral    SpO2: 98%  100%    There is no height or weight on file to calculate BMI.   Physical Exam   Constitutional: Appears well-developed and well-nourished.  Psych: Affect appropriate to situation.  Eyes: No scleral injection.  HENT: No OP obstruction.  Head: Normocephalic.  Respiratory: Effort normal, non-labored breathing.    Neurologic Examination   Mental Status: Awake and alert. Oriented x 5. Speech with prominent dysarthria, but fluent with intact naming and comprehension.  Cranial Nerves: II: Visual fields intact bilaterally. No  extinction to DSS.   III,IV, VI: No ptosis. EOMI. No nystagmus.  V: Temp sensation equal bilaterally VII: Right facial droop VIII: Hearing intact to conversation IX,X: No hoarseness or hypophonia XI: Symmetric XII: Midline tongue extension Motor: RUE 4/5 proximally with 3/5 grip and 2/5 finger extension and abduction.  RLE 4/5 proximally and distally.  LUE and LLE 5/5 Sensory: Temp and light touch intact throughout, bilaterally. No extinction to DSS.  Deep Tendon Reflexes: 2+ and symmetric throughout Cerebellar: No ataxia with FNF bilaterally, but slow and weak on the right Gait: Deferred  Labs/Imaging/Neurodiagnostic studies   CBC:  Recent Labs  Lab 09/03/2024 1628 09/03/2024 1640  WBC 5.5  --   NEUTROABS 2.7  --   HGB 13.6 14.3  HCT 42.3 42.0  MCV 89.8  --   PLT 221  --    Basic Metabolic Panel:  Lab Results  Component Value Date   NA 141 08/25/2024   K 3.8 08/25/2024   CO2 23 08/25/2024   GLUCOSE 115 (H) 08/25/2024   BUN 28 (H) 08/25/2024   CREATININE 1.80 (H) 08/25/2024   CALCIUM  9.0 08/25/2024   GFRNONAA 35 (L) 08/25/2024   GFRAA 64 (L) 02/17/2015   Lipid Panel:  Lab Results  Component Value Date   LDLCALC 119 (H) 11/16/2020   HgbA1c:  Lab Results  Component Value Date   HGBA1C 5.5 11/16/2020     Alcohol  Level     Component Value Date/Time   [facility] <15 08/25/2024 1631   INR  Lab Results  Component Value Date   INR 1.1 08/25/2024   APTT  Lab Results  Component Value Date   APTT 30 08/25/2024     ASSESSMENT  WAFAA DEEMER is a 63 y.o. female with a PMHx of bilateral carpal tunnel syndrome, migraine, HTN, prior silent stroke (left cerebellar) and nocturia who presents to the ED with new onset of slurred speech, right sided weakness and facial droop. She woke up this morning around 0500 with the symptoms. She cannot recall what time she went to bed last night. BP was 226/122 during triage. She has no headache, dizziness, vision change or speech  deficit other than her dysarthria.  - Exam reveals dysarthria and right facial droop, together with right arm and leg weakness - CT head: No CT evidence for acute intracranial abnormality. Chronic left cerebellar infarct. Widespread bilateral white matter hypodensity most likely chronic small vessel ischemic change.  - MRI brain: Positive for acute left basal ganglia and periventricular white matter ischemic infarction.  - EKG: Normal sinus rhythm; Possible Left atrial enlargement; Incomplete right bundle branch block; Left anterior fascicular block; Left ventricular hypertrophy ( R in aVL , [employer] product ); Anteroseptal infarct , age undetermined - Impression:  - Acute left basal ganglia and periventricular white matter ischemic infarction. DDx for underlying etiology includes chronic hypertensive microangiopathy, atherothrombotic and cardioembolic. However, given her positive cocaine result on UDS, vasospasm is also high on the DDx.  - Chronic left cerebellar ischemic infarction.   RECOMMENDATIONS  1. HgbA1c, fasting lipid panel 2. CTA of head and neck 3. PT consult, OT consult, Speech consult 4. Echocardiogram 5. Statin. Get baseline CK level.  6. Add ASA to Plavix   7. Risk factor modification 8. Telemetry monitoring 9. Frequent neuro checks 10 NPO until passes stroke swallow screen 11. BP management per standard protocol. Her severe HTN is felt to present a higher risk than extension of her stroke, which based on review of images does not appear likely to have an appreciable ischemic penumbra.  12. IVF 13. Drug cessation counseling.    ______________________________________________________________________    Bonney SHARK, Jeston Junkins, MD [facility]

## 2024-08-25 NOTE — H&P (Signed)
 History and Physical    Patient: Deborah Mcmahon FMW:[id number] DOB: June 26, 1961 DOA: 08/25/2024 DOS: the patient was seen and examined on 08/25/2024 PCP: Leontine Cramp, NP  Patient coming from: Home  Chief Complaint:  Chief Complaint  Patient presents with   Stroke Symptoms   HPI: Deborah Mcmahon is a 63 y.o. female with medical history significant of essential hypertension, hyperlipidemia, previous CVA involving the cerebellar region, migraine headaches, carpal tunnel syndrome, who presented to the ER with slurred speech, right-sided weakness and facial droop.  Patient woke up early this morning around 5 AM with the symptoms.  No recall of what happened.  She came into the ER where initial blood pressure was 226/122, no headache no dizziness no visual changes or speech deficit there.  Code stroke was called.  Patient  is outside the window for tPA  administration.  Patient has head CT without contrast that was negative but MRI of the brain was positive.  Neurology has seen the patient and recommend medical admission.  Review of Systems: As mentioned in the history of present illness. All other systems reviewed and are negative. Past Medical History:  Diagnosis Date   Carpal tunnel syndrome    bilateral   Chronic back pain    spondylolisthesis   Constipation    takes an OTC stool softener daily as needed   Gallstones    History of migraine    Hypertension    Nausea and vomiting 01/20/2024   Nocturia    Weakness    tingling    Past Surgical History:  Procedure Laterality Date   BACK SURGERY     fusion   BIOPSY  12/31/2020   Procedure: BIOPSY;  Surgeon: Wilhelmenia Aloha Raddle., MD;  Location: [facility] ENDOSCOPY;  Service: Gastroenterology;;   CERVICAL SPINE SURGERY     x 4-fusion   COLONOSCOPY WITH PROPOFOL  N/A 12/31/2020   Procedure: COLONOSCOPY WITH PROPOFOL ;  Surgeon: Wilhelmenia Aloha Raddle., MD;  Location: [facility] ENDOSCOPY;  Service: Gastroenterology;  Laterality: N/A;    ESOPHAGOGASTRODUODENOSCOPY (EGD) WITH PROPOFOL  N/A 12/31/2020   Procedure: ESOPHAGOGASTRODUODENOSCOPY (EGD) WITH PROPOFOL ;  Surgeon: Wilhelmenia Aloha Raddle., MD;  Location: [facility] ENDOSCOPY;  Service: Gastroenterology;  Laterality: N/A;   MYRINGOTOMY     POLYPECTOMY  12/31/2020   Procedure: POLYPECTOMY;  Surgeon: Mansouraty, Aloha Raddle., MD;  Location: [facility] ENDOSCOPY;  Service: Gastroenterology;;   right knee surgery     screws   TONSILLECTOMY AND ADENOIDECTOMY     TUBAL LIGATION     Social History:  reports that she has been smoking cigarettes. She has a 20.5 pack-year smoking history. She has never used smokeless tobacco. She reports that she does not drink alcohol  and does not use drugs.  No Known Allergies  Family History  Problem Relation Age of Onset   Anesthesia problems Neg Hx     Prior to Admission medications   Medication Sig Start Date End Date Taking? Authorizing Provider  acetaminophen  (TYLENOL ) 500 MG tablet Take 2 tablets (1,000 mg total) by mouth every 6 (six) hours as needed. 06/19/23   Enedelia Dorna HERO, FNP  albuterol  (VENTOLIN  HFA) 108 (90 Base) MCG/ACT inhaler Inhale 2 puffs into the lungs every 6 (six) hours as needed for wheezing or shortness of breath.    [provider]  allopurinol  (ZYLOPRIM ) 100 MG tablet Take 100 mg by mouth daily. 12/27/20   [provider]  amLODipine  (NORVASC ) 10 MG tablet Take 1 tablet (10 mg total) by mouth daily. 11/17/20   [name],  MD  atorvastatin  (LIPITOR) 40 MG tablet Take 1 tablet (40 mg total) by mouth daily. Patient not taking: Reported on 05/09/2022 11/17/20   [name], MD  baclofen  (LIORESAL ) 10 MG tablet Take 1 tablet (10 mg total) by mouth 3 (three) times daily. 06/19/23   Enedelia Dorna HERO, FNP  buPROPion  (WELLBUTRIN  XL) 150 MG 24 hr tablet Take 150 mg by mouth every morning. Patient not taking: Reported on 05/09/2022 11/29/20   [provider]  carvedilol  (COREG ) 6.25 MG tablet Take 1 tablet  (6.25 mg total) by mouth 2 (two) times daily. Please call [phone] to schedule an appointment for future refills. Thank you. 1st attempt. 11/24/23   Swinyer, Rosaline HERO, NP  clopidogrel  (PLAVIX ) 75 MG tablet Take 75 mg by mouth daily. 04/08/22   [provider]  fluticasone  (FLONASE ) 50 MCG/ACT nasal spray Place 2 sprays into both nostrils daily. 04/19/22   Cobb, Comer GAILS, NP  fluticasone  furoate-vilanterol (BREO ELLIPTA ) 100-25 MCG/ACT AEPB Inhale 1 puff into the lungs daily. 04/19/22   Cobb, Comer GAILS, NP  furosemide  (LASIX ) 20 MG tablet Take 1 tablet (20 mg total) by mouth daily. 03/21/23   Swinyer, Rosaline HERO, NP  hydrALAZINE (APRESOLINE) 100 MG tablet Take 100 mg by mouth 2 (two) times daily. 04/08/22   [provider]  methocarbamol (ROBAXIN) 750 MG tablet Take 750 mg by mouth 2 (two) times daily as needed for muscle spasms.    [provider]  metoprolol succinate (TOPROL-XL) 25 MG 24 hr tablet Take 25 mg by mouth every morning. 06/10/22   [provider]  omega-3 acid ethyl esters (LOVAZA) 1 g capsule Take 1 capsule by mouth daily. 04/08/22   [provider]  pantoprazole  (PROTONIX ) 40 MG tablet Take 40 mg by mouth daily. 04/08/22   [provider]  potassium chloride  (KLOR-CON  M) 10 MEQ tablet Take 1 tablet (10 mEq total) by mouth daily. 05/07/23 05/06/24  Swinyer, Rosaline HERO, NP  Prenatal Vit-Fe Fumarate-FA (M-NATAL PLUS) 27-1 MG TABS Take 1 tablet by mouth every morning. 03/15/22   [provider]  sucralfate (CARAFATE) 1 g tablet Take 1 g by mouth 4 (four) times daily. 12/14/20   [provider]  Vitamin D, Ergocalciferol, (DRISDOL) 1.25 MG (50000 UNIT) CAPS capsule Take 50,000 Units by mouth every 14 (fourteen) days. 03/15/22   [provider]    Physical Exam: Vitals:   08/25/24 1930 08/25/24 2000 08/25/24 2026 08/25/24 2118  BP: (!) 214/144 (!) 155/140  (!) 159/84  Pulse: 78 75  74  Resp:  19  15  Temp:    98.5 F (36.9 C)   TempSrc:   Oral   SpO2: 100% 100%  100%   Constitutional: Confused, slowed speech, NAD, calm, comfortable Eyes: PERRL, lids and conjunctivae normal ENMT: Mucous membranes are moist. Posterior pharynx clear of any exudate or lesions.Normal dentition.  Neck: normal, supple, no masses, no thyromegaly Respiratory: clear to auscultation bilaterally, no wheezing, no crackles. Normal respiratory effort. No accessory muscle use.  Cardiovascular: Regular rate and rhythm, no murmurs / rubs / gallops. No extremity edema. 2+ pedal pulses. No carotid bruits.  Abdomen: no tenderness, no masses palpated. No hepatosplenomegaly. Bowel sounds positive.  Musculoskeletal: Good range of motion, no joint swelling or tenderness, Skin: no rashes, lesions, ulcers. No induration Neurologic: Slurred, right hemiparesis Psychiatric: Normal judgment and insight. Alert and oriented x 3. Normal mood  Data Reviewed:  Temperature 98.5, blood pressure 264/108, pulse 89 respiratory rate 23 oxygen 98%  room air.  Creatinine 1.80 BUN 28 glucose 115 CBC all within normal.  Urinalysis essentially negative.  Urine drug screen is positive for cocaine.  Head CT without contrast showed no acute findings MRI of the brain showed acute infarct in the left basal ganglia and remote infarct in the left cerebellar.  Also remote lacunar infarcts in the bilateral basal ganglia with moderate chronic microvascular ischemic changes  Assessment and Plan:  #1 acute CVA: Patient will be admitted.  Workup will continue per neurology.  PT and OT consultation.  Aspirin , Plavix , statin.  #2 essential hypertension: Continue blood pressure medications.  Permissive hypertension has been initiated.  #3 tobacco abuse: Will add nicotine patch  #4 chronic kidney disease stage III: Continue to monitor  #5 cocaine abuse: Counseling will be provided.  This may have contributed to the stroke.  #6 hyperlipidemia: Continue statin     Advance Care Planning:   Code Status: Full Code   Consults: Dr. Merrianne, neurologist  Family Communication: No family at bedside  Severity of Illness: The appropriate patient status for this patient is INPATIENT. Inpatient status is judged to be reasonable and necessary in order to provide the required intensity of service to ensure the patient's safety. The patient's presenting symptoms, physical exam findings, and initial radiographic and laboratory data in the context of their chronic comorbidities is felt to place them at high risk for further clinical deterioration. Furthermore, it is not anticipated that the patient will be medically stable for discharge from the hospital within 2 midnights of admission.   * I certify that at the point of admission it is my clinical judgment that the patient will require inpatient hospital care spanning beyond 2 midnights from the point of admission due to high intensity of service, high risk for further deterioration and high frequency of surveillance required.*  AuthorBETHA SIM KNOLL, MD 08/25/2024 11:40 PM  For on call review [email or web address].

## 2024-08-26 ENCOUNTER — Encounter (HOSPITAL_COMMUNITY)

## 2024-08-26 ENCOUNTER — Inpatient Hospital Stay (HOSPITAL_COMMUNITY)

## 2024-08-26 DIAGNOSIS — F1410 Cocaine abuse, uncomplicated: Secondary | ICD-10-CM

## 2024-08-26 DIAGNOSIS — I6389 Other cerebral infarction: Secondary | ICD-10-CM | POA: Diagnosis not present

## 2024-08-26 DIAGNOSIS — I160 Hypertensive urgency: Secondary | ICD-10-CM

## 2024-08-26 DIAGNOSIS — Z91148 Patient's other noncompliance with medication regimen for other reason: Secondary | ICD-10-CM

## 2024-08-26 DIAGNOSIS — E785 Hyperlipidemia, unspecified: Secondary | ICD-10-CM

## 2024-08-26 DIAGNOSIS — I7090 Unspecified atherosclerosis: Secondary | ICD-10-CM

## 2024-08-26 DIAGNOSIS — F17210 Nicotine dependence, cigarettes, uncomplicated: Secondary | ICD-10-CM

## 2024-08-26 DIAGNOSIS — I639 Cerebral infarction, unspecified: Secondary | ICD-10-CM | POA: Diagnosis not present

## 2024-08-26 DIAGNOSIS — T45526A Underdosing of antithrombotic drugs, initial encounter: Secondary | ICD-10-CM

## 2024-08-26 DIAGNOSIS — I10 Essential (primary) hypertension: Secondary | ICD-10-CM

## 2024-08-26 DIAGNOSIS — R1310 Dysphagia, unspecified: Secondary | ICD-10-CM

## 2024-08-26 LAB — BLOOD GAS, VENOUS
Acid-base deficit: 3 mmol/L — ABNORMAL HIGH (ref 0.0–2.0)
Bicarbonate: 19.2 mmol/L — ABNORMAL LOW (ref 20.0–28.0)
O2 Saturation: 99.4 %
Patient temperature: 36.4
pCO2, Ven: 26 mmHg — ABNORMAL LOW (ref 44–60)
pH, Ven: 7.47 — ABNORMAL HIGH (ref 7.25–7.43)
pO2, Ven: 89 mmHg — ABNORMAL HIGH (ref 32–45)

## 2024-08-26 LAB — HEMOGLOBIN A1C
Hgb A1c MFr Bld: 5.1 % (ref 4.8–5.6)
Mean Plasma Glucose: 99.67 mg/dL

## 2024-08-26 LAB — ECHOCARDIOGRAM COMPLETE
Area-P 1/2: 2.76 cm2
Calc EF: 78.9 %
Est EF: >75
S' Lateral: 2.19 cm
Single Plane A2C EF: 80.6 %
Single Plane A4C EF: 77.7 %

## 2024-08-26 LAB — CBC
HCT: 40.2 % (ref 36.0–46.0)
Hemoglobin: 12.9 g/dL (ref 12.0–15.0)
MCH: 28.5 pg (ref 26.0–34.0)
MCHC: 32.1 g/dL (ref 30.0–36.0)
MCV: 88.9 fL (ref 80.0–100.0)
Platelets: 217 K/uL (ref 150–400)
RBC: 4.52 MIL/uL (ref 3.87–5.11)
RDW: 15.6 % — ABNORMAL HIGH (ref 11.5–15.5)
WBC: 6.9 K/uL (ref 4.0–10.5)
nRBC: 0 % (ref 0.0–0.2)

## 2024-08-26 LAB — VITAMIN B12: Vitamin B-12: 749 pg/mL (ref 180–914)

## 2024-08-26 LAB — LIPID PANEL
Cholesterol: 171 mg/dL (ref 0–200)
HDL: 41 mg/dL (ref >40)
LDL Cholesterol: 118 mg/dL — ABNORMAL HIGH (ref 0–99)
Total CHOL/HDL Ratio: 4.2 ratio
Triglycerides: 61 mg/dL (ref <150)
VLDL: 12 mg/dL (ref 0–40)

## 2024-08-26 LAB — FOLATE: Folate: 19.4 ng/mL (ref >5.9)

## 2024-08-26 LAB — AMMONIA: Ammonia: 27 umol/L (ref 9–35)

## 2024-08-26 LAB — CREATININE, SERUM
Creatinine, Ser: 1.37 mg/dL — ABNORMAL HIGH (ref 0.44–1.00)
GFR, Estimated: 43 mL/min — ABNORMAL LOW (ref >60)

## 2024-08-26 LAB — T4, FREE: Free T4: 1.15 ng/dL — ABNORMAL HIGH (ref 0.61–1.12)

## 2024-08-26 LAB — HIV ANTIBODY (ROUTINE TESTING W REFLEX): HIV Screen 4th Generation wRfx: NONREACTIVE

## 2024-08-26 LAB — TSH: TSH: 0.307 u[IU]/mL — ABNORMAL LOW (ref 0.350–4.500)

## 2024-08-26 MED ORDER — PNEUMOCOCCAL 20-VAL CONJ VACC 0.5 ML IM SUSY: 0.5000 mL | PREFILLED_SYRINGE | INTRAMUSCULAR | Status: DC | PRN

## 2024-08-26 MED ORDER — LABETALOL HCL 5 MG/ML IV SOLN
10.0000 mg | INTRAVENOUS | Status: DC | PRN
  Administered 2024-08-28: 10 mg via INTRAVENOUS
  Filled 2024-08-26: qty 4

## 2024-08-26 MED ORDER — CLOTRIMAZOLE 10 MG MT TROC
10.0000 mg | Freq: Every day | OROMUCOSAL | Status: DC
  Administered 2024-08-26 – 2024-08-30 (×18): 10 mg via ORAL
  Filled 2024-08-26 (×25): qty 1

## 2024-08-26 MED ORDER — LACTATED RINGERS IV BOLUS
500.0000 mL | Freq: Once | INTRAVENOUS | Status: AC
  Administered 2024-08-26: 500 mL via INTRAVENOUS

## 2024-08-26 MED ORDER — PANTOPRAZOLE SODIUM 40 MG PO TBEC
40.0000 mg | DELAYED_RELEASE_TABLET | Freq: Every day | ORAL | Status: DC
  Administered 2024-08-27 – 2024-08-30 (×4): 40 mg via ORAL
  Filled 2024-08-26 (×4): qty 1

## 2024-08-26 MED ORDER — FLUTICASONE FUROATE-VILANTEROL 100-25 MCG/ACT IN AEPB
1.0000 | INHALATION_SPRAY | Freq: Every day | RESPIRATORY_TRACT | Status: DC
  Administered 2024-08-27 – 2024-08-30 (×4): 1 via RESPIRATORY_TRACT
  Filled 2024-08-26: qty 28

## 2024-08-26 MED ORDER — ALLOPURINOL 100 MG PO TABS
100.0000 mg | ORAL_TABLET | Freq: Every day | ORAL | Status: DC
  Administered 2024-08-27 – 2024-08-30 (×4): 100 mg via ORAL
  Filled 2024-08-26 (×4): qty 1

## 2024-08-26 MED ORDER — ATORVASTATIN CALCIUM 80 MG PO TABS
80.0000 mg | ORAL_TABLET | Freq: Every day | ORAL | Status: DC
  Administered 2024-08-27 – 2024-08-30 (×4): 80 mg via ORAL
  Filled 2024-08-26 (×4): qty 1

## 2024-08-26 MED ORDER — IOHEXOL 350 MG/ML SOLN
65.0000 mL | Freq: Once | INTRAVENOUS | Status: AC | PRN
  Administered 2024-08-26: 65 mL via INTRAVENOUS

## 2024-08-26 NOTE — Plan of Care (Signed)

## 2024-08-26 NOTE — Progress Notes (Signed)
 STROKE TEAM PROGRESS NOTE   SUBJECTIVE (INTERVAL HISTORY) Her boyfriend and niece are at the bedside.  Overall her condition is stable.  Patient had some lethargy and sleepiness earlier today, but on my rounding, patient awake alert, follows some commands, still has moderate dysarthria, right facial droop and right sided weakness.  CTA head and neck showed severe stenosis bilaterally and bilateral ICA.   OBJECTIVE Temp:  [97.5 F (36.4 C)-98.9 F (37.2 C)] 97.8 F (36.6 C) (10/02 1607) Pulse Rate:  [66-89] 82 (10/02 1607) Cardiac Rhythm: Normal sinus rhythm (10/01 1906) Resp:  [11-29] 20 (10/02 1607) BP: (123-264)/(67-182) 194/171 (10/02 1607) SpO2:  [97 %-100 %] 100 % (10/02 1607)  Recent Labs  Lab 08/25/24 1631  GLUCAP 194*   Recent Labs  Lab 08/25/24 1628 08/25/24 1640 08/26/24 0022  NA 139 141  --   K 3.8 3.8  --   CL 106 106  --   CO2 23  --   --   GLUCOSE 114* 115*  --   BUN 26* 28*  --   CREATININE 1.64* 1.80* 1.37*  CALCIUM  9.0  --   --    Recent Labs  Lab 08/25/24 1628  AST 18  ALT 20  ALKPHOS 57  BILITOT 0.3  PROT 6.9  ALBUMIN 3.3*   Recent Labs  Lab 08/25/24 1628 08/25/24 1640 08/26/24 0022  WBC 5.5  --  6.9  NEUTROABS 2.7  --   --   HGB 13.6 14.3 12.9  HCT 42.3 42.0 40.2  MCV 89.8  --  88.9  PLT 221  --  217   No results for input(s): CKTOTAL, CKMB, CKMBINDEX, TROPONINI in the last 168 hours. Recent Labs    08/25/24 1628  LABPROT 14.5  INR 1.1   Recent Labs    08/25/24 1740  COLORURINE YELLOW  LABSPEC 1.017  PHURINE 5.0  GLUCOSEU NEGATIVE  HGBUR SMALL*  BILIRUBINUR NEGATIVE  KETONESUR NEGATIVE  PROTEINUR 30*  NITRITE NEGATIVE  LEUKOCYTESUR NEGATIVE       Component Value Date/Time   CHOL 171 08/26/2024 1319   TRIG 61 08/26/2024 1319   HDL 41 08/26/2024 1319   CHOLHDL 4.2 08/26/2024 1319   VLDL 12 08/26/2024 1319   LDLCALC 118 (H) 08/26/2024 1319   Lab Results  Component Value Date   HGBA1C 5.1 08/26/2024       Component Value Date/Time   LABOPIA NONE DETECTED 08/25/2024 1740   COCAINSCRNUR POSITIVE (A) 08/25/2024 1740   LABBENZ NONE DETECTED 08/25/2024 1740   AMPHETMU NONE DETECTED 08/25/2024 1740   THCU NONE DETECTED 08/25/2024 1740   LABBARB NONE DETECTED 08/25/2024 1740    Recent Labs  Lab 08/25/24 1631  ETH <15    I have personally reviewed the radiological images below and agree with the radiology interpretations.  CT ANGIO HEAD NECK W WO CM Result Date: 08/26/2024 EXAM: CTA HEAD AND NECK WITHOUT AND WITH 08/26/2024 03:16:09 PM TECHNIQUE: CTA of the head and neck was performed without and with the administration of 65 mL of intravenous iohexol  (OMNIPAQUE ) 350 MG/ML injection. Multiplanar 2D and/or 3D reformatted images are provided for review. Automated exposure control, iterative reconstruction, and/or weight based adjustment of the mA/kV was utilized to reduce the radiation dose to as low as reasonably achievable. Stenosis of the internal carotid arteries measured using NASCET criteria. COMPARISON: MRI head and CT head 08/25/2024 and MRI head and neck 11/15/2020. CLINICAL HISTORY: Stroke/TIA, determine embolic source. FINDINGS: CTA NECK: AORTIC ARCH AND ARCH VESSELS: Mild  atherosclerosis of the visualized aortic arch. No dissection or arterial injury. No significant stenosis of the brachiocephalic or subclavian arteries. CERVICAL CAROTID ARTERIES: Right carotid artery: Patent from the origin to the skull base. Tortuosity of the proximal right carotid artery. Mixed atherosclerotic plaque at the right carotid bifurcation extending into the right carotid bulb with a prominent noncalcified component. There is no hemodynamically significant stenosis. Left carotid artery: Patent from the origin to the skull base. Mixed atherosclerosis at the carotid bifurcation extending into the carotid bulb. There is approximately 55% stenosis of the proximal left cervical ICA. Mild tortuosity of the mid left  cervical ICA. CERVICAL VERTEBRAL ARTERIES: Right vertebral artery: Limited evaluation of the origin due to artifact. There is at least moderate to severe stenosis at the right vertebral artery origin. The right vertebral artery is patent from the proximal V1 segment to the vertebrobasilar confluence. Left vertebral artery: Occluded at its origin, similar to 2021. There is reconstitution of the V2 segment of the left vertebral artery around the C5-C6 level. The left vertebral artery is patent from the proximal V2 segment to the vertebrobasilar confluence. LUNGS AND MEDIASTINUM: Emphysema. SOFT TISSUES: Similar prominence of the adenoids; consider nonemergent correlation with direct visualization. Thyroid  nodules, the largest in the left thyroid  lobe measuring up to 1.4 cm. BONES: Degenerative changes in the visualized spine. Anterior cervical fusion hardware at C4-C5 and C6-C7. There is additional solid fusion of the C3 and C4 vertebral bodies. The mandible is edentulous. CTA HEAD: ANTERIOR CIRCULATION: Intracranial internal carotid arteries are patent bilaterally. Atherosclerosis of the carotid siphons. There is mild to moderate stenosis of the right cavernous ICA and mild stenosis of the left cavernous ICA. The middle cerebral arteries are patent bilaterally. The anterior cerebral arteries are patent bilaterally. No aneurysm. POSTERIOR CIRCULATION: The basilar artery is patent proximally with significant distal tapering. There is occlusion of the distal basilar artery. Reconstitution of the basilar artery at the level of the superior cerebellar artery origins. The posterior inferior cerebellar arteries are patent bilaterally. The superior cerebellar arteries are patent bilaterally. P1 segments noted bilaterally. The posterior cerebral arteries are patent bilaterally and are primarily supplied via the posterior communicating arteries. No aneurysm. 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 Nonspecific  hypoattenuation in the periventricular and subcortical white matter, most likely representing chronic microvascular ischemic changes. Mucosal thickening in the right sphenoid sinus. No dural venous sinus thrombosis on this non-dedicated study. IMPRESSION: 1. 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 Reconstitution at the level of the SCA origins likely via the posterior communicating arteries. The PCAs are patent bilaterally and primarily supplied via the posterior communicating arteries. 2. 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 Reconstitution of the V2 segment around the C5-C6 level. 3. Mixed atherosclerosis at the left carotid bifurcation extending into the carotid bulb with approximately 55% stenosis of the proximal left cervical ICA. 4. Mixed atherosclerotic plaque at the right carotid bifurcation with a prominent noncalcified component. No hemodynamically significant stenosis. 5. Limited evaluation of the right vertebral artery origin due to artifact. There is at least moderate to severe stenosis of the proximal right V1 segment. 6. 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 7. Similar prominence of the adenoids, greater than expected for age. Consider nonemergent correlation with direct visualization. 8. Emphysema. Electronically signed by: Donnice Mania MD 08/26/2024 04:16 PM EDT RP Workstation: [id number]   ECHOCARDIOGRAM COMPLETE Result Date: 08/26/2024  ECHOCARDIOGRAM REPORT   Patient Name:   MAICEY BARRIENTEZ Date of Exam: 08/26/2024 Medical Rec #:  [id number]       Height:       64.0 in Accession #:    [id number]      Weight:       210.0 lb Date of Birth:  September 08, 1961       BSA:          1.997 m Patient Age:    63 years        BP:           187/109 mmHg Patient Gender: F               HR:           66 bpm. Exam Location:  Inpatient Procedure: 2D Echo (Both Spectral and Color Flow Doppler were utilized during             procedure). Indications:    Stroke  History:        Patient has prior history of Echocardiogram examinations. Risk                 Factors:Hypertension.  Sonographer:    Charmaine Gaskins Referring Phys: [id number] MOHAMMAD L GARBA  Sonographer Comments: Unable to valsalva for LVOT obstruction. IMPRESSIONS  1. Left ventricular ejection fraction, by estimation, is >75%. Left ventricular ejection fraction by 2D MOD biplane is 78.9 %. The left ventricle has hyperdynamic function. The left ventricle has no regional wall motion abnormalities. There is severe left ventricular hypertrophy. Left ventricular diastolic parameters are consistent with Grade I diastolic dysfunction (impaired relaxation).  2. Right ventricular systolic function is normal. The right ventricular size is normal. Tricuspid regurgitation signal is inadequate for assessing PA pressure.  3. The mitral valve is grossly normal. Trivial mitral valve regurgitation.  4. The aortic valve is tricuspid. Aortic valve regurgitation is not visualized.  5. The inferior vena cava is normal in size with greater than 50% respiratory variability, suggesting right atrial pressure of 3 mmHg. Comparison(s): Changes from prior study are noted. 11/20/2020: LVEF 70-75%. FINDINGS  Left Ventricle: Left ventricular ejection fraction, by estimation, is >75%. Left ventricular ejection fraction by 2D MOD biplane is 78.9 %. The left ventricle has hyperdynamic function. The left ventricle has no regional wall motion abnormalities. The left ventricular internal cavity size was normal in size. There is severe left ventricular hypertrophy. Left ventricular diastolic parameters are consistent with Grade I diastolic dysfunction (impaired relaxation). Indeterminate filling pressures. Right Ventricle: The right ventricular size is normal. No increase in right ventricular wall thickness. Right ventricular systolic function is normal. Tricuspid regurgitation signal is inadequate for assessing PA  pressure. Left Atrium: Left atrial size was normal in size. Right Atrium: Right atrial size was normal in size. Pericardium: There is no evidence of pericardial effusion. Mitral Valve: The mitral valve is grossly normal. Trivial mitral valve regurgitation. Tricuspid Valve: The tricuspid valve is not well visualized. Tricuspid valve regurgitation is not demonstrated. Aortic Valve: The aortic valve is tricuspid. Aortic valve regurgitation is not visualized. Pulmonic Valve: The pulmonic valve was normal in structure. Pulmonic valve regurgitation is not visualized. Aorta: The aortic root and ascending aorta are structurally normal, with no evidence of dilitation. Venous: The inferior vena cava is normal in size with greater than 50% respiratory variability, suggesting right atrial pressure of 3 mmHg. IAS/Shunts: No atrial level shunt detected by color flow Doppler.  LEFT VENTRICLE PLAX 2D  Biplane EF (MOD) LVIDd:         4.15 cm         LV Biplane EF:   Left LVIDs:         2.19 cm                          ventricular LV PW:         1.58 cm                          ejection LV IVS:        1.58 cm                          fraction by LVOT diam:     2.19 cm                          2D MOD LVOT Area:     3.77 cm                         biplane is                                                 78.9 %.  LV Volumes (MOD)               Diastology LV vol d, MOD    39.9 ml       LV e' medial:    4.79 cm/s A2C:                           LV E/e' medial:  12.8 LV vol d, MOD    46.7 ml       LV e' lateral:   4.46 cm/s A4C:                           LV E/e' lateral: 13.7 LV vol s, MOD    7.8 ml A2C: LV vol s, MOD    10.4 ml A4C: LV SV MOD A2C:   32.1 ml LV SV MOD A4C:   46.7 ml LV SV MOD BP:    34.7 ml RIGHT VENTRICLE RV Basal diam:  2.08 cm RV Mid diam:    2.28 cm RV S prime:     18.20 cm/s LEFT ATRIUM             Index        RIGHT ATRIUM           Index LA diam:        4.53 cm 2.27 cm/m   RA Area:     13.10  cm LA Vol (A2C):   63.2 ml 31.64 ml/m  RA Volume:   27.40 ml  13.72 ml/m LA Vol (A4C):   49.8 ml 24.93 ml/m LA Biplane Vol: 60.5 ml 30.29 ml/m   AORTA Ao Asc diam: 3.53 cm MITRAL VALVE MV Area (PHT): 2.76 cm    SHUNTS MV Decel Time: 275 msec    Systemic Diam: 2.19 cm MV E velocity: 61.30 cm/s MV A velocity: 96.90 cm/s MV E/A ratio:  0.63 Vinie Maxcy MD  Electronically signed by Vinie Maxcy MD Signature Date/Time: 08/26/2024/11:22:51 AM    Final    MR BRAIN WO CONTRAST Result Date: 08/25/2024 EXAM: MRI BRAIN WITHOUT CONTRAST 08/25/2024 06:36:45 PM TECHNIQUE: Multiplanar multisequence MRI of the head/brain was performed without the administration of intravenous contrast. COMPARISON: Earlier same day CT head. CLINICAL HISTORY: Neuro deficit, acute, stroke suspected. FINDINGS: BRAIN AND VENTRICLES: Restricted diffusion in the left basal ganglia involving the posterior body and tail of the caudate nucleus extending into the posterior limb of the internal capsule compatible with acute infarcts. There is encephalomalacia in the left cerebellum compatible with remote infarct. Scattered T2 FLAIR hyperintensity in the periventricular and subcortical white matter with additional signal abnormality in the thalami and pons suggestive of moderate chronic microvascular ischemic changes. Chronic microhemorrhages in the right thalamus. Remote lacunar infarcts in the bilateral basal ganglia. No acute intracranial hemorrhage. No mass. No midline shift. No hydrocephalus. The sella is unremarkable. Normal flow voids. ORBITS: No acute abnormality. SINUSES AND MASTOIDS: Mucosal thickening in the right sphenoid sinus. BONES AND SOFT TISSUES: Normal marrow signal. No acute soft tissue abnormality. IMPRESSION: 1. Acute infarcts in the left basal ganglia as above. 2. 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 3. Remote lacunar infarcts in the bilateral basal ganglia. 4. Moderate chronic microvascular ischemic changes. Electronically  signed by: Donnice Mania MD 08/25/2024 07:35 PM EDT RP Workstation: [id number]   CT HEAD WO CONTRAST Result Date: 08/25/2024 CLINICAL DATA:  Right-sided weakness facial droop EXAM: CT HEAD WITHOUT CONTRAST TECHNIQUE: Contiguous axial images were obtained from the base of the skull through the vertex without intravenous contrast. RADIATION DOSE REDUCTION: This exam was performed according to the departmental dose-optimization program which includes automated exposure control, adjustment of the mA and/or kV according to patient size and/or use of iterative reconstruction technique. COMPARISON:  CT brain 01/20/2024 MRI 11/15/2020 FINDINGS: Brain: No acute territorial infarction, hemorrhage or intracranial mass. Chronic left cerebellar infarct. Widespread bilateral white matter hypodensity most likely chronic small vessel ischemic change. The ventricles are nonenlarged Vascular: No hyperdense vessels.  Carotid vascular calcification Skull: Normal. Negative for fracture or focal lesion. Sinuses/Orbits: No acute finding. Other: None IMPRESSION: 1. No CT evidence for acute intracranial abnormality. 2. Chronic left cerebellar infarct. Widespread bilateral white matter hypodensity most likely chronic small vessel ischemic change. Electronically Signed   By: Luke Bun M.D.   On: 08/25/2024 17:13     PHYSICAL EXAM  Temp:  [97.5 F (36.4 C)-98.9 F (37.2 C)] 97.8 F (36.6 C) (10/02 1607) Pulse Rate:  [66-89] 82 (10/02 1607) Resp:  [11-29] 20 (10/02 1607) BP: (123-264)/(67-182) 194/171 (10/02 1607) SpO2:  [97 %-100 %] 100 % (10/02 1607)  General - Well nourished, well developed, in no apparent distress.  Ophthalmologic - fundi not visualized due to noncooperation.  Cardiovascular - Regular rhythm and rate.  Neuro - awake, alert, eyes open, orientated to age, place, time and people. No aphasia but with moderate to severe dysarthria, questions appropriately however paucity of speech, following all simple  commands. Able to name and repeat. No gaze palsy, tracking bilaterally, visual field full.  Right facial droop. Tongue midline.  Left upper and lower extremity 5/5, right upper extremity 0/5, right lower extremity 3/5 proximal and 0/5 distally. Sensation symmetrical bilaterally, left FTN intact, gait not tested.    ASSESSMENT/PLAN Ms. ZARAY GATCHEL is a 63 y.o. female with history of hypertension, stroke, migraine, smoker, cocaine user, bilateral CTS admitted for slurred speech, right-sided weakness, right facial droop and hypertension.  No TNK given due to outside window.    Stroke:  left BG/CR infarct likely secondary to small vessel disease source CT left old cerebellar infarct CT head and neck mid/distal basilar artery short segment occlusion/near occlusion, reconstituted at the level of SCAs.  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  Bilateral ICA mixed atherosclerosis with 50% stenosis on the left ICA. MRI left BG/CR infarct, old bilateral BG infarct and left cerebellar infarct 2D Echo EF more than 75% LDL 118 HgbA1c 5.1 UDS positive for cocaine Lovenox  for VTE prophylaxis clopidogrel  75 mg daily (noncompliant) prior to admission, now on aspirin  81 mg daily and clopidogrel  75 mg daily DAPT for 3 weeks and then Plavix  alone. Patient counseled to be compliant with her antithrombotic medications Ongoing aggressive stroke risk factor management Therapy recommendations:  SNF Disposition: Pending  History of stroke 10/2020 admitted for large left cerebellar infarct with petechial hemorrhage and edema, small right cerebellar infarct.  MRA head and neck showed left VA occlusion.  Repeat CT stable mass effect.  EF 60 to 65%.  LDL 119, A1c 5.5, UDS positive for cocaine.  Discharged on DAPT 3 months and Lipitor 40 12/2020 admitted for GI bleeding with hemoglobin 6.5.  Status post EGD showed nonbleeding peptic ulcer.  Continued Plavix  alone, aspirin  discontinued.  Intracranial  and extradural stenosis CT head and neck mid/distal basilar artery short segment occlusion/near occlusion, reconstituted at the level of SCAs.  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  Bilateral ICA mixed atherosclerosis with 50% stenosis on the left ICA. Likely due to uncontrolled risk factors including smoking cigarette, cocaine use and noncompliant with medication Now on DAPT  Hypertensive urgency Very high BP on presentation Currently BP still high Permissive hypertension for the next 2 days given severe vascular stenosis, and then gradually normalize BP in 3 to 5 days Long term BP goal 130-150 given severe bilateral artery stenosis  Hyperlipidemia Home meds: Lipitor 40, noncompliant LDL 118, goal < 70 Now on Lipitor 80 Continue statin at discharge  Tobacco abuse Current smoker Smoking cessation counseling provided Pt is willing to quit  Cocaine abuse UDS positive for cocaine Cocaine cessation will be provided  Dysphagia Passed swallow earlier today Then became sleepy and currently n.p.o. Need bedside swallow screening again on the floor Advance diet once able.  Other Stroke Risk Factors Medication noncompliance Migraines  Other Active Problems Bilateral CTS  Hospital day # 1  I discussed with Dr. Perri. I spent extensive total face-to-face time with the patient and family, reviewing test results, images and medication, and discussing the diagnosis, treatment plan and potential prognosis. This patient's care requiresreview of multiple databases, neurological assessment, discussion with family, other specialists and medical decision making of high complexity.   Ary Cummins, MD PhD Stroke Neurology 08/26/2024 4:44 PM    To contact Stroke Continuity provider, please refer to [email or web address]. After hours, contact General Neurology

## 2024-08-26 NOTE — ED Notes (Addendum)
 Speech therapy present in room with patient

## 2024-08-26 NOTE — Evaluation (Signed)
 Clinical/Bedside Swallow Evaluation Patient Details  Name: Deborah Mcmahon MRN: [id number] Date of Birth: May 14, 1961  Today's Date: 08/26/2024 Time: SLP Start Time (ACUTE ONLY): 1335 SLP Stop Time (ACUTE ONLY): 1343 SLP Time Calculation (min) (ACUTE ONLY): 8 min  Past Medical History:  Past Medical History:  Diagnosis Date   Carpal tunnel syndrome    bilateral   Chronic back pain    spondylolisthesis   Constipation    takes an OTC stool softener daily as needed   Gallstones    History of migraine    Hypertension    Nausea and vomiting 01/20/2024   Nocturia    Weakness    tingling    Past Surgical History:  Past Surgical History:  Procedure Laterality Date   BACK SURGERY     fusion   BIOPSY  12/31/2020   Procedure: BIOPSY;  Surgeon: Wilhelmenia Aloha Raddle., MD;  Location: [facility] ENDOSCOPY;  Service: Gastroenterology;;   CERVICAL SPINE SURGERY     x 4-fusion   COLONOSCOPY WITH PROPOFOL  N/A 12/31/2020   Procedure: COLONOSCOPY WITH PROPOFOL ;  Surgeon: Wilhelmenia Aloha Raddle., MD;  Location: [facility] ENDOSCOPY;  Service: Gastroenterology;  Laterality: N/A;   ESOPHAGOGASTRODUODENOSCOPY (EGD) WITH PROPOFOL  N/A 12/31/2020   Procedure: ESOPHAGOGASTRODUODENOSCOPY (EGD) WITH PROPOFOL ;  Surgeon: Wilhelmenia Aloha Raddle., MD;  Location: [facility] ENDOSCOPY;  Service: Gastroenterology;  Laterality: N/A;   MYRINGOTOMY     POLYPECTOMY  12/31/2020   Procedure: POLYPECTOMY;  Surgeon: Mansouraty, Aloha Raddle., MD;  Location: [facility] ENDOSCOPY;  Service: Gastroenterology;;   right knee surgery     screws   TONSILLECTOMY AND ADENOIDECTOMY     TUBAL LIGATION     HPI:  63 yo female presenting to ED 10/1 with slurred speech, R sided weakness, and R facial droop. MRI shows acute infarcts in the L basal ganglia and remote infarcts in the L cerebellum and bilateral basal ganglia. Pt initially passed the swallow screen but remained NPO due to a change in status overnight, SLP consulted for evaluation 10/2. 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 This affects labial seal, contributing to consistent anterior loss with thin liquids.  Forceful coughing intermittently follows sips of liquids and pt was unable to complete the 3 oz water test without stopping and clearing her throat. Oral containment improved with purees and solids but seemingly required multiple swallows to clear. Will proceed with an MBS for further assessment given presentation and site of lesion. Pending MBS, recommend NPO status be maintained with the exception of meds crushed in puree. SLP will f/u as scheduling allows. SLP Visit Diagnosis: Dysphagia, unspecified (R13.10)    Aspiration Risk  Moderate aspiration risk    Diet Recommendation NPO except meds;Ice chips PRN after oral care    Medication Administration: Crushed with puree    Other  Recommendations Oral Care Recommendations: Oral care QID;Oral care prior to ice chip/H20     Assistance Recommended at Discharge    Functional Status Assessment Patient has had a recent decline in their functional status and demonstrates the ability to make significant improvements in function in a reasonable and predictable amount of time.  Frequency and Duration min 2x/week  2 weeks       Prognosis Prognosis for improved oropharyngeal function: Fair Barriers to Reach Goals: Cognitive deficits      Swallow  Study   General HPI: 63 yo female presenting to ED 10/1 with slurred speech, R sided weakness, and R facial droop. MRI shows acute infarcts in the L basal ganglia and remote infarcts in the L cerebellum and bilateral basal ganglia. Pt initially passed the swallow screen but remained NPO due to a change in status overnight, SLP consulted for evaluation 10/2. 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), HTN, migraines, carpel tunnel, COPD, gout Type of Study: Bedside Swallow Evaluation Previous Swallow Assessment: none in chart Diet Prior to this Study: NPO Temperature Spikes Noted: No Respiratory Status: Room air History of Recent Intubation: No Behavior/Cognition: Alert;Cooperative;Requires cueing Oral Cavity Assessment: Within Functional Limits Oral Care Completed by SLP: No Oral Cavity - Dentition: Adequate natural dentition Vision: Functional for self-feeding Self-Feeding Abilities: Needs assist Patient Positioning: Upright in bed Baseline Vocal Quality: Normal Volitional Cough: Strong Volitional Swallow: Able to elicit    Oral/Motor/Sensory Function Overall Oral Motor/Sensory Function: Moderate impairment Facial ROM: Reduced right;Suspected CN VII (facial) dysfunction Facial Symmetry: Abnormal symmetry right;Suspected CN VII (facial) dysfunction Facial Strength: Reduced right;Suspected CN VII (facial) dysfunction Lingual ROM: Reduced right;Suspected CN XII (hypoglossal) dysfunction Lingual Symmetry: Abnormal symmetry right;Suspected CN XII (hypoglossal) dysfunction Lingual Strength: Reduced;Suspected CN XII (hypoglossal) dysfunction Lingual Sensation: Reduced;Suspected CN VII (facial) dysfunction-anterior 2/3 tongue   Ice Chips Ice chips: Within functional limits Presentation: Spoon   Thin Liquid Thin Liquid: Impaired Presentation: Straw Oral Phase Impairments: Reduced labial seal Oral Phase Functional Implications: Right anterior spillage Pharyngeal  Phase Impairments: Multiple swallows;Cough - Immediate    Nectar Thick Nectar Thick Liquid: Not tested   Honey Thick Honey Thick Liquid: Not tested   Puree Puree: Within functional limits   Solid     Solid: Within functional limits      Damien Blumenthal, M.A., CCC-SLP Speech Language Pathology, Acute Rehabilitation Services  Secure Chat preferred [phone]  08/26/2024,2:00  PM

## 2024-08-26 NOTE — Evaluation (Signed)
 Occupational Therapy Evaluation Patient Details Name: Deborah Mcmahon MRN: [id number] DOB: 19-May-1961 Today's Date: 08/26/2024   History of Present Illness   Pt is a 63 yo female admitted for slurred speech, R side weakness and R facial droop. 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 PMH: substance abuse, cerevellar CVA, HTN, migraines, carpel tunnel, COPD, gout.     Clinical Impressions Pt admitted with the above diagnosis and has the deficits outlined below. Pt would benefit from cont OT to increase independence with basic adls and adl transfers so she can eventually return home to live with her fiance. Pt was previously independent with all adls and mobility as well as driving PTA and fiance works full time. Pt has friends that can check on her but nobody to stay with her 24/7. For this reason feel pt will need less than 3 hours of therapy a day prior to returning home. Pt is very motivated to work and be independent. If someone can be found to stay with pt 24/7 on initial d/c from rehab, feel greater than 3 hours a day of therapy would be ideal. Will continue to see with focus on adls and functional mobility.     If plan is discharge home, recommend the following:   A lot of help with walking and/or transfers;A lot of help with bathing/dressing/bathroom;Assistance with cooking/housework;Direct supervision/assist for medications management;Direct supervision/assist for financial management;Assist for transportation;Help with stairs or ramp for entrance;Supervision due to cognitive status     Functional Status Assessment   Patient has had a recent decline in their functional status and demonstrates the ability to make significant improvements in function in a reasonable and predictable amount of time.     Equipment Recommendations    (tbd)     Recommendations for Other Services         Precautions/Restrictions   Precautions Precautions: Fall Recall of  Precautions/Restrictions: Intact Restrictions Weight Bearing Restrictions Per Provider Order: No     Mobility Bed Mobility Overal bed mobility: Needs Assistance Bed Mobility: Supine to Sit, Sit to Supine     Supine to sit: Mod assist Sit to supine: Mod assist   General bed mobility comments: Pt used rails and had good abdominal strength to be able to pull self to EOB    Transfers Overall transfer level: Needs assistance Equipment used: 1 person hand held assist Transfers: Sit to/from Stand Sit to Stand: Min assist           General transfer comment: cues for hand placement. Pt unable to get RLE to floor due to increased tone      Balance Overall balance assessment: Needs assistance Sitting-balance support: Feet supported Sitting balance-Leahy Scale: Fair     Standing balance support: Bilateral upper extremity supported, During functional activity Standing balance-Leahy Scale: Poor                             ADL either performed or assessed with clinical judgement   ADL Overall ADL's : Needs assistance/impaired Eating/Feeding: NPO   Grooming: Oral care;Wash/dry face;Wash/dry hands;Moderate assistance;Applying deodorant;Brushing hair;Sitting Grooming Details (indicate cue type and reason): no movement in R side Upper Body Bathing: Moderate assistance;Sitting   Lower Body Bathing: Maximal assistance;Sit to/from stand;Cueing for compensatory techniques   Upper Body Dressing : Maximal assistance;Sitting   Lower Body Dressing: Moderate assistance;Sit to/from stand;Cueing for compensatory techniques   Toilet Transfer: Moderate assistance;Stand-pivot;BSC/3in1 (to strong side) Statistician Details (indicate  cue type and reason): to strong side Toileting- Clothing Manipulation and Hygiene: Maximal assistance;Sit to/from stand       Functional mobility during ADLs: Maximal assistance General ADL Comments: Pt very limited with all adls due to no  movement in RUE. Pt moves RLE some but pt with a great amount of tone in both RUE and RLE     Vision Baseline Vision/History: 0 No visual deficits Ability to See in Adequate Light: 0 Adequate Patient Visual Report: No change from baseline Vision Assessment?: Yes Eye Alignment: Within Functional Limits Ocular Range of Motion: Within Functional Limits Alignment/Gaze Preference: Within Defined Limits Tracking/Visual Pursuits: Able to track stimulus in all quads without difficulty Convergence: Within functional limits Visual Fields: No apparent deficits     Perception Perception: Impaired Preception Impairment Details: Inattention/Neglect Perception-Other Comments: right side although immediately started tending to R side when instructed to tend to it.   Praxis         Pertinent Vitals/Pain Pain Assessment Pain Assessment: No/denies pain     Extremity/Trunk Assessment Upper Extremity Assessment Upper Extremity Assessment: RUE deficits/detail RUE Deficits / Details: PROM WFL with a lot of tone. No active functional movement noted. RUE Sensation: WNL RUE Coordination: decreased fine motor;decreased gross motor   Lower Extremity Assessment Lower Extremity Assessment: Defer to PT evaluation   Cervical / Trunk Assessment Cervical / Trunk Assessment: Normal   Communication Communication Communication: Impaired Factors Affecting Communication: Reduced clarity of speech   Cognition Arousal: Alert Behavior During Therapy: Anxious Cognition: Cognition impaired         Attention impairment (select first level of impairment): Selective attention   OT - Cognition Comments: Pt is impulsive and very anxious. Feel pt has awarness of how debilitating this CVA is and is coming to grips with knowing she cannot live alone.                 Following commands: Intact       Cueing  General Comments   Cueing Techniques: Verbal cues  Pt very limited with all adls due to very  little movement in R side and increased tone in R side.   Exercises     Shoulder Instructions      Home Living   Living Arrangements: Spouse/significant other Available Help at Discharge: Friend(s);Available PRN/intermittently Type of Home: House Home Access: Stairs to enter [employer] of Steps: 2 Entrance Stairs-Rails: Right;Left;Can reach both Home Layout: One level     Bathroom Shower/Tub: Tub/shower unit;Curtain;Door   [employer] Toilet: Standard     Home Equipment: [place] - single point;Shower seat   Additional Comments: Pt lives with fiance who works full time      Prior Functioning/Environment Prior Level of Function : Independent/Modified Independent;Driving             Mobility Comments: has a cane but never uses it ADLs Comments: fully independent and driving    OT Problem List: Decreased strength;Decreased range of motion;Impaired balance (sitting and/or standing);Decreased coordination;Decreased cognition;Decreased safety awareness;Decreased knowledge of use of DME or AE;Decreased knowledge of precautions;Cardiopulmonary status limiting activity;Impaired UE functional use;Impaired tone   OT Treatment/Interventions: Self-care/ADL training;DME and/or AE instruction;Neuromuscular education;Therapeutic activities;Balance training      OT Goals(Current goals can be found in the care plan section)   Acute Rehab OT Goals Patient Stated Goal: to move my r side OT Goal Formulation: With patient Time For Goal Achievement: 09/09/24 Potential to Achieve Goals: Good ADL Goals Pt Will Perform Eating: with set-up;sitting Pt Will  Perform Grooming: with min assist;sitting Pt Will Perform Lower Body Bathing: with min assist;sit to/from stand Pt Will Perform Lower Body Dressing: with min assist;sit to/from stand Pt Will Transfer to Toilet: with contact guard assist;stand pivot transfer;bedside commode Pt Will Perform Toileting - Clothing Manipulation and  hygiene: with contact guard assist;sit to/from stand Additional ADL Goal #1: Pt will tend to RUE due to inattention to maintain integrity of that limb with min cues.   OT Frequency:  Min 2X/week    Co-evaluation              AM-PAC OT 6 Clicks Daily Activity     Outcome Measure Help from another person eating meals?: A Lot Help from another person taking care of personal grooming?: A Lot Help from another person toileting, which includes using toliet, bedpan, or urinal?: A Lot Help from another person bathing (including washing, rinsing, drying)?: A Lot Help from another person to put on and taking off regular upper body clothing?: A Lot Help from another person to put on and taking off regular lower body clothing?: A Lot 6 Click Score: 12   End of Session Nurse Communication: Mobility status;Other (comment) (BP 164/112)  Activity Tolerance: Patient tolerated treatment well Patient left: in bed;with call bell/phone within reach  OT Visit Diagnosis: Unsteadiness on feet (R26.81);Other abnormalities of gait and mobility (R26.89);Muscle weakness (generalized) (M62.81);Feeding difficulties (R63.3);Other symptoms and signs involving the nervous system (R29.898);Other symptoms and signs involving cognitive function;Hemiplegia and hemiparesis Hemiplegia - Right/Left: Right Hemiplegia - dominant/non-dominant: Dominant Hemiplegia - caused by: Cerebral infarction                Time: 8784-8754 OT Time Calculation (min): 30 min Charges:  OT General Charges $OT Visit: 1 Visit OT Evaluation $OT Eval Moderate Complexity: 1 Mod OT Treatments $Self Care/Home Management : 8-22 mins  Joshua Silvano Dragon 08/26/2024, 1:03 PM

## 2024-08-26 NOTE — ED Notes (Signed)
 Patient transported to CT

## 2024-08-26 NOTE — Progress Notes (Signed)
 PROGRESS NOTE    Deborah Mcmahon  FMW:[id number] DOB: 23-May-1961 DOA: 08/25/2024 PCP: Leontine Cramp, NP  Chief Complaint  Patient presents with   Stroke Symptoms    Brief Narrative:   Deborah Mcmahon is Deborah Mcmahon 63 y.o. female with medical history significant of essential hypertension, hyperlipidemia, previous CVA involving the cerebellar region, migraine headaches, carpal tunnel syndrome, who presented to the ER with slurred speech, right-sided weakness and facial droop.   Assessment & Plan:   Principal Problem:   Acute CVA (cerebrovascular accident) [facility]) Active Problems:   Essential hypertension   Tobacco abuse   CKD (chronic kidney disease), stage III (HCC)   Substance use  Acute Stroke MRI with acute infarcts in L basal ganglia (chronic in L cerebellum, bilateral basal ganglia, chronic microvascular ischemic changes) CTA head/neck pending Echo pending  A1c 5.1, LDL pending PT/OT/SLP  Appreciate neurology c/s  DAPT, statin  Acute Metabolic Encephalopathy Maybe just sleepy with acute hospitalization, illness, but seems more notable - ? Delirium  VBG, ammonia, b12, folate, TSH UA not c/w UTI  Delirium precautions  Hypertension  Continuing hydralazine - coreg  and amlodipine  currently on hold.  Lasix  on hold.  Losartan  on hold. BP's improved Neurology recommended BP management per standard protocol  CKD IIIb Baseline creatinine appears between 1.2-1.6 Around baseline, trend   Thrush Clotrimazole troche  COPD  Breo ellipta   GERD  PPI  Gout Continue allopurinol  for now  Muscle Spasms Baclofen  Unclear if taking - need to clarify this  Holding muscle relaxer for now (robaxin)   Not clear if med rec correct - has metoprolol and coreg  listed.  Will discuss with pharmacy to see if we can get this updated.      DVT prophylaxis: lovenox  Code Status: full Family Communication: none Disposition:   Status is: Inpatient Remains inpatient appropriate because: need  for continued inpatient care   Consultants:  neurology  Procedures:  Echo pending  Antimicrobials:  Anti-infectives (From admission, onward)    None       Subjective: Sleepy, didn't express any complaints, but difficult to keep her awake  Objective: Vitals:   08/26/24 0800 08/26/24 0830 08/26/24 0912 08/26/24 0930  BP: 123/67 (!) 141/76 (!) 160/81 (!) 145/85  Pulse:  66    Resp:  19    Temp:      TempSrc:      SpO2:  99%     No intake or output data in the 24 hours ending 08/26/24 1038 There were no vitals filed for this visit.  Examination:  General exam: Appears calm and comfortable  Respiratory system: unlabored Cardiovascular system: RRR Gastrointestinal system: Abdomen is nondistended, soft and nontender.  Central nervous system: lethargic, difficult to keep her awake - dysarthric R sided weakness, mild tongue deviation to R - difficult exam due to lethargy Extremities: no LEE    Data Reviewed: I have personally reviewed following labs and imaging studies  CBC: Recent Labs  Lab 08/25/24 1628 08/25/24 1640 08/26/24 0022  WBC 5.5  --  6.9  NEUTROABS 2.7  --   --   HGB 13.6 14.3 12.9  HCT 42.3 42.0 40.2  MCV 89.8  --  88.9  PLT 221  --  217    Basic Metabolic Panel: Recent Labs  Lab 08/25/24 1628 08/25/24 1640 08/26/24 0022  NA 139 141  --   K 3.8 3.8  --   CL 106 106  --   CO2 23  --   --  GLUCOSE 114* 115*  --   BUN 26* 28*  --   CREATININE 1.64* 1.80* 1.37*  CALCIUM  9.0  --   --     GFR: CrCl cannot be calculated (Unknown ideal weight.).  Liver Function Tests: Recent Labs  Lab 08/25/24 1628  AST 18  ALT 20  ALKPHOS 57  BILITOT 0.3  PROT 6.9  ALBUMIN 3.3*    CBG: Recent Labs  Lab 08/25/24 1631  GLUCAP 194*     No results found for this or any previous visit (from the past 240 hours).       Radiology Studies: MR BRAIN WO CONTRAST Result Date: 08/25/2024 EXAM: MRI BRAIN WITHOUT CONTRAST 08/25/2024 06:36:45 PM  TECHNIQUE: Multiplanar multisequence MRI of the head/brain was performed without the administration of intravenous contrast. COMPARISON: Earlier same day CT head. CLINICAL HISTORY: Neuro deficit, acute, stroke suspected. FINDINGS: BRAIN AND VENTRICLES: Restricted diffusion in the left basal ganglia involving the posterior body and tail of the caudate nucleus extending into the posterior limb of the internal capsule compatible with acute infarcts. There is encephalomalacia in the left cerebellum compatible with remote infarct. Scattered T2 FLAIR hyperintensity in the periventricular and subcortical white matter with additional signal abnormality in the thalami and pons suggestive of moderate chronic microvascular ischemic changes. Chronic microhemorrhages in the right thalamus. Remote lacunar infarcts in the bilateral basal ganglia. No acute intracranial hemorrhage. No mass. No midline shift. No hydrocephalus. The sella is unremarkable. Normal flow voids. ORBITS: No acute abnormality. SINUSES AND MASTOIDS: Mucosal thickening in the right sphenoid sinus. BONES AND SOFT TISSUES: Normal marrow signal. No acute soft tissue abnormality. IMPRESSION: 1. Acute infarcts in the left basal ganglia as above. 2. 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 3. Remote lacunar infarcts in the bilateral basal ganglia. 4. Moderate chronic microvascular ischemic changes. Electronically signed by: Donnice Mania MD 08/25/2024 07:35 PM EDT RP Workstation: [id number]   CT HEAD WO CONTRAST Result Date: 08/25/2024 CLINICAL DATA:  Right-sided weakness facial droop EXAM: CT HEAD WITHOUT CONTRAST TECHNIQUE: Contiguous axial images were obtained from the base of the skull through the vertex without intravenous contrast. RADIATION DOSE REDUCTION: This exam was performed according to the departmental dose-optimization program which includes automated exposure control, adjustment of the mA and/or kV according to patient size and/or use of iterative  reconstruction technique. COMPARISON:  CT brain 01/20/2024 MRI 11/15/2020 FINDINGS: Brain: No acute territorial infarction, hemorrhage or intracranial mass. Chronic left cerebellar infarct. Widespread bilateral white matter hypodensity most likely chronic small vessel ischemic change. The ventricles are nonenlarged Vascular: No hyperdense vessels.  Carotid vascular calcification Skull: Normal. Negative for fracture or focal lesion. Sinuses/Orbits: No acute finding. Other: None IMPRESSION: 1. No CT evidence for acute intracranial abnormality. 2. Chronic left cerebellar infarct. Widespread bilateral white matter hypodensity most likely chronic small vessel ischemic change. Electronically Signed   By: Luke Bun M.D.   On: 08/25/2024 17:13        Scheduled Meds:  aspirin  EC  81 mg Oral Daily   atorvastatin   40 mg Oral Daily   clopidogrel   75 mg Oral Daily   clotrimazole  10 mg Oral 5 X Daily   enoxaparin  (LOVENOX ) injection  40 mg Subcutaneous Daily   hydrALAZINE  100 mg Oral BID   Continuous Infusions:  sodium chloride      sodium chloride  40 mL/hr at 08/26/24 0334     LOS: 1 day    Time spent: over 30 min     Meliton Monte, MD [facility]   To contact the attending provider between 7A-7P or the covering provider during after hours 7P-7A, please log into the web site [email or web address] and access using universal Martin password for that web site. If you do not have the password, please call the hospital operator.  08/26/2024, 10:38 AM

## 2024-08-26 NOTE — ED Notes (Signed)
 IV team consulted for IV for ordered fluids and CT scans.

## 2024-08-26 NOTE — Evaluation (Signed)
 Speech Language Pathology Evaluation Patient Details Name: Deborah Mcmahon MRN: [id number] DOB: [birth year] Today's Date: 08/26/2024 Time: 8656-8648 SLP Time Calculation (min) (ACUTE ONLY): 8 min  Problem List:  Patient Active Problem List   Diagnosis Date Noted   Acute CVA (cerebrovascular accident) (HCC) 08/25/2024   Hypertensive crisis 01/20/2024   Nausea and vomiting 01/20/2024   Allergic rhinitis 04/19/2022   DOE (dyspnea on exertion) 02/21/2022   Acute blood loss anemia 12/28/2020   Melena 12/28/2020   Substance use 11/16/2020   CVA (cerebral vascular accident) (HCC) 11/15/2020   Tobacco abuse 11/15/2020   CKD (chronic kidney disease), stage III (HCC) 11/15/2020   Essential hypertension 12/10/2018   Past Medical History:  Past Medical History:  Diagnosis Date   Carpal tunnel syndrome    bilateral   Chronic back pain    spondylolisthesis   Constipation    takes an OTC stool softener daily as needed   Gallstones    History of migraine    Hypertension    Nausea and vomiting 01/20/2024   Nocturia    Weakness    tingling    Past Surgical History:  Past Surgical History:  Procedure Laterality Date   BACK SURGERY     fusion   BIOPSY  12/31/2020   Procedure: BIOPSY;  Surgeon: Wilhelmenia Aloha Raddle., MD;  Location: [facility] ENDOSCOPY;  Service: Gastroenterology;;   CERVICAL SPINE SURGERY     x 4-fusion   COLONOSCOPY WITH PROPOFOL  N/A 12/31/2020   Procedure: COLONOSCOPY WITH PROPOFOL ;  Surgeon: Wilhelmenia Aloha Raddle., MD;  Location: [facility] ENDOSCOPY;  Service: Gastroenterology;  Laterality: N/A;   ESOPHAGOGASTRODUODENOSCOPY (EGD) WITH PROPOFOL  N/A 12/31/2020   Procedure: ESOPHAGOGASTRODUODENOSCOPY (EGD) WITH PROPOFOL ;  Surgeon: Wilhelmenia Aloha Raddle., MD;  Location: [facility] ENDOSCOPY;  Service: Gastroenterology;  Laterality: N/A;   MYRINGOTOMY     POLYPECTOMY  12/31/2020   Procedure: POLYPECTOMY;  Surgeon: Mansouraty, Aloha Raddle., MD;  Location: [facility] ENDOSCOPY;  Service: Gastroenterology;;    right knee surgery     screws   TONSILLECTOMY AND ADENOIDECTOMY     TUBAL LIGATION     HPI:  63 yo female presenting to ED 10/1 with slurred speech, R sided weakness, and R facial droop. MRI shows acute infarcts in the L basal ganglia and remote infarcts in the L cerebellum and bilateral basal ganglia. Pt initially passed the swallow screen but remained NPO due to a change in status overnight, SLP consulted for evaluation 10/2. 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 She is oriented x3 and followed one step commands independently. She is impulsive, demonstrating difficulty with sustained attention. Awareness appears to fluctuate throughout the session, she is intermittently able to compensate for acute RUE weakness by repositioning her arm and using her non-dominant L hand to feed herself. Per chart review, pt was independent PTA and may benefit from intensive SLP f/u to target acute deficits, >3 hrs/day. Will continue following.    SLP Assessment  SLP Recommendation/Assessment: Patient needs continued Speech Language Pathology Services SLP Visit Diagnosis: Dysarthria and anarthria (R47.1);Cognitive communication deficit (R41.841)     Assistance Recommended at Discharge  Frequent or constant Supervision/Assistance  Functional Status Assessment Patient has had a recent decline in their functional status and demonstrates the ability to make significant improvements in function in a reasonable and predictable amount of time.  Frequency  and Duration min 2x/week  2 weeks      SLP Evaluation Cognition  Overall Cognitive Status: Impaired/Different from baseline Arousal/Alertness: Awake/alert Orientation Level: Oriented to person;Oriented to place;Oriented to time;Disoriented to  situation Attention: Sustained Sustained Attention: Impaired Sustained Attention Impairment: Verbal basic;Functional basic Memory: Appears intact Awareness: Impaired Awareness Impairment: Intellectual impairment Problem Solving: Impaired Problem Solving Impairment: Verbal basic;Functional basic       Comprehension  Auditory Comprehension Overall Auditory Comprehension: Appears within functional limits for tasks assessed    Expression Expression Primary Mode of Expression: Verbal Verbal Expression Overall Verbal Expression: Appears within functional limits for tasks assessed   Oral / Motor  Oral Motor/Sensory Function Overall Oral Motor/Sensory Function: Moderate impairment Facial ROM: Reduced right;Suspected CN VII (facial) dysfunction Facial Symmetry: Abnormal symmetry right;Suspected CN VII (facial) dysfunction Facial Strength: Reduced right;Suspected CN VII (facial) dysfunction Lingual ROM: Reduced right;Suspected CN XII (hypoglossal) dysfunction Lingual Symmetry: Abnormal symmetry right;Suspected CN XII (hypoglossal) dysfunction Lingual Strength: Reduced;Suspected CN XII (hypoglossal) dysfunction Lingual Sensation: Reduced;Suspected CN VII (facial) dysfunction-anterior 2/3 tongue Motor Speech Overall Motor Speech: Impaired Respiration: Within functional limits Phonation: Normal Resonance: Within functional limits Articulation: Impaired Level of Impairment: Sentence Intelligibility: Intelligibility reduced Sentence: 50-74% accurate            Damien Blumenthal, M.A., CCC-SLP Speech Language Pathology, Acute Rehabilitation Services  Secure Chat preferred [phone]  08/26/2024, 2:09 PM

## 2024-08-26 NOTE — ED Notes (Signed)
 Patient to CT.

## 2024-08-26 NOTE — ED Notes (Addendum)
 Pt soiled bedding and brief changed. Pericare completed and purewick placed back on pt. Pt repositioned in bed with call light within reach.

## 2024-08-27 ENCOUNTER — Inpatient Hospital Stay (HOSPITAL_COMMUNITY)

## 2024-08-27 DIAGNOSIS — I639 Cerebral infarction, unspecified: Secondary | ICD-10-CM | POA: Diagnosis not present

## 2024-08-27 LAB — CBC WITH DIFFERENTIAL/PLATELET
Abs Immature Granulocytes: 0.02 K/uL (ref 0.00–0.07)
Basophils Absolute: 0 K/uL (ref 0.0–0.1)
Basophils Relative: 0 %
Eosinophils Absolute: 0 K/uL (ref 0.0–0.5)
Eosinophils Relative: 0 %
HCT: 43.2 % (ref 36.0–46.0)
Hemoglobin: 13.9 g/dL (ref 12.0–15.0)
Immature Granulocytes: 0 %
Lymphocytes Relative: 22 %
Lymphs Abs: 1.4 K/uL (ref 0.7–4.0)
MCH: 28 pg (ref 26.0–34.0)
MCHC: 32.2 g/dL (ref 30.0–36.0)
MCV: 87.1 fL (ref 80.0–100.0)
Monocytes Absolute: 0.6 K/uL (ref 0.1–1.0)
Monocytes Relative: 9 %
Neutro Abs: 4.6 K/uL (ref 1.7–7.7)
Neutrophils Relative %: 69 %
Platelets: 236 K/uL (ref 150–400)
RBC: 4.96 MIL/uL (ref 3.87–5.11)
RDW: 15.7 % — ABNORMAL HIGH (ref 11.5–15.5)
WBC: 6.7 K/uL (ref 4.0–10.5)
nRBC: 0 % (ref 0.0–0.2)

## 2024-08-27 LAB — COMPREHENSIVE METABOLIC PANEL WITH GFR
ALT: 19 U/L (ref 0–44)
AST: 23 U/L (ref 15–41)
Albumin: 3.2 g/dL — ABNORMAL LOW (ref 3.5–5.0)
Alkaline Phosphatase: 49 U/L (ref 38–126)
Anion gap: 12 (ref 5–15)
BUN: 19 mg/dL (ref 8–23)
CO2: 17 mmol/L — ABNORMAL LOW (ref 22–32)
Calcium: 9.1 mg/dL (ref 8.9–10.3)
Chloride: 109 mmol/L (ref 98–111)
Creatinine, Ser: 1.05 mg/dL — ABNORMAL HIGH (ref 0.44–1.00)
GFR, Estimated: 59 mL/min — ABNORMAL LOW (ref >60)
Glucose, Bld: 94 mg/dL (ref 70–99)
Potassium: 4 mmol/L (ref 3.5–5.1)
Sodium: 138 mmol/L (ref 135–145)
Total Bilirubin: 1 mg/dL (ref 0.0–1.2)
Total Protein: 7.1 g/dL (ref 6.5–8.1)

## 2024-08-27 LAB — MAGNESIUM: Magnesium: 2.1 mg/dL (ref 1.7–2.4)

## 2024-08-27 LAB — PHOSPHORUS: Phosphorus: 3.7 mg/dL (ref 2.5–4.6)

## 2024-08-27 MED ORDER — SODIUM CHLORIDE 0.9% FLUSH
10.0000 mL | Freq: Two times a day (BID) | INTRAVENOUS | Status: DC
  Administered 2024-08-27: 20 mL
  Administered 2024-08-27 – 2024-08-30 (×6): 10 mL

## 2024-08-27 MED ORDER — HYDRALAZINE HCL 25 MG PO TABS
25.0000 mg | ORAL_TABLET | Freq: Four times a day (QID) | ORAL | Status: DC
  Administered 2024-08-27 – 2024-08-28 (×3): 25 mg via ORAL
  Filled 2024-08-27 (×3): qty 1

## 2024-08-27 MED ORDER — ENSURE PLUS HIGH PROTEIN PO LIQD
237.0000 mL | Freq: Two times a day (BID) | ORAL | Status: DC
Start: 2024-08-28 — End: 2024-08-30
  Administered 2024-08-29 – 2024-08-30 (×4): 237 mL via ORAL

## 2024-08-27 MED ORDER — SODIUM CHLORIDE 0.9% FLUSH: 10.0000 mL | INTRAVENOUS | Status: DC | PRN

## 2024-08-27 NOTE — Care Management (Addendum)
 Transition of Care [facility]) - Inpatient Brief Assessment   Patient Details  Name: Deborah Mcmahon MRN: [id number] Date of Birth: 1961-09-26  Transition of Care [facility] CM/SW Contact:    Corean JAYSON Canary, RN Phone Number: 08/27/2024, 12:45 PM   Clinical Narrative:  Patient presented with slurred speech. She lives with her SO. PT has recommended CIR. Patient will have 24/7 support post.  SDOH interventions on AVS.  IP care management will follow for needs, recommendations, and transitions of care 1610 Spoke to the patients niece by phone discussed pan of CIR and what happens if the plan changes  Discussed home health as well as DME. The patient  has a good support system, her boyfriend lives with her, her niece and nephew live in [place]. Her niece Ms Elray will be out of town this weekend but can be reached by cell and her brother is on standby in case of any in person needs.  Transition of Care Asessment: Insurance and Status: Insurance coverage has been reviewed Patient has primary care physician: Yes Home environment has been reviewed: Lives with SO Prior level of function:: Independent Prior/Current Home Services: No current home services Social Drivers of Health Review: SDOH reviewed interventions complete Readmission risk has been reviewed: Yes Transition of care needs: transition of care needs identified, TOC will continue to follow

## 2024-08-27 NOTE — Progress Notes (Addendum)
 STROKE TEAM PROGRESS NOTE   SUBJECTIVE (INTERVAL HISTORY) No family at the bedside.  Overall her condition is stable.  Patient  awake alert, follows some commands, still has moderate dysarthria, right facial droop and right sided weakness.  CTA head and neck showed severe stenosis bilaterally and bilateral ICA.  She just returned from swallow eval and was cleared for dysphagia diet.  Patient counseled to quit smoking cigarettes and using cocaine and seems agreeable   OBJECTIVE Temp:  [97.2 F (36.2 C)-98 F (36.7 C)] 97.9 F (36.6 C) (10/03 1200) Pulse Rate:  [75-93] 93 (10/03 1200) Cardiac Rhythm: Normal sinus rhythm (10/03 0703) Resp:  [11-20] 20 (10/03 1200) BP: (139-196)/(78-116) 196/111 (10/03 1200) SpO2:  [98 %-100 %] 98 % (10/03 1200)  Recent Labs  Lab 08/25/24 1631  GLUCAP 194*   Recent Labs  Lab 08/25/24 1628 08/25/24 1640 08/26/24 0022 08/27/24 0228  NA 139 141  --  138  K 3.8 3.8  --  4.0  CL 106 106  --  109  CO2 23  --   --  17*  GLUCOSE 114* 115*  --  94  BUN 26* 28*  --  19  CREATININE 1.64* 1.80* 1.37* 1.05*  CALCIUM  9.0  --   --  9.1  MG  --   --   --  2.1  PHOS  --   --   --  3.7   Recent Labs  Lab 08/25/24 1628 08/27/24 0228  AST 18 23  ALT 20 19  ALKPHOS 57 49  BILITOT 0.3 1.0  PROT 6.9 7.1  ALBUMIN 3.3* 3.2*   Recent Labs  Lab 08/25/24 1628 08/25/24 1640 08/26/24 0022 08/27/24 0228  WBC 5.5  --  6.9 6.7  NEUTROABS 2.7  --   --  4.6  HGB 13.6 14.3 12.9 13.9  HCT 42.3 42.0 40.2 43.2  MCV 89.8  --  88.9 87.1  PLT 221  --  217 236   No results for input(s): CKTOTAL, CKMB, CKMBINDEX, TROPONINI in the last 168 hours. Recent Labs    08/25/24 1628  LABPROT 14.5  INR 1.1   Recent Labs    08/25/24 1740  COLORURINE YELLOW  LABSPEC 1.017  PHURINE 5.0  GLUCOSEU NEGATIVE  HGBUR SMALL*  BILIRUBINUR NEGATIVE  KETONESUR NEGATIVE  PROTEINUR 30*  NITRITE NEGATIVE  LEUKOCYTESUR NEGATIVE       Component Value Date/Time    CHOL 171 08/26/2024 1319   TRIG 61 08/26/2024 1319   HDL 41 08/26/2024 1319   CHOLHDL 4.2 08/26/2024 1319   VLDL 12 08/26/2024 1319   LDLCALC 118 (H) 08/26/2024 1319   Lab Results  Component Value Date   HGBA1C 5.1 08/26/2024      Component Value Date/Time   LABOPIA NONE DETECTED 08/25/2024 1740   COCAINSCRNUR POSITIVE (A) 08/25/2024 1740   LABBENZ NONE DETECTED 08/25/2024 1740   AMPHETMU NONE DETECTED 08/25/2024 1740   THCU NONE DETECTED 08/25/2024 1740   LABBARB NONE DETECTED 08/25/2024 1740    Recent Labs  Lab 08/25/24 1631  ETH <15    I have personally reviewed the radiological images below and agree with the radiology interpretations.  CT ANGIO HEAD NECK W WO CM Addendum Date: 08/26/2024  ADDENDUM #1   ADDENDUM: Findings discussed with Dr. Jerri at 4:27PM on 08/26/24. ---------------------------------------------------- Electronically signed by: Donnice Mania MD 08/26/2024 09:17 PM EDT RP Workstation: [id number]   Result Date: 08/26/2024  ORIGINAL REPORT EXAM: CTA HEAD AND NECK WITHOUT AND WITH 08/26/2024  03:16:09 PM TECHNIQUE: CTA of the head and neck was performed without and with the administration of 65 mL of intravenous iohexol  (OMNIPAQUE ) 350 MG/ML injection. Multiplanar 2D and/or 3D reformatted images are provided for review. Automated exposure control, iterative reconstruction, and/or weight based adjustment of the mA/kV was utilized to reduce the radiation dose to as low as reasonably achievable. Stenosis of the internal carotid arteries measured using NASCET criteria. COMPARISON: MRI head and CT head 08/25/2024 and MRI head and neck 11/15/2020. CLINICAL HISTORY: Stroke/TIA, determine embolic source. FINDINGS: CTA NECK: AORTIC ARCH AND ARCH VESSELS: Mild atherosclerosis of the visualized aortic arch. No dissection or arterial injury. No significant stenosis of the brachiocephalic or subclavian arteries. CERVICAL CAROTID ARTERIES: Right carotid artery: Patent from the origin to  the skull base. Tortuosity of the proximal right carotid artery. Mixed atherosclerotic plaque at the right carotid bifurcation extending into the right carotid bulb with a prominent noncalcified component. There is no hemodynamically significant stenosis. Left carotid artery: Patent from the origin to the skull base. Mixed atherosclerosis at the carotid bifurcation extending into the carotid bulb. There is approximately 55% stenosis of the proximal left cervical ICA. Mild tortuosity of the mid left cervical ICA. CERVICAL VERTEBRAL ARTERIES: Right vertebral artery: Limited evaluation of the origin due to artifact. There is at least moderate to severe stenosis at the right vertebral artery origin. The right vertebral artery is patent from the proximal V1 segment to the vertebrobasilar confluence. Left vertebral artery: Occluded at its origin, similar to 2021. There is reconstitution of the V2 segment of the left vertebral artery around the C5-C6 level. The left vertebral artery is patent from the proximal V2 segment to the vertebrobasilar confluence. LUNGS AND MEDIASTINUM: Emphysema. SOFT TISSUES: Similar prominence of the adenoids; consider nonemergent correlation with direct visualization. Thyroid  nodules, the largest in the left thyroid  lobe measuring up to 1.4 cm. BONES: Degenerative changes in the visualized spine. Anterior cervical fusion hardware at C4-C5 and C6-C7. There is additional solid fusion of the C3 and C4 vertebral bodies. The mandible is edentulous. CTA HEAD: ANTERIOR CIRCULATION: Intracranial internal carotid arteries are patent bilaterally. Atherosclerosis of the carotid siphons. There is mild to moderate stenosis of the right cavernous ICA and mild stenosis of the left cavernous ICA. The middle cerebral arteries are patent bilaterally. The anterior cerebral arteries are patent bilaterally. No aneurysm. POSTERIOR CIRCULATION: The basilar artery is patent proximally with significant distal tapering.  There is occlusion of the distal basilar artery. Reconstitution of the basilar artery at the level of the superior cerebellar artery origins. The posterior inferior cerebellar arteries are patent bilaterally. The superior cerebellar arteries are patent bilaterally. P1 segments noted bilaterally. The posterior cerebral arteries are patent bilaterally and are primarily supplied via the posterior communicating arteries. No aneurysm. 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 Nonspecific hypoattenuation in the periventricular and subcortical white matter, most likely representing chronic microvascular ischemic changes. Mucosal thickening in the right sphenoid sinus. No dural venous sinus thrombosis on this non-dedicated study. IMPRESSION: 1. 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 Reconstitution at the level of the SCA origins likely via the posterior communicating arteries. The PCAs are patent bilaterally and primarily supplied via the posterior communicating arteries. 2. 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 Reconstitution of the V2 segment around the C5-C6 level. 3. Mixed atherosclerosis at the left carotid bifurcation extending into the carotid bulb with approximately 55% stenosis  of the proximal left cervical ICA. 4. Mixed atherosclerotic plaque at the right carotid bifurcation with a prominent noncalcified component. No hemodynamically significant stenosis. 5. Limited evaluation of the right vertebral artery origin due to artifact. There is at least moderate to severe stenosis of the proximal right V1 segment. 6. 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 7. Similar prominence of the adenoids, greater than expected for age. Consider nonemergent correlation with direct visualization. 8. Emphysema. Electronically signed by: Donnice Mania MD 08/26/2024 04:16 PM EDT RP Workstation: [id number]   ECHOCARDIOGRAM COMPLETE Result  Date: 08/26/2024    ECHOCARDIOGRAM REPORT   Patient Name:   Deborah Mcmahon Date of Exam: 08/26/2024 Medical Rec #:  [id number]       Height:       64.0 in Accession #:    [id number]      Weight:       210.0 lb Date of Birth:  01-31-1961       BSA:          1.997 m Patient Age:    63 years        BP:           187/109 mmHg Patient Gender: F               HR:           66 bpm. Exam Location:  Inpatient Procedure: 2D Echo (Both Spectral and Color Flow Doppler were utilized during            procedure). Indications:    Stroke  History:        Patient has prior history of Echocardiogram examinations. Risk                 Factors:Hypertension.  Sonographer:    Charmaine Gaskins Referring Phys: [id number] MOHAMMAD L GARBA  Sonographer Comments: Unable to valsalva for LVOT obstruction. IMPRESSIONS  1. Left ventricular ejection fraction, by estimation, is >75%. Left ventricular ejection fraction by 2D MOD biplane is 78.9 %. The left ventricle has hyperdynamic function. The left ventricle has no regional wall motion abnormalities. There is severe left ventricular hypertrophy. Left ventricular diastolic parameters are consistent with Grade I diastolic dysfunction (impaired relaxation).  2. Right ventricular systolic function is normal. The right ventricular size is normal. Tricuspid regurgitation signal is inadequate for assessing PA pressure.  3. The mitral valve is grossly normal. Trivial mitral valve regurgitation.  4. The aortic valve is tricuspid. Aortic valve regurgitation is not visualized.  5. The inferior vena cava is normal in size with greater than 50% respiratory variability, suggesting right atrial pressure of 3 mmHg. Comparison(s): Changes from prior study are noted. 11/20/2020: LVEF 70-75%. FINDINGS  Left Ventricle: Left ventricular ejection fraction, by estimation, is >75%. Left ventricular ejection fraction by 2D MOD biplane is 78.9 %. The left ventricle has hyperdynamic function. The left ventricle has no regional wall  motion abnormalities. The left ventricular internal cavity size was normal in size. There is severe left ventricular hypertrophy. Left ventricular diastolic parameters are consistent with Grade I diastolic dysfunction (impaired relaxation). Indeterminate filling pressures. Right Ventricle: The right ventricular size is normal. No increase in right ventricular wall thickness. Right ventricular systolic function is normal. Tricuspid regurgitation signal is inadequate for assessing PA pressure. Left Atrium: Left atrial size was normal in size. Right Atrium: Right atrial size was normal in size. Pericardium: There is no evidence of pericardial effusion. Mitral Valve: The mitral valve is grossly normal. Trivial mitral valve regurgitation.  Tricuspid Valve: The tricuspid valve is not well visualized. Tricuspid valve regurgitation is not demonstrated. Aortic Valve: The aortic valve is tricuspid. Aortic valve regurgitation is not visualized. Pulmonic Valve: The pulmonic valve was normal in structure. Pulmonic valve regurgitation is not visualized. Aorta: The aortic root and ascending aorta are structurally normal, with no evidence of dilitation. Venous: The inferior vena cava is normal in size with greater than 50% respiratory variability, suggesting right atrial pressure of 3 mmHg. IAS/Shunts: No atrial level shunt detected by color flow Doppler.  LEFT VENTRICLE PLAX 2D                        Biplane EF (MOD) LVIDd:         4.15 cm         LV Biplane EF:   Left LVIDs:         2.19 cm                          ventricular LV PW:         1.58 cm                          ejection LV IVS:        1.58 cm                          fraction by LVOT diam:     2.19 cm                          2D MOD LVOT Area:     3.77 cm                         biplane is                                                 78.9 %.  LV Volumes (MOD)               Diastology LV vol d, MOD    39.9 ml       LV e' medial:    4.79 cm/s A2C:                            LV E/e' medial:  12.8 LV vol d, MOD    46.7 ml       LV e' lateral:   4.46 cm/s A4C:                           LV E/e' lateral: 13.7 LV vol s, MOD    7.8 ml A2C: LV vol s, MOD    10.4 ml A4C: LV SV MOD A2C:   32.1 ml LV SV MOD A4C:   46.7 ml LV SV MOD BP:    34.7 ml RIGHT VENTRICLE RV Basal diam:  2.08 cm RV Mid diam:    2.28 cm RV S prime:     18.20 cm/s LEFT ATRIUM             Index        RIGHT ATRIUM  Index LA diam:        4.53 cm 2.27 cm/m   RA Area:     13.10 cm LA Vol (A2C):   63.2 ml 31.64 ml/m  RA Volume:   27.40 ml  13.72 ml/m LA Vol (A4C):   49.8 ml 24.93 ml/m LA Biplane Vol: 60.5 ml 30.29 ml/m   AORTA Ao Asc diam: 3.53 cm MITRAL VALVE MV Area (PHT): 2.76 cm    SHUNTS MV Decel Time: 275 msec    Systemic Diam: 2.19 cm MV E velocity: 61.30 cm/s MV A velocity: 96.90 cm/s MV E/A ratio:  0.63 Vinie Maxcy MD Electronically signed by Vinie Maxcy MD Signature Date/Time: 08/26/2024/11:22:51 AM    Final    MR BRAIN WO CONTRAST Result Date: 08/25/2024 EXAM: MRI BRAIN WITHOUT CONTRAST 08/25/2024 06:36:45 PM TECHNIQUE: Multiplanar multisequence MRI of the head/brain was performed without the administration of intravenous contrast. COMPARISON: Earlier same day CT head. CLINICAL HISTORY: Neuro deficit, acute, stroke suspected. FINDINGS: BRAIN AND VENTRICLES: Restricted diffusion in the left basal ganglia involving the posterior body and tail of the caudate nucleus extending into the posterior limb of the internal capsule compatible with acute infarcts. There is encephalomalacia in the left cerebellum compatible with remote infarct. Scattered T2 FLAIR hyperintensity in the periventricular and subcortical white matter with additional signal abnormality in the thalami and pons suggestive of moderate chronic microvascular ischemic changes. Chronic microhemorrhages in the right thalamus. Remote lacunar infarcts in the bilateral basal ganglia. No acute intracranial hemorrhage. No mass. No midline  shift. No hydrocephalus. The sella is unremarkable. Normal flow voids. ORBITS: No acute abnormality. SINUSES AND MASTOIDS: Mucosal thickening in the right sphenoid sinus. BONES AND SOFT TISSUES: Normal marrow signal. No acute soft tissue abnormality. IMPRESSION: 1. Acute infarcts in the left basal ganglia as above. 2. 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 3. Remote lacunar infarcts in the bilateral basal ganglia. 4. Moderate chronic microvascular ischemic changes. Electronically signed by: Donnice Mania MD 08/25/2024 07:35 PM EDT RP Workstation: [id number]   CT HEAD WO CONTRAST Result Date: 08/25/2024 CLINICAL DATA:  Right-sided weakness facial droop EXAM: CT HEAD WITHOUT CONTRAST TECHNIQUE: Contiguous axial images were obtained from the base of the skull through the vertex without intravenous contrast. RADIATION DOSE REDUCTION: This exam was performed according to the departmental dose-optimization program which includes automated exposure control, adjustment of the mA and/or kV according to patient size and/or use of iterative reconstruction technique. COMPARISON:  CT brain 01/20/2024 MRI 11/15/2020 FINDINGS: Brain: No acute territorial infarction, hemorrhage or intracranial mass. Chronic left cerebellar infarct. Widespread bilateral white matter hypodensity most likely chronic small vessel ischemic change. The ventricles are nonenlarged Vascular: No hyperdense vessels.  Carotid vascular calcification Skull: Normal. Negative for fracture or focal lesion. Sinuses/Orbits: No acute finding. Other: None IMPRESSION: 1. No CT evidence for acute intracranial abnormality. 2. Chronic left cerebellar infarct. Widespread bilateral white matter hypodensity most likely chronic small vessel ischemic change. Electronically Signed   By: Luke Bun M.D.   On: 08/25/2024 17:13     PHYSICAL EXAM  Temp:  [97.2 F (36.2 C)-98 F (36.7 C)] 97.9 F (36.6 C) (10/03 1200) Pulse Rate:  [75-93] 93 (10/03 1200) Resp:   [11-20] 20 (10/03 1200) BP: (139-196)/(78-116) 196/111 (10/03 1200) SpO2:  [98 %-100 %] 98 % (10/03 1200)  General - Well nourished, well developed, in no apparent distress.  Ophthalmologic - fundi not visualized due to noncooperation.  Cardiovascular - Regular rhythm and rate.  Neuro - awake, alert, eyes  open, orientated to age, place, time and people. No aphasia but with moderate to severe dysarthria, questions appropriately however paucity of speech, following all simple commands. Able to name and repeat. No gaze palsy, tracking bilaterally, visual field full.  Right facial droop. Tongue midline.  Left upper and lower extremity 5/5, right upper extremity 0/5, right lower extremity 3/5 proximal and 0/5 distally. Sensation symmetrical bilaterally, left FTN intact, gait not tested.    ASSESSMENT/PLAN [name] is a 63 y.o. female with history of hypertension, stroke, migraine, smoker, cocaine user, bilateral CTS admitted for slurred speech, right-sided weakness, right facial droop and hypertension. No TNK given due to outside window.    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  Bilateral ICA mixed atherosclerosis with 50% stenosis on the left ICA. MRI left BG/CR infarct, old bilateral BG infarct and left cerebellar infarct 2D Echo EF more than 75% LDL 118 HgbA1c 5.1 UDS positive for cocaine Lovenox  for VTE prophylaxis clopidogrel  75 mg daily (noncompliant) prior to admission, now on aspirin  81 mg daily and clopidogrel  75 mg daily DAPT for 3 weeks and then Plavix  alone. Patient counseled to be compliant with her antithrombotic medications Ongoing aggressive stroke risk factor management Therapy recommendations:  SNF Disposition:  Pending  History of stroke 10/2020 admitted for large left cerebellar infarct with petechial hemorrhage and edema, small right cerebellar infarct.  MRA head and neck showed left VA occlusion.  Repeat CT stable mass effect.  EF 60 to 65%.  LDL 119, A1c 5.5, UDS positive for cocaine.  Discharged on DAPT 3 months and Lipitor 40 12/2020 admitted for GI bleeding with hemoglobin 6.5.  Status post EGD showed nonbleeding peptic ulcer.  Continued Plavix  alone, aspirin  discontinued.  Intracranial and extradural stenosis CT head and neck mid/distal basilar artery short segment occlusion/near occlusion, reconstituted at the level of SCAs.  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  Bilateral ICA mixed atherosclerosis with 50% stenosis on the left ICA. Likely due to uncontrolled risk factors including smoking cigarette, cocaine use and noncompliant with medication Now on DAPT  Hypertensive urgency Very high BP on presentation Currently BP still high Permissive hypertension for the next 2 days given severe vascular stenosis, and then gradually normalize BP in 3 to 5 days Long term BP goal 130-150 given severe bilateral artery stenosis  Hyperlipidemia Home meds: Lipitor 40, noncompliant LDL 118, goal < 70 Now on Lipitor 80 Continue statin at discharge  Tobacco abuse Current smoker Smoking cessation counseling provided Pt is willing to quit  Cocaine abuse UDS positive for cocaine Cocaine cessation will be provided  Dysphagia Passed swallow earlier today Then became sleepy and currently n.p.o. Need bedside swallow screening again on the floor Advance diet once able.  Other Stroke Risk Factors Medication noncompliance Migraines  Other Active Problems Bilateral CTS  Hospital day # 2   I have personally obtained history,examined this patient, reviewed notes, independently viewed imaging studies, participated in medical decision making and plan of care.ROS completed by me  personally and pertinent positives fully documented  I have made any additions or clarifications directly to the above note. Agree with note above.  Patient continues to have some dysarthria and dense right hemiplegia.  Continue aspirin  and Plavix  for 3 weeks followed by  Plavix  alone and aggressive risk factor modification.  Patient counseled to quit using cocaine and smoking cigarettes seems agreeable.  Continue ongoing therapies and transfer to rehab when bed available.  Discussed with Dr.Dawood.  Stroke team will sign off.  Kindly call for questions   I personally spent a total of 35 minutes in the care of the patient today including getting/reviewing separately obtained history, performing a medically appropriate exam/evaluation, counseling and educating, placing orders, referring and communicating with other health care professionals, documenting clinical information in the EHR, independently interpreting results, and coordinating care.        Eather Popp, MD Medical Director [facility] Pager: [phone] 08/27/2024 4:59 PM  Stroke Neurology 08/27/2024 4:58 PM    To contact Stroke Continuity provider, please refer to [email or web address]. After hours, contact General Neurology

## 2024-08-27 NOTE — Progress Notes (Signed)
 PROGRESS NOTE    Deborah Mcmahon  FMW:[id number] DOB: 01-Feb-1961 DOA: 08/25/2024 PCP: Leontine Cramp, NP  Chief Complaint  Patient presents with   Stroke Symptoms    Brief Narrative:   Deborah Mcmahon is a 63 y.o. female with medical history significant of essential hypertension, hyperlipidemia, previous CVA involving the cerebellar region, migraine headaches, carpal tunnel syndrome, who presented to the ER with slurred speech, right-sided weakness and facial droop.   Assessment & Plan:   Principal Problem:   Acute CVA (cerebrovascular accident) [facility]) Active Problems:   Essential hypertension   Tobacco abuse   CKD (chronic kidney disease), stage III (HCC)   Substance use  Acute Stroke MRI with acute infarcts in L basal ganglia (chronic in L cerebellum, bilateral basal ganglia, chronic microvascular ischemic changes) -CT head and neck mid/distal basilar artery short segment occlusion/near occlusion, reconstituted at the level of SCAs.  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  Bilateral ICA mixed atherosclerosis with 50% stenosis on the left ICA. -MRI left BG/CR infarct, old bilateral BG infarct and left cerebellar infarct -2D Echo EF more than 75% -LDL 118 -HgbA1c 5.1 -UDS positive for cocaine -clopidogrel  75 mg daily (noncompliant) prior to admission, now on aspirin  81 mg daily and clopidogrel  75 mg daily DAPT for 3 weeks and then Plavix  alone per neurology -PT/OT/SLP, commendations for CIR  Dysphagia - plan for MBS study toda  Tobacco abuse - Counseled   Acute Metabolic Encephalopathy -Due to above, improving  Hypertension  -Recommendation for permissive hypertension for at least 2 days from 10/2 given severe stenosis -Resume meds gradually and avoid hypotension -Long-term goal is normotension  CKD IIIb Baseline creatinine appears between 1.2-1.6 Around baseline, trend   Thrush Clotrimazole troche  COPD  Breo ellipta   GERD   PPI  Gout Continue allopurinol  for now  Cocaine abuse -counseled       DVT prophylaxis: lovenox  Code Status: full Family Communication: none Disposition:   Status is: Inpatient Remains inpatient appropriate because: need for continued inpatient care   Consultants:  neurology  Procedures:  Echo pending  Antimicrobials:  Anti-infectives (From admission, onward)    None       Subjective: Patient reports she is hungry, this morning awaiting her MBS study  Objective: Vitals:   08/27/24 0800 08/27/24 0828 08/27/24 1030 08/27/24 1200  BP: (!) 139/94   (!) 196/111  Pulse: 92 90  93  Resp: 20 20 20 20   Temp: 97.8 F (36.6 C)   97.9 F (36.6 C)  TempSrc: Axillary   Axillary  SpO2: 98% 98%  98%    Intake/Output Summary (Last 24 hours) at 08/27/2024 1350 Last data filed at 08/27/2024 0900 Gross per 24 hour  Intake 60 ml  Output 600 ml  Net -540 ml   There were no vitals filed for this visit.  Examination:  Awake Alert, Oriented X 3, significant dysarthria, and right-sided weakness Symmetrical Chest wall movement, Good air movement bilaterally, CTAB RRR,No Gallops,Rubs or new Murmurs, No Parasternal Heave +ve B.Sounds, Abd Soft, No tenderness, No rebound - guarding or rigidity. No Cyanosis, Clubbing or edema, No new Rash or bruise      Data Reviewed: I have personally reviewed following labs and imaging studies  CBC: Recent Labs  Lab 08/25/24 1628 08/25/24 1640 08/26/24 0022 08/27/24 0228  WBC 5.5  --  6.9 6.7  NEUTROABS 2.7  --   --  4.6  HGB 13.6 14.3 12.9 13.9  HCT 42.3 42.0 40.2  43.2  MCV 89.8  --  88.9 87.1  PLT 221  --  217 236    Basic Metabolic Panel: Recent Labs  Lab 08/25/24 1628 08/25/24 1640 08/26/24 0022 08/27/24 0228  NA 139 141  --  138  K 3.8 3.8  --  4.0  CL 106 106  --  109  CO2 23  --   --  17*  GLUCOSE 114* 115*  --  94  BUN 26* 28*  --  19  CREATININE 1.64* 1.80* 1.37* 1.05*  CALCIUM  9.0  --   --  9.1  MG   --   --   --  2.1  PHOS  --   --   --  3.7    GFR: CrCl cannot be calculated (Unknown ideal weight.).  Liver Function Tests: Recent Labs  Lab 08/25/24 1628 08/27/24 0228  AST 18 23  ALT 20 19  ALKPHOS 57 49  BILITOT 0.3 1.0  PROT 6.9 7.1  ALBUMIN 3.3* 3.2*    CBG: Recent Labs  Lab 08/25/24 1631  GLUCAP 194*     No results found for this or any previous visit (from the past 240 hours).         Scheduled Meds:  allopurinol   100 mg Oral Daily   aspirin  EC  81 mg Oral Daily   atorvastatin   80 mg Oral Daily   clopidogrel   75 mg Oral Daily   clotrimazole  10 mg Oral 5 X Daily   enoxaparin  (LOVENOX ) injection  40 mg Subcutaneous Daily   fluticasone  furoate-vilanterol  1 puff Inhalation Daily   pantoprazole   40 mg Oral Daily   sodium chloride  flush  10-40 mL Intracatheter Q12H   Continuous Infusions:  sodium chloride  125 mL/hr at 08/27/24 0230     LOS: 2 days       Brayton Lye, MD [facility]   To contact the attending provider between 7A-7P or the covering provider during after hours 7P-7A, please log into the web site [email or web address] and access using universal [place] password for that web site. If you do not have the password, please call the hospital operator.  08/27/2024, 1:50 PM

## 2024-08-27 NOTE — Progress Notes (Signed)
  Inpatient Rehab Admissions Coordinator :  Per therapy recommendations, patient was screened for CIR candidacy by Ottie Glazier RN MSN.  At this time patient appears to be a potential candidate for CIR. I will place a rehab consult per protocol for full assessment. Please call me with any questions.  Ottie Glazier RN MSN Admissions Coordinator [phone]

## 2024-08-27 NOTE — Plan of Care (Signed)
  Problem: Coping: Goal: Will verbalize positive feelings about self Outcome: Progressing Goal: Will identify appropriate support needs Outcome: Progressing   Problem: Ischemic Stroke/TIA Tissue Perfusion: Goal: Complications of ischemic stroke/TIA will be minimized Outcome: Progressing   Problem: Self-Care: Goal: Ability to participate in self-care as condition permits will improve Outcome: Progressing Goal: Verbalization of feelings and concerns over difficulty with self-care will improve Outcome: Progressing Goal: Ability to communicate needs accurately will improve Outcome: Progressing   Problem: Clinical Measurements: Goal: Ability to maintain clinical measurements within normal limits will improve Outcome: Progressing Goal: Will remain free from infection Outcome: Progressing Goal: Diagnostic test results will improve Outcome: Progressing Goal: Respiratory complications will improve Outcome: Progressing Goal: Cardiovascular complication will be avoided Outcome: Progressing

## 2024-08-27 NOTE — Progress Notes (Signed)

## 2024-08-27 NOTE — Progress Notes (Signed)
 Modified Barium Swallow Study  Patient Details  Name: Deborah Mcmahon MRN: [id number] Date of Birth: [birth year]  Today's Date: 08/27/2024  Modified Barium Swallow completed.  Full report located under Chart Review in the Imaging Section.  History of Present Illness 63 yo female presenting to ED 10/1 with slurred speech, R sided weakness, and R facial droop. MRI shows acute infarcts in the L basal ganglia and remote infarcts in the L cerebellum and bilateral basal ganglia. Pt initially passed the swallow screen but remained NPO due to a change in status overnight, SLP consulted for evaluation 10/2. PMH: substance abuse, prior cerebellar CVA (December 2021), HTN, migraines, carpel tunnel, COPD, gout, cervical surgery pt stated 20 yrs ago.   Clinical Impression Pt presents with mild oropharyngeal dysphagia noted to have a wheezing quality to respirations prior to po's and continued throughout MBS. Oral mastication was slow with lack of lower dentition and lingual residue present with thin. There were episodes of airway intrusion primarily with sequential straw sips thin where oral residue spilled to vocal cords before and cleared during the swallow (PAS 4) and another instance of  brief aspiration below the cords that cleared with reflexive cough. Verbal cues for small straw sips were effective. Efficiency of swallow was overall adequate with trace tongue base, valleculae and pyriform sinus residue of thin infrequently. There was min-mild residue in distal esophagus. Recommend Dys 3 texture, thin liquids, small sips cup/straw, intermittent throat clear, pills whole in puree and rest breaks if becomes SOB. Pt exhibited some impulsivity and will need reminders for adequate pace and volume. ST will continue. Factors that may increase risk of adverse event in presence of aspiration Noe & Lianne 2021): Respiratory or GI disease  Swallow Evaluation Recommendations Recommendations: PO diet PO Diet  Recommendation: Dysphagia 3 (Mechanical soft);Thin liquids (Level 0) Liquid Administration via: Cup;Straw Medication Administration: Whole meds with puree Supervision: Patient able to self-feed;Full supervision/cueing for swallowing strategies;Intermittent supervision/cueing for swallowing strategies Swallowing strategies  : Slow rate;Small bites/sips;Clear throat intermittently Postural changes: Position pt fully upright for meals Oral care recommendations: Oral care BID (2x/day)      Dustin Olam Bull 08/27/2024,5:37 PM

## 2024-08-27 NOTE — Evaluation (Signed)
 Physical Therapy Evaluation Patient Details Name: Deborah Mcmahon MRN: [id number] DOB: November 24, 1961 Today's Date: 08/27/2024  History of Present Illness  Pt is a 63 yo female admitted for slurred speech, R side weakness and R facial droop. 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 PMH: substance abuse, cerevellar CVA, HTN, migraines, carpel tunnel, COPD, gout.  Clinical Impression  Pt admitted with above diagnosis. PTA pt lived at home with her fiance, independent and driving. Pt currently with functional limitations due to the deficits listed below (see PT Problem List). On eval, pt required mod assist bed mobility, and mod assist transfers (toward L). Strength and proprioceptive deficits noted RUE/LE. Increased tone RUE/LE. Pt demo impulsive and anxious affect requiring cues for speed and safety. Pt will benefit from acute skilled PT to increase their independence and safety with mobility to allow discharge. Post acute, recommend further therapy in inpatient setting > 3 hours/day. Pt reports she can arrange 24/7 assist at home upon d/c.            If plan is discharge home, recommend the following: A lot of help with walking and/or transfers;A lot of help with bathing/dressing/bathroom   Can travel by private vehicle        Equipment Recommendations Wheelchair cushion (measurements PT);Wheelchair (measurements PT)  Recommendations for Other Services       Functional Status Assessment Patient has had a recent decline in their functional status and demonstrates the ability to make significant improvements in function in a reasonable and predictable amount of time.     Precautions / Restrictions Precautions Precautions: Fall;Other (comment) Recall of Precautions/Restrictions: Intact Precaution/Restrictions Comments: R hemi      Mobility  Bed Mobility Overal bed mobility: Needs Assistance Bed Mobility: Supine to Sit, Sit to Supine     Supine to sit: Mod assist, Used rails, HOB  elevated Sit to supine: Mod assist, HOB elevated, Used rails   General bed mobility comments: cues for sequencing    Transfers Overall transfer level: Needs assistance Equipment used: 1 person hand held assist Transfers: Sit to/from Stand, Bed to chair/wheelchair/BSC Sit to Stand: Mod assist Stand pivot transfers: Mod assist         General transfer comment: face to face assist. Pt impulsive requiring cues to slow down. Pivot transfer toward L, bed>chair>BSC>chair. Cues for sequencing.    Ambulation/Gait               General Gait Details: unable to progress RLE for gait  Stairs            Wheelchair Mobility     Tilt Bed    Modified Rankin (Stroke Patients Only) Modified Rankin (Stroke Patients Only) Pre-Morbid Rankin Score: No symptoms Modified Rankin: Severe disability     Balance Overall balance assessment: Needs assistance Sitting-balance support: Feet supported, Single extremity supported Sitting balance-Leahy Scale: Fair     Standing balance support: Bilateral upper extremity supported, During functional activity Standing balance-Leahy Scale: Poor                               Pertinent Vitals/Pain Pain Assessment Pain Assessment: No/denies pain    Home Living Family/patient expects to be discharged to:: Private residence Living Arrangements: Spouse/significant other Available Help at Discharge: Friend(s);Available PRN/intermittently;Family Type of Home: House Home Access: Stairs to enter Entrance Stairs-Rails: Right;Left;Can reach both Entrance Stairs-Number of Steps: 2   Home Layout: One level Home Equipment: [place] -  single point;Shower seat Additional Comments: Pt lives with fiance who works full time    Prior Function Prior Level of Function : Independent/Modified Independent;Driving             Mobility Comments: has a cane but never uses it ADLs Comments: fully independent and driving     Extremity/Trunk  Assessment   Upper Extremity Assessment Upper Extremity Assessment: Defer to OT evaluation    Lower Extremity Assessment Lower Extremity Assessment: RLE deficits/detail RLE Deficits / Details: increased tone, decreased isolated joint movement, moving in synergistic pattern, no active movement at ankle RLE Sensation: decreased proprioception    Cervical / Trunk Assessment Cervical / Trunk Assessment: Normal  Communication   Communication Communication: Impaired Factors Affecting Communication: Reduced clarity of speech    Cognition Arousal: Alert Behavior During Therapy: Anxious, Impulsive   PT - Cognitive impairments: Problem solving, Safety/Judgement, Sequencing                         Following commands: Intact       Cueing Cueing Techniques: Verbal cues     General Comments General comments (skin integrity, edema, etc.): VSS on RA    Exercises     Assessment/Plan    PT Assessment Patient needs continued PT services  PT Problem List Decreased strength;Decreased activity tolerance;Decreased knowledge of use of DME;Decreased balance;Decreased mobility;Decreased knowledge of precautions;Decreased safety awareness       PT Treatment Interventions DME instruction;Therapeutic exercise;Balance training;Gait training;Functional mobility training;Therapeutic activities;Patient/family education;Stair training;Neuromuscular re-education    PT Goals (Current goals can be found in the Care Plan section)  Acute Rehab PT Goals Patient Stated Goal: home PT Goal Formulation: With patient Time For Goal Achievement: 09/10/24 Potential to Achieve Goals: Good    Frequency Min 3X/week     Co-evaluation               AM-PAC PT 6 Clicks Mobility  Outcome Measure Help needed turning from your back to your side while in a flat bed without using bedrails?: A Little Help needed moving from lying on your back to sitting on the side of a flat bed without using  bedrails?: A Lot Help needed moving to and from a bed to a chair (including a wheelchair)?: A Lot Help needed standing up from a chair using your arms (e.g., wheelchair or bedside chair)?: A Lot Help needed to walk in hospital room?: Total Help needed climbing 3-5 steps with a railing? : Total 6 Click Score: 11    End of Session Equipment Utilized During Treatment: Gait belt Activity Tolerance: Patient tolerated treatment well Patient left: in bed;with call bell/phone within reach;with bed alarm set Nurse Communication: Mobility status PT Visit Diagnosis: Other abnormalities of gait and mobility (R26.89);Hemiplegia and hemiparesis Hemiplegia - Right/Left: Right Hemiplegia - dominant/non-dominant: Dominant    Time: 9149-9085 PT Time Calculation (min) (ACUTE ONLY): 24 min   Charges:   PT Evaluation $PT Eval Moderate Complexity: 1 Mod PT Treatments $Therapeutic Activity: 8-22 mins PT General Charges $$ ACUTE PT VISIT: 1 Visit         Sari MATSU., PT  Office # [phone]   Erven Sari Shaker 08/27/2024, 12:28 PM

## 2024-08-27 NOTE — Plan of Care (Signed)
  Problem: Self-Care: Goal: Ability to participate in self-care as condition permits will improve Outcome: Progressing Goal: Verbalization of feelings and concerns over difficulty with self-care will improve Outcome: Progressing Goal: Ability to communicate needs accurately will improve Outcome: Progressing   Problem: Clinical Measurements: Goal: Will remain free from infection Outcome: Progressing Goal: Cardiovascular complication will be avoided Outcome: Progressing   Problem: Elimination: Goal: Will not experience complications related to bowel motility Outcome: Progressing Goal: Will not experience complications related to urinary retention Outcome: Progressing   Problem: Pain Managment: Goal: General experience of comfort will improve and/or be controlled Outcome: Progressing   Problem: Safety: Goal: Ability to remain free from injury will improve Outcome: Progressing   Problem: Skin Integrity: Goal: Risk for impaired skin integrity will decrease Outcome: Progressing   Problem: Education: Goal: Knowledge of disease or condition will improve Outcome: Not Progressing Goal: Knowledge of secondary prevention will improve (MUST DOCUMENT ALL) Outcome: Not Progressing   Problem: Ischemic Stroke/TIA Tissue Perfusion: Goal: Complications of ischemic stroke/TIA will be minimized Outcome: Not Progressing   Problem: Coping: Goal: Will verbalize positive feelings about self Outcome: Not Progressing Goal: Will identify appropriate support needs Outcome: Not Progressing   Problem: Health Behavior/Discharge Planning: Goal: Ability to manage health-related needs will improve Outcome: Not Progressing   Problem: Nutrition: Goal: Risk of aspiration will decrease Outcome: Not Progressing Goal: Dietary intake will improve Outcome: Not Progressing   Problem: Health Behavior/Discharge Planning: Goal: Ability to manage health-related needs will improve Outcome: Not  Progressing   Problem: Clinical Measurements: Goal: Ability to maintain clinical measurements within normal limits will improve Outcome: Not Progressing Goal: Respiratory complications will improve Outcome: Not Progressing   Problem: Activity: Goal: Risk for activity intolerance will decrease Outcome: Not Progressing

## 2024-08-28 DIAGNOSIS — I639 Cerebral infarction, unspecified: Secondary | ICD-10-CM | POA: Diagnosis not present

## 2024-08-28 MED ORDER — AMLODIPINE BESYLATE 10 MG PO TABS
10.0000 mg | ORAL_TABLET | Freq: Every day | ORAL | Status: DC
  Administered 2024-08-28 – 2024-08-30 (×3): 10 mg via ORAL
  Filled 2024-08-28 (×3): qty 1

## 2024-08-28 MED ORDER — HYDRALAZINE HCL 20 MG/ML IJ SOLN
10.0000 mg | INTRAMUSCULAR | Status: DC | PRN
  Administered 2024-08-28 – 2024-08-29 (×2): 10 mg via INTRAVENOUS
  Filled 2024-08-28 (×2): qty 1

## 2024-08-28 MED ORDER — ALPRAZOLAM 0.5 MG PO TABS
0.5000 mg | ORAL_TABLET | Freq: Three times a day (TID) | ORAL | Status: DC | PRN
  Administered 2024-08-28 – 2024-08-29 (×3): 0.5 mg via ORAL
  Filled 2024-08-28 (×3): qty 1

## 2024-08-28 MED ORDER — HYDRALAZINE HCL 50 MG PO TABS
50.0000 mg | ORAL_TABLET | Freq: Four times a day (QID) | ORAL | Status: DC
Start: 2024-08-28 — End: 2024-08-29
  Administered 2024-08-28 – 2024-08-29 (×3): 50 mg via ORAL
  Filled 2024-08-28 (×3): qty 1

## 2024-08-28 MED ORDER — AMLODIPINE BESYLATE 5 MG PO TABS: 5.0000 mg | ORAL_TABLET | Freq: Every day | ORAL | Status: DC

## 2024-08-28 NOTE — Plan of Care (Signed)

## 2024-08-28 NOTE — Progress Notes (Signed)
 PROGRESS NOTE    Deborah Mcmahon  FMW:[id number] DOB: 1961/10/14 DOA: 08/25/2024 PCP: Leontine Cramp, NP  Chief Complaint  Patient presents with   Stroke Symptoms    Brief Narrative:   Deborah Mcmahon is a 63 y.o. female with medical history significant of essential hypertension, hyperlipidemia, previous CVA involving the cerebellar region, migraine headaches, carpal tunnel syndrome, who presented to the ER with slurred speech, right-sided weakness and facial droop.   Assessment & Plan:   Principal Problem:   Acute CVA (cerebrovascular accident) [facility]) Active Problems:   Essential hypertension   Tobacco abuse   CKD (chronic kidney disease), stage III (HCC)   Substance use  Acute Stroke MRI with acute infarcts in L basal ganglia (chronic in L cerebellum, bilateral basal ganglia, chronic microvascular ischemic changes) -CT head and neck mid/distal basilar artery short segment occlusion/near occlusion, reconstituted at the level of SCAs.  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  Bilateral ICA mixed atherosclerosis with 50% stenosis on the left ICA. -MRI left BG/CR infarct, old bilateral BG infarct and left cerebellar infarct -2D Echo EF more than 75% -LDL 118 -HgbA1c 5.1 -UDS positive for cocaine -clopidogrel  75 mg daily (noncompliant) prior to admission, now on aspirin  81 mg daily and clopidogrel  75 mg daily DAPT for 3 weeks and then Plavix  alone per neurology -PT/OT/SLP, commendations for CIR  Dysphagia - plan for MBS study toda  Tobacco abuse - Counseled   Acute Metabolic Encephalopathy -Due to above, improving  Hypertension  -Recommendation for permissive hypertension for at least 2 days from 10/2 given severe stenosis -Resume meds gradually and avoid hypotension -Long-term goal is normotension - Increase hydralazine today, will add Norvasc  as well, continue with hydralazine and labetalol  - With significant anxiety which is contributing to her  uncontrolled blood pressure, so added as needed Xanax  CKD IIIb Baseline creatinine appears between 1.2-1.6 Around baseline, trend   Thrush Clotrimazole troche  COPD  Breo ellipta   GERD  PPI  Gout Continue allopurinol  for now  Cocaine abuse -counseled       DVT prophylaxis: lovenox  Code Status: full Family Communication: none Disposition:   Status is: Inpatient Remains inpatient appropriate because: need for continued inpatient care   Consultants:  neurology  Procedures:  Echo pending  Antimicrobials:  Anti-infectives (From admission, onward)    None       Subjective: Patient is significantly anxious, otherwise denies any complaints.  Objective: Vitals:   08/28/24 0424 08/28/24 0800 08/28/24 0822 08/28/24 1201  BP: (!) 197/123 (!) 167/101  (!) 172/115  Pulse:  91  90  Resp: 15 (!) 26  17  Temp: 97.6 F (36.4 C) 97.9 F (36.6 C)  98.2 F (36.8 C)  TempSrc: Axillary Axillary  Axillary  SpO2:   99%     Intake/Output Summary (Last 24 hours) at 08/28/2024 1406 Last data filed at 08/28/2024 0427 Gross per 24 hour  Intake 480 ml  Output 750 ml  Net -270 ml   There were no vitals filed for this visit.  Examination:  Awake Alert, anxious, oriented X 3, significant dysarthria, and right-sided weakness Symmetrical Chest wall movement, Good air movement bilaterally, CTAB RRR,No Gallops,Rubs or new Murmurs, No Parasternal Heave +ve B.Sounds, Abd Soft, No tenderness, No rebound - guarding or rigidity. No Cyanosis, Clubbing or edema, No new Rash or bruise       Data Reviewed: I have personally reviewed following labs and imaging studies  CBC: Recent Labs  Lab 08/25/24 1628  08/25/24 1640 08/26/24 0022 08/27/24 0228  WBC 5.5  --  6.9 6.7  NEUTROABS 2.7  --   --  4.6  HGB 13.6 14.3 12.9 13.9  HCT 42.3 42.0 40.2 43.2  MCV 89.8  --  88.9 87.1  PLT 221  --  217 236    Basic Metabolic Panel: Recent Labs  Lab 08/25/24 1628 08/25/24 1640  08/26/24 0022 08/27/24 0228  NA 139 141  --  138  K 3.8 3.8  --  4.0  CL 106 106  --  109  CO2 23  --   --  17*  GLUCOSE 114* 115*  --  94  BUN 26* 28*  --  19  CREATININE 1.64* 1.80* 1.37* 1.05*  CALCIUM  9.0  --   --  9.1  MG  --   --   --  2.1  PHOS  --   --   --  3.7    GFR: CrCl cannot be calculated (Unknown ideal weight.).  Liver Function Tests: Recent Labs  Lab 08/25/24 1628 08/27/24 0228  AST 18 23  ALT 20 19  ALKPHOS 57 49  BILITOT 0.3 1.0  PROT 6.9 7.1  ALBUMIN 3.3* 3.2*    CBG: Recent Labs  Lab 08/25/24 1631  GLUCAP 194*     No results found for this or any previous visit (from the past 240 hours).         Scheduled Meds:  allopurinol   100 mg Oral Daily   amLODipine   10 mg Oral Daily   aspirin  EC  81 mg Oral Daily   atorvastatin   80 mg Oral Daily   clopidogrel   75 mg Oral Daily   clotrimazole  10 mg Oral 5 X Daily   enoxaparin  (LOVENOX ) injection  40 mg Subcutaneous Daily   feeding supplement  237 mL Oral BID BM   fluticasone  furoate-vilanterol  1 puff Inhalation Daily   hydrALAZINE  50 mg Oral Q6H   pantoprazole   40 mg Oral Daily   sodium chloride  flush  10-40 mL Intracatheter Q12H   Continuous Infusions:     LOS: 3 days       Brayton Lye, MD [facility]   To contact the attending provider between 7A-7P or the covering provider during after hours 7P-7A, please log into the web site [email or web address] and access using universal [place] password for that web site. If you do not have the password, please call the hospital operator.  08/28/2024, 2:06 PM

## 2024-08-28 NOTE — PMR Pre-admission (Signed)
 PMR Admission Coordinator Pre-Admission Assessment  Patient: Deborah Mcmahon is an 63 y.o., female MRN: [id number] DOB: 26-Aug-1961 Height:   Weight:    Insurance Information HMO:     PPO:      PCP:      IPA:      80/20:      OTHER:  PRIMARY: Medicaid of [place]      Policy#: [id number] p      Subscriber: patient CM Name:       Phone#:      Fax#:  Pre-Cert#:       Employer:  Benefits:  Phone #:      Name:  Eff. Date:  08/28/24    Deduct:       Out of Pocket Max:       Life Max:  CIR:  100% coverage     SNF:  Outpatient:      Co-Pay:  Home Health:       Co-Pay:  DME:      Co-Pay:  Providers: in-network SECONDARY:       Policy#:      Phone#:   Artist:       Phone#:   The Data processing manager" for patients in Inpatient Rehabilitation Facilities with attached "Privacy Act Statement-Health Care Records" was provided and verbally reviewed with: {CHL IP Patient Family WJ:[id number]}  Emergency Contact Information Contact Information     Name Relation Home Work Mobile   [place] Niece   [phone]   [facility] Significant other   [phone]      Other Contacts   None on File     Current Medical History  Patient Admitting Diagnosis: CVA History of Present Illness: Pt is a 63 year old female with medical hx significant for: HTN, CVA (10/2020), CKD. Pt presented to [facility] on 08/25/24 d/t right side weakness and dysarthria. Pt hypertensive on arrival with BP 226/122. Pt given labetalol  and Cardizem. CT negative for acute abnormalities. MRI revealed acute left basal ganglia and periventricular white matter ischemic infarction. Neurology consulted. CTA head/neck showed severe stenosis bilaterally and bilateral ICA. Therapy evaluations completed and CIR recommended d/t pt's deficits in functional mobility. Complete NIHSS TOTAL: 8  Patient's medical record from [facility] has been reviewed by the rehabilitation admission coordinator  and physician.  Past Medical History  Past Medical History:  Diagnosis Date   Carpal tunnel syndrome    bilateral   Chronic back pain    spondylolisthesis   Constipation    takes an OTC stool softener daily as needed   Gallstones    History of migraine    Hypertension    Nausea and vomiting 01/20/2024   Nocturia    Weakness    tingling     Has the patient had major surgery during 100 days prior to admission? No  Family History   family history is not on file.  Current Medications  Current Facility-Administered Medications:    acetaminophen  (TYLENOL ) tablet 650 mg, 650 mg, Oral, Q4H PRN, 650 mg at 08/26/24 0026 **OR** acetaminophen  (TYLENOL ) 160 MG/5ML solution 650 mg, 650 mg, Per Tube, Q4H PRN **OR** acetaminophen  (TYLENOL ) suppository 650 mg, 650 mg, Rectal, Q4H PRN, Sim, Mohammad L, MD   allopurinol  (ZYLOPRIM ) tablet 100 mg, 100 mg, Oral, Daily, Perri DELENA Meliton Mickey., MD, 100 mg at 08/28/24 1043   ALPRAZolam SHEFFIELD) tablet 0.5 mg, 0.5 mg, Oral, TID PRN, Elgergawy, Dawood S, MD, 0.5 mg at 08/28/24 1202   amLODipine  (NORVASC ) tablet  10 mg, 10 mg, Oral, Daily, Elgergawy, Dawood S, MD, 10 mg at 08/28/24 1043   aspirin  EC tablet 81 mg, 81 mg, Oral, Daily, Elgergawy, Dawood S, MD, 81 mg at 08/28/24 1043   atorvastatin  (LIPITOR) tablet 80 mg, 80 mg, Oral, Daily, Jerri Pfeiffer, MD, 80 mg at 08/28/24 1043   clopidogrel  (PLAVIX ) tablet 75 mg, 75 mg, Oral, Daily, Sim, Mohammad L, MD, 75 mg at 08/28/24 1043   clotrimazole (MYCELEX) troche 10 mg, 10 mg, Oral, 5 X Daily, Perri DELENA Meliton Mickey., MD, 10 mg at 08/28/24 1417   enoxaparin  (LOVENOX ) injection 40 mg, 40 mg, Subcutaneous, Daily, Sim, Mohammad L, MD, 40 mg at 08/28/24 1043   feeding supplement (ENSURE PLUS HIGH PROTEIN) liquid 237 mL, 237 mL, Oral, BID BM, Elgergawy, Dawood S, MD   fluticasone  furoate-vilanterol (BREO ELLIPTA ) 100-25 MCG/ACT 1 puff, 1 puff, Inhalation, Daily, Perri DELENA Meliton Mickey., MD, 1 puff at 08/28/24 9178    hydrALAZINE (APRESOLINE) injection 10 mg, 10 mg, Intravenous, Q4H PRN, Elgergawy, Dawood S, MD, 10 mg at 08/28/24 1001   hydrALAZINE (APRESOLINE) tablet 50 mg, 50 mg, Oral, Q6H, Elgergawy, Dawood S, MD, 50 mg at 08/28/24 1202   labetalol  (NORMODYNE ) injection 10 mg, 10 mg, Intravenous, Q2H PRN, Perri DELENA Meliton Mickey., MD, 10 mg at 08/28/24 0151   pantoprazole  (PROTONIX ) EC tablet 40 mg, 40 mg, Oral, Daily, Perri DELENA Meliton Mickey., MD, 40 mg at 08/28/24 1044   pneumococcal 20-valent conjugate vaccine (PREVNAR 20) injection 0.5 mL, 0.5 mL, Intramuscular, Prior to discharge, Perri DELENA Meliton Mickey., MD   senna-docusate (Senokot-S) tablet 1 tablet, 1 tablet, Oral, QHS PRN, Sim, Mohammad L, MD   sodium chloride  flush (NS) 0.9 % injection 10-40 mL, 10-40 mL, Intracatheter, Q12H, Elgergawy, Brayton RAMAN, MD, 10 mL at 08/28/24 1044   sodium chloride  flush (NS) 0.9 % injection 10-40 mL, 10-40 mL, Intracatheter, PRN, Elgergawy, Dawood S, MD  Patients Current Diet:  Diet Order             DIET DYS 3 Room service appropriate? Yes with Assist; Fluid consistency: Thin  Diet effective now                   Precautions / Restrictions Precautions Precautions: Fall, Other (comment) Precaution/Restrictions Comments: R hemi Restrictions Weight Bearing Restrictions Per Provider Order: No   Has the patient had 2 or more falls or a fall with injury in the past year? No  Prior Activity Level    Prior Functional Level Self Care: Did the patient need help bathing, dressing, using the toilet or eating? Independent  Indoor Mobility: Did the patient need assistance with walking from room to room (with or without device)? Independent  Stairs: Did the patient need assistance with internal or external stairs (with or without device)? Independent  Functional Cognition: Did the patient need help planning regular tasks such as shopping or remembering to take medications? Independent  Patient Information     Patient's Response To:     Home Assistive Devices / Equipment Home Equipment: Cane - single point, Shower seat  Prior Device Use: Indicate devices/aids used by the patient prior to current illness, exacerbation or injury? None of the above  Current Functional Level Cognition  Arousal/Alertness: Awake/alert Overall Cognitive Status: Impaired/Different from baseline Orientation Level: Oriented to person, Oriented to place, Oriented to time, Disoriented to situation Attention: Sustained Sustained Attention: Impaired Sustained Attention Impairment: Verbal basic, Functional basic Memory: Appears intact Awareness: Impaired Awareness Impairment: Intellectual impairment Problem  Solving: Impaired Problem Solving Impairment: Verbal basic, Functional basic    Extremity Assessment (includes Sensation/Coordination)  Upper Extremity Assessment: Defer to OT evaluation RUE Deficits / Details: PROM WFL with a lot of tone. No active functional movement noted. RUE Sensation: WNL RUE Coordination: decreased fine motor, decreased gross motor  Lower Extremity Assessment: RLE deficits/detail RLE Deficits / Details: increased tone, decreased isolated joint movement, moving in synergistic pattern, no active movement at ankle RLE Sensation: decreased proprioception    ADLs  Overall ADL's : Needs assistance/impaired Eating/Feeding: NPO Grooming: Oral care, Wash/dry face, Wash/dry hands, Moderate assistance, Applying deodorant, Brushing hair, Sitting Grooming Details (indicate cue type and reason): no movement in R side Upper Body Bathing: Moderate assistance, Sitting Lower Body Bathing: Maximal assistance, Sit to/from stand, Cueing for compensatory techniques Upper Body Dressing : Maximal assistance, Sitting Lower Body Dressing: Moderate assistance, Sit to/from stand, Cueing for compensatory techniques Toilet Transfer: Moderate assistance, Stand-pivot, BSC/3in1 (to strong side) Toilet Transfer  Details (indicate cue type and reason): to strong side Toileting- Clothing Manipulation and Hygiene: Maximal assistance, Sit to/from stand Functional mobility during ADLs: Maximal assistance General ADL Comments: Pt very limited with all adls due to no movement in RUE. Pt moves RLE some but pt with a great amount of tone in both RUE and RLE    Mobility  Overal bed mobility: Needs Assistance Bed Mobility: Supine to Sit Supine to sit: Mod assist, HOB elevated Sit to supine: Mod assist, HOB elevated, Used rails General bed mobility comments: assist to bring R LE off EOB and support R UE    Transfers  Overall transfer level: Needs assistance Equipment used: 1 person hand held assist Transfers: Sit to/from Stand, Bed to chair/wheelchair/BSC Sit to Stand: Min assist Bed to/from chair/wheelchair/BSC transfer type:: Stand pivot Stand pivot transfers: Mod assist General transfer comment: MinA to rise and ModA to stand-pivot. Face to face transfer with use of gait belt    Ambulation / Gait / Stairs / Wheelchair Mobility  Ambulation/Gait General Gait Details: deferred due to need for +2 assist    Posture / Balance Dynamic Sitting Balance Sitting balance - Comments: intermittent MinA due to anterior lean from pt falling asleep Balance Overall balance assessment: Needs assistance Sitting-balance support: Feet supported, Single extremity supported Sitting balance-Leahy Scale: Poor Sitting balance - Comments: intermittent MinA due to anterior lean from pt falling asleep Postural control: Other (comment) (anterior) Standing balance support: Bilateral upper extremity supported, During functional activity Standing balance-Leahy Scale: Poor    Special considerations/life events  Bowel and Bladder Incontinence   Previous Home Environment (from acute therapy documentation) Living Arrangements: Spouse/significant other  Lives With: Significant other Available Help at Discharge: Friend(s),  Available PRN/intermittently, Family Type of Home: House Home Layout: One level Home Access: Stairs to enter Entrance Stairs-Rails: Right, Left, Can reach both Entrance Stairs-Number of Steps: 2 Bathroom Shower/Tub: Tub/shower unit, Curtain, Sport and exercise psychologist: Standard Home Care Services: No Additional Comments: Pt lives with fiance who works full time  Discharge Living Setting Does the patient have any problems obtaining your medications?: No  Social/Family/Support Systems    Goals    Decrease burden of Care through IP rehab admission: NA  Possible need for SNF placement upon discharge: Not anticipated  Patient Condition: I have reviewed medical records from [facility], spoken with CM, and patient and family member. I met with patient at the bedside and discussed via phone for inpatient rehabilitation assessment.  Patient will benefit from ongoing PT, OT, and SLP,  can actively participate in 3 hours of therapy a day 5 days of the week, and can make measurable gains during the admission.  Patient will also benefit from the coordinated team approach during an Inpatient Acute Rehabilitation admission.  The patient will receive intensive therapy as well as Rehabilitation physician, nursing, social worker, and care management interventions.  Due to bladder management, bowel management, safety, disease management, medication administration, pain management, and patient education the patient requires 24 hour a day rehabilitation nursing.  The patient is currently *** with mobility and basic ADLs.  Discharge setting and therapy post discharge at home with home health is anticipated.  Patient has agreed to participate in the Acute Inpatient Rehabilitation Program and will admit {Time; today/tomorrow:10263}.  Preadmission Screen Completed By:  Tinnie SHAUNNA Yvone Delayne, 08/28/2024 3:38 PM ______________________________________________________________________   Discussed status with Dr. PIERRETTE  on *** at *** and received approval for admission today.  Admission Coordinator:  Tinnie SHAUNNA Yvone Delayne, CCC-SLP, time ***/Date ***   Assessment/Plan: Diagnosis: *** Does the need for close, 24 hr/day Medical supervision in concert with the patient's rehab needs make it unreasonable for this patient to be served in a less intensive setting? {yes_no_potentially:3041433} Co-Morbidities requiring supervision/potential complications: *** Due to {due un:6958565}, does the patient require 24 hr/day rehab nursing? {yes_no_potentially:3041433} Does the patient require coordinated care of a physician, rehab nurse, PT, OT, and SLP to address physical and functional deficits in the context of the above medical diagnosis(es)? {yes_no_potentially:3041433} Addressing deficits in the following areas: {deficits:3041436} Can the patient actively participate in an intensive therapy program of at least 3 hrs of therapy 5 days a week? {yes_no_potentially:3041433} The potential for patient to make measurable gains while on inpatient rehab is {potential:3041437} Anticipated functional outcomes upon discharge from inpatient rehab: {functional outcomes:304600100} PT, {functional outcomes:304600100} OT, {functional outcomes:304600100} SLP Estimated rehab length of stay to reach the above functional goals is: *** Anticipated discharge destination: {anticipated dc setting:21604} 10. Overall Rehab/Functional Prognosis: {potential:3041437}   MD Signature: ***

## 2024-08-28 NOTE — Progress Notes (Signed)
 Physical Therapy Treatment Patient Details Name: Deborah Mcmahon MRN: [id number] DOB: Sep 24, 1961 Today's Date: 08/28/2024   History of Present Illness Pt is a 63 yo female admitted for slurred speech, R side weakness and R facial droop. 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 PMH: substance abuse, cerevellar CVA, HTN, migraines, carpel tunnel, COPD, gout.   PT Comments  Pt asleep upon arrival with increased time to maintain alertness. Pt was lethargic during session and required constant stimulation to engage in session. Pt was able to stand with MinA and perform stand-pivot transfer with ModA, face to face transfer and use of gait belt. Able to perform one LE exercise once in the recliner with pt then nodding off. Anticipate pt will continue to progress well with increased alertness. Continue to recommend >3hrs post acute rehab with acute PT to follow.    If plan is discharge home, recommend the following: A lot of help with walking and/or transfers;A lot of help with bathing/dressing/bathroom     Equipment Recommendations  Wheelchair cushion (measurements PT);Wheelchair (measurements PT)       Precautions / Restrictions Precautions Precautions: Fall;Other (comment) Recall of Precautions/Restrictions: Intact Precaution/Restrictions Comments: R hemi Restrictions Weight Bearing Restrictions Per Provider Order: No     Mobility  Bed Mobility Overal bed mobility: Needs Assistance Bed Mobility: Supine to Sit    Supine to sit: Mod assist, HOB elevated    General bed mobility comments: assist to bring R LE off EOB and support R UE    Transfers Overall transfer level: Needs assistance Equipment used: 1 person hand held assist Transfers: Sit to/from Stand, Bed to chair/wheelchair/BSC Sit to Stand: Min assist Stand pivot transfers: Mod assist    General transfer comment: MinA to rise and ModA to stand-pivot. Face to face transfer with use of gait belt    Ambulation/Gait     General Gait Details: deferred due to need for +2 assist   Modified Rankin (Stroke Patients Only) Modified Rankin (Stroke Patients Only) Pre-Morbid Rankin Score: No symptoms Modified Rankin: Severe disability     Balance Overall balance assessment: Needs assistance Sitting-balance support: Feet supported, Single extremity supported Sitting balance-Leahy Scale: Poor Sitting balance - Comments: intermittent MinA due to anterior lean from pt falling asleep Postural control: Other (comment) (anterior) Standing balance support: Bilateral upper extremity supported, During functional activity Standing balance-Leahy Scale: Poor     Communication Communication Communication: Impaired Factors Affecting Communication: Reduced clarity of speech  Cognition Arousal: Lethargic, Obtunded Behavior During Therapy: WFL for tasks assessed/performed   PT - Cognitive impairments: Problem solving, Safety/Judgement, Sequencing    PT - Cognition Comments: Needed constant stimulation to maintain alertness Following commands: Impaired Following commands impaired: Only follows one step commands consistently, Follows multi-step commands inconsistently    Cueing Cueing Techniques: Verbal cues, Tactile cues  Exercises General Exercises - Lower Extremity Long Arc Quad: AAROM, Right, 10 reps, Seated    General Comments        Pertinent Vitals/Pain Pain Assessment Pain Assessment: No/denies pain     PT Goals (current goals can now be found in the care plan section) Acute Rehab PT Goals Patient Stated Goal: to go home PT Goal Formulation: With patient Time For Goal Achievement: 09/10/24 Potential to Achieve Goals: Good Progress towards PT goals: Progressing toward goals    Frequency    Min 3X/week       AM-PAC PT 6 Clicks Mobility   Outcome Measure  Help needed turning from your back to your side  while in a flat bed without using bedrails?: A Lot Help needed moving from lying on  your back to sitting on the side of a flat bed without using bedrails?: A Lot Help needed moving to and from a bed to a chair (including a wheelchair)?: A Lot Help needed standing up from a chair using your arms (e.g., wheelchair or bedside chair)?: A Little Help needed to walk in hospital room?: Total Help needed climbing 3-5 steps with a railing? : Total 6 Click Score: 11    End of Session Equipment Utilized During Treatment: Gait belt Activity Tolerance: Patient tolerated treatment well Patient left: in chair;with call bell/phone within reach;with chair alarm set Nurse Communication: Mobility status;Other (comment) (present during middle of session) PT Visit Diagnosis: Other abnormalities of gait and mobility (R26.89);Hemiplegia and hemiparesis Hemiplegia - Right/Left: Right Hemiplegia - dominant/non-dominant: Dominant     Time: 8590-8571 PT Time Calculation (min) (ACUTE ONLY): 19 min  Charges:    $Therapeutic Activity: 8-22 mins PT General Charges $$ ACUTE PT VISIT: 1 Visit                    Kate ORN, PT, DPT Secure Chat Preferred  Rehab Office [phone]   Kate BRAVO Deborah Mcmahon 08/28/2024, 2:43 PM

## 2024-08-28 NOTE — Progress Notes (Signed)
 Inpatient Rehab Admissions:  Inpatient Rehab Consult received.  I met with patient at the bedside for rehabilitation assessment and to discuss goals and expectations of an inpatient rehab admission.  Discussed average length of stay and discharge home after completion of CIR. Pt acknowledged understanding and is interested in pursuing CIR. Pt gave permission to contact family. Spoke with pt's niece [name] on the telephone. She also acknowledged understanding of CIR goals and expectations. She is supportive of pt pursuing CIR. She confirmed that pt will have support after discharge.  Also attempted to contact pt's significant other [name]. Not able to leave a message. Will continue to follow.  Signed: Tinnie Yvone Cohens, MS, CCC-SLP Admissions Coordinator [phone]

## 2024-08-29 DIAGNOSIS — I639 Cerebral infarction, unspecified: Secondary | ICD-10-CM | POA: Diagnosis not present

## 2024-08-29 MED ORDER — ALPRAZOLAM 0.5 MG PO TABS
0.5000 mg | ORAL_TABLET | Freq: Once | ORAL | Status: AC
  Administered 2024-08-29: 0.5 mg via ORAL
  Filled 2024-08-29: qty 1

## 2024-08-29 MED ORDER — LORATADINE 10 MG PO TABS
10.0000 mg | ORAL_TABLET | Freq: Every day | ORAL | Status: DC | PRN
  Administered 2024-08-29: 10 mg via ORAL
  Filled 2024-08-29: qty 1

## 2024-08-29 MED ORDER — MUSCLE RUB 10-15 % EX CREA
TOPICAL_CREAM | Freq: Three times a day (TID) | CUTANEOUS | Status: DC | PRN
  Administered 2024-08-29: 1 via TOPICAL
  Filled 2024-08-29: qty 85

## 2024-08-29 MED ORDER — HYDRALAZINE HCL 50 MG PO TABS
75.0000 mg | ORAL_TABLET | Freq: Four times a day (QID) | ORAL | Status: DC
  Administered 2024-08-29 – 2024-08-30 (×3): 75 mg via ORAL
  Filled 2024-08-29 (×3): qty 1

## 2024-08-29 NOTE — Plan of Care (Signed)
  Problem: Education: Goal: Knowledge of disease or condition will improve Outcome: Progressing Goal: Knowledge of secondary prevention will improve (MUST DOCUMENT ALL) Outcome: Progressing Goal: Knowledge of patient specific risk factors will improve (DELETE if not current risk factor) Outcome: Progressing   Problem: Ischemic Stroke/TIA Tissue Perfusion: Goal: Complications of ischemic stroke/TIA will be minimized Outcome: Progressing   Problem: Self-Care: Goal: Ability to participate in self-care as condition permits will improve Outcome: Progressing   Problem: Nutrition: Goal: Risk of aspiration will decrease Outcome: Progressing   Problem: Nutrition: Goal: Adequate nutrition will be maintained Outcome: Progressing   Problem: Safety: Goal: Ability to remain free from injury will improve Outcome: Progressing   Problem: Skin Integrity: Goal: Risk for impaired skin integrity will decrease Outcome: Progressing

## 2024-08-29 NOTE — Progress Notes (Signed)
 PROGRESS NOTE    Deborah Mcmahon  FMW:[id number] DOB: 09/19/1961 DOA: 08/25/2024 PCP: Leontine Cramp, NP  Chief Complaint  Patient presents with   Stroke Symptoms    Brief Narrative:   Deborah Mcmahon is a 62 y.o. female with medical history significant of essential hypertension, hyperlipidemia, previous CVA involving the cerebellar region, migraine headaches, carpal tunnel syndrome, who presented to the ER with slurred speech, right-sided weakness and facial droop.   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  Bilateral ICA mixed atherosclerosis with 50% stenosis on the left ICA. -MRI left BG/CR infarct, old bilateral BG infarct and left cerebellar infarct -2D Echo EF more than 75% -LDL 118 -HgbA1c 5.1 -UDS positive for cocaine -clopidogrel  75 mg daily (noncompliant) prior to admission, now on aspirin  81 mg daily and clopidogrel  75 mg daily DAPT for 3 weeks and then Plavix  alone per neurology -PT/OT/SLP, commendations for CIR  Dysphagia - This post BMS, started on diet  Tobacco abuse - Counseled   Acute Metabolic Encephalopathy -Due to above, improving  Hypertension  -Recommendation for permissive hypertension for at least 2 days from 10/2 given severe stenosis -Resume meds gradually and avoid hypotension -Long-term goal is normotension - Blood pressure improved, but remains elevated so  will increase hydralazine, will add Norvasc  as well, continue with hydralazine and labetalol  - With  significant anxiety which is contributing to her uncontrolled blood pressure, so added as needed Xanax  CKD IIIb Baseline creatinine appears between 1.2-1.6 Around baseline, trend   Thrush Clotrimazole troche  COPD  Breo ellipta   GERD  PPI  Gout Continue allopurinol  for now  Cocaine abuse -counseled       DVT prophylaxis: lovenox  Code Status: full Family Communication: none Disposition:   Status is: Inpatient Remains inpatient appropriate because: need for continued inpatient care   Consultants:  neurology  Procedures:  Echo pending  Antimicrobials:  Anti-infectives (From admission, onward)    None       Subjective: Patient is significantly anxious, otherwise denies any complaints.  Objective: Vitals:   08/29/24 0800 08/29/24 0850 08/29/24 1029 08/29/24 1235  BP: (!) 154/78   (!) 144/94  Pulse: 81   94  Resp: 20 (!) 28 20 20   Temp: 97.6 F (36.4 C)     TempSrc: Axillary     SpO2: 98%   97%   No intake or output data in the 24 hours ending 08/29/24 1321  There were no vitals filed for this visit.  Examination:  Awake Alert, oriented X 3, significant dysarthria, and right-sided weakness Symmetrical Chest wall movement, Good air movement bilaterally, CTAB RRR,No Gallops,Rubs or new Murmurs, No Parasternal Heave +ve B.Sounds, Abd Soft, No tenderness, No rebound - guarding or rigidity. No Cyanosis, Clubbing or edema, No new Rash or bruise       Data Reviewed: I have personally reviewed following labs and imaging studies  CBC: Recent Labs  Lab 08/25/24 1628 08/25/24 1640 08/26/24 0022 08/27/24 0228  WBC 5.5  --  6.9 6.7  NEUTROABS 2.7  --   --  4.6  HGB 13.6 14.3 12.9 13.9  HCT 42.3 42.0 40.2 43.2  MCV 89.8  --  88.9 87.1  PLT 221  --  217 236    Basic Metabolic Panel: Recent Labs  Lab 08/25/24 1628 08/25/24 1640 08/26/24 0022 08/27/24 0228  NA 139 141  --  138  K 3.8 3.8  --  4.0  CL 106 106  --  109  CO2 23  --   --   17*  GLUCOSE 114* 115*  --  94  BUN 26* 28*  --  19  CREATININE 1.64* 1.80* 1.37* 1.05*  CALCIUM  9.0  --   --  9.1  MG  --   --   --  2.1  PHOS  --   --   --  3.7    GFR: CrCl cannot be calculated (Unknown ideal weight.).  Liver Function Tests: Recent Labs  Lab 08/25/24 1628 08/27/24 0228  AST 18 23  ALT 20 19  ALKPHOS 57 49  BILITOT 0.3 1.0  PROT 6.9 7.1  ALBUMIN 3.3* 3.2*    CBG: Recent Labs  Lab 08/25/24 1631  GLUCAP 194*     No results found for this or any previous visit (from the past 240 hours).         Scheduled Meds:  allopurinol   100 mg Oral Daily   amLODipine   10 mg Oral Daily   aspirin  EC  81 mg Oral Daily   atorvastatin   80 mg Oral Daily   clopidogrel   75 mg Oral Daily   clotrimazole  10 mg Oral 5 X Daily   enoxaparin  (LOVENOX ) injection  40 mg Subcutaneous Daily   feeding supplement  237 mL Oral BID BM   fluticasone  furoate-vilanterol  1 puff Inhalation Daily   hydrALAZINE  75 mg Oral Q6H   pantoprazole   40 mg Oral Daily   sodium chloride  flush  10-40 mL Intracatheter Q12H   Continuous Infusions:     LOS: 4 days       Brayton Lye, MD [facility]   To contact the attending provider between 7A-7P or the covering provider during after hours 7P-7A, please log into the web site [email or web address] and access using universal [place] password for that web site. If you do not have the password, please call the hospital operator.  08/29/2024, 1:21 PM

## 2024-08-29 NOTE — Progress Notes (Signed)
 Upon initial assessment this shift patient with c/o lateral right side pain. RUE and RLE deficits s/t CVA requiring transfer assistance on this side, patient described pain as a pulling, pressure. Consider muscular. General Admission PRN order entered for [facility] for muscle pain. PRN Tylenol , existing order, administered.   Patient also continues per report with cough, congestion, and phlegm. General Admission PRN order entered for Claritin for allergy symptoms.   PRN Xanax order appeared to have expired. On-call MD Franky notified via secure chat. Informed medication has been effective since initiation and patient has tolerated previous doses. Patient was also requesting the medication. MD entered new order.

## 2024-08-29 NOTE — Plan of Care (Signed)
 Problem: Education: Goal: Knowledge of disease or condition will improve 08/29/2024 0556 by Vayla Wilhelmi, , RN Outcome: Progressing 08/28/2024 2033 by Antonia Culbertson, RN Outcome: Progressing Goal: Knowledge of secondary prevention will improve (MUST DOCUMENT ALL) 08/29/2024 0556 by Katharine Smolder, RN Outcome: Progressing 08/28/2024 2033 by Katharine Smolder, RN Outcome: Progressing Goal: Knowledge of patient specific risk factors will improve (DELETE if not current risk factor) 08/29/2024 0556 by Katharine Smolder, RN Outcome: Progressing 08/28/2024 2033 by Katharine Smolder, RN Outcome: Progressing   Problem: Ischemic Stroke/TIA Tissue Perfusion: Goal: Complications of ischemic stroke/TIA will be minimized 08/29/2024 0556 by Natalea Sutliff, RN Outcome: Progressing 08/28/2024 2033 by Barrington Worley, Smolder, RN Outcome: Progressing   Problem: Coping: Goal: Will verbalize positive feelings about self 08/29/2024 0556 by Katharine Smolder, RN Outcome: Progressing 08/28/2024 2033 by Katharine Smolder, RN Outcome: Progressing Goal: Will identify appropriate support needs 08/29/2024 0556 by Ameen Mostafa, Smolder, RN Outcome: Progressing 08/28/2024 2033 by Katharine Smolder, RN Outcome: Progressing   Problem: Health Behavior/Discharge Planning: Goal: Ability to manage health-related needs will improve 08/29/2024 0556 by Talyssa Gibas, Smolder, RN Outcome: Progressing 08/28/2024 2033 by Katharine Smolder, RN Outcome: Progressing Goal: Goals will be collaboratively established with patient/family 08/29/2024 0556 by Katharine Smolder, RN Outcome: Progressing 08/28/2024 2033 by Katharine Smolder, RN Outcome: Progressing   Problem: Self-Care: Goal: Ability to participate in self-care as condition permits will improve 08/29/2024 0556 by Jemila Camille, RN Outcome: Progressing 08/28/2024 2033 by Katharine Smolder, RN Outcome: Progressing Goal: Verbalization of feelings and concerns over difficulty with self-care will improve 08/29/2024 0556 by Yoandri Congrove, Smolder, RN Outcome: Progressing 08/28/2024 2033 by Katharine Smolder,  RN Outcome: Progressing Goal: Ability to communicate needs accurately will improve 08/29/2024 0556 by Solae Norling, Smolder, RN Outcome: Progressing 08/28/2024 2033 by Katharine Smolder, RN Outcome: Progressing   Problem: Nutrition: Goal: Risk of aspiration will decrease 08/29/2024 0556 by Daril Warga, Smolder, RN Outcome: Progressing 08/28/2024 2033 by Katharine Smolder, RN Outcome: Progressing Goal: Dietary intake will improve 08/29/2024 0556 by Musa Rewerts, Smolder, RN Outcome: Progressing 08/28/2024 2033 by Katharine Smolder, RN Outcome: Progressing   Problem: Education: Goal: Knowledge of General Education information will improve Description: Including pain rating scale, medication(s)/side effects and non-pharmacologic comfort measures 08/29/2024 0556 by Paulita Licklider, Smolder, RN Outcome: Progressing 08/28/2024 2033 by Katharine Smolder, RN Outcome: Progressing   Problem: Health Behavior/Discharge Planning: Goal: Ability to manage health-related needs will improve 08/29/2024 0556 by Adreanna Fickel, RN Outcome: Progressing 08/28/2024 2033 by Katharine Smolder, RN Outcome: Progressing   Problem: Clinical Measurements: Goal: Ability to maintain clinical measurements within normal limits will improve 08/29/2024 0556 by , Smolder, RN Outcome: Progressing 08/28/2024 2033 by Katharine Smolder, RN Outcome: Progressing Goal: Will remain free from infection 08/29/2024 0556 by Katharine Smolder, RN Outcome: Progressing 08/28/2024 2033 by Katharine Smolder, RN Outcome: Progressing Goal: Diagnostic test results will improve 08/29/2024 0556 by Katharine Smolder, RN Outcome: Progressing 08/28/2024 2033 by Katharine Smolder, RN Outcome: Progressing Goal: Respiratory complications will improve 08/29/2024 0556 by Katharine Smolder, RN Outcome: Progressing 08/28/2024 2033 by Katharine Smolder, RN Outcome: Progressing Goal: Cardiovascular complication will be avoided 08/29/2024 0556 by Katharine Smolder, RN Outcome: Progressing 08/28/2024 2033 by Katharine Smolder, RN Outcome: Progressing   Problem: Activity: Goal: Risk for  activity intolerance will decrease 08/29/2024 0556 by Jeneane Pieczynski, RN Outcome: Progressing 08/28/2024 2033 by Landi Biscardi, RN Outcome: Progressing   Problem: Nutrition: Goal: Adequate nutrition will be maintained 08/29/2024 0556 by Athens Lebeau, RN Outcome: Progressing 08/28/2024 2033 by Khadeem Rockett, RN Outcome: Progressing   Problem: Coping: Goal: Level of anxiety will decrease  08/29/2024 0556 by Katharine Smolder, RN Outcome: Progressing 08/28/2024 2033 by Katharine Smolder, RN Outcome: Progressing   Problem: Elimination: Goal: Will not experience complications related to bowel motility 08/29/2024 0556 by Niurka Benecke, RN Outcome: Progressing 08/28/2024 2033 by Katharine Smolder, RN Outcome: Progressing Goal: Will not experience complications related to urinary retention 08/29/2024 0556 by Priti Consoli, RN Outcome: Progressing 08/28/2024 2033 by Audon Heymann, RN Outcome: Progressing   Problem: Pain Managment: Goal: General experience of comfort will improve and/or be controlled 08/29/2024 0556 by Dyshaun Bonzo, RN Outcome: Progressing 08/28/2024 2033 by Mallorey Odonell, Smolder, RN Outcome: Progressing   Problem: Safety: Goal: Ability to remain free from injury will improve 08/29/2024 0556 by Abbigal Radich, RN Outcome: Progressing 08/28/2024 2033 by Katharine Smolder, RN Outcome: Progressing   Problem: Skin Integrity: Goal: Risk for impaired skin integrity will decrease 08/29/2024 0556 by Katharine Smolder, RN Outcome: Progressing 08/28/2024 2033 by Katharine Smolder, RN Outcome: Progressing

## 2024-08-30 ENCOUNTER — Encounter (HOSPITAL_COMMUNITY): Payer: Self-pay | Admitting: Physical Medicine & Rehabilitation

## 2024-08-30 ENCOUNTER — Other Ambulatory Visit: Payer: Self-pay

## 2024-08-30 ENCOUNTER — Inpatient Hospital Stay (HOSPITAL_COMMUNITY)
Admission: AD | Admit: 2024-08-30 | Discharge: 2024-08-31 | DRG: 057 | Discharge status: Against Medical Advice | Source: Intra-hospital | Attending: Physical Medicine & Rehabilitation | Admitting: Physical Medicine & Rehabilitation

## 2024-08-30 DIAGNOSIS — M545 Low back pain, unspecified: Secondary | ICD-10-CM | POA: Diagnosis present

## 2024-08-30 DIAGNOSIS — I69351 Hemiplegia and hemiparesis following cerebral infarction affecting right dominant side: Principal | ICD-10-CM

## 2024-08-30 DIAGNOSIS — M109 Gout, unspecified: Secondary | ICD-10-CM | POA: Diagnosis present

## 2024-08-30 DIAGNOSIS — F17210 Nicotine dependence, cigarettes, uncomplicated: Secondary | ICD-10-CM | POA: Diagnosis present

## 2024-08-30 DIAGNOSIS — E785 Hyperlipidemia, unspecified: Secondary | ICD-10-CM | POA: Diagnosis present

## 2024-08-30 DIAGNOSIS — Z79899 Other long term (current) drug therapy: Secondary | ICD-10-CM | POA: Diagnosis not present

## 2024-08-30 DIAGNOSIS — J449 Chronic obstructive pulmonary disease, unspecified: Secondary | ICD-10-CM | POA: Diagnosis present

## 2024-08-30 DIAGNOSIS — Z7951 Long term (current) use of inhaled steroids: Secondary | ICD-10-CM | POA: Diagnosis not present

## 2024-08-30 DIAGNOSIS — G8191 Hemiplegia, unspecified affecting right dominant side: Secondary | ICD-10-CM | POA: Diagnosis not present

## 2024-08-30 DIAGNOSIS — I639 Cerebral infarction, unspecified: Principal | ICD-10-CM | POA: Diagnosis present

## 2024-08-30 DIAGNOSIS — B379 Candidiasis, unspecified: Secondary | ICD-10-CM | POA: Diagnosis present

## 2024-08-30 DIAGNOSIS — I10 Essential (primary) hypertension: Secondary | ICD-10-CM | POA: Diagnosis present

## 2024-08-30 DIAGNOSIS — F1411 Cocaine abuse, in remission: Secondary | ICD-10-CM | POA: Diagnosis not present

## 2024-08-30 DIAGNOSIS — Z7902 Long term (current) use of antithrombotics/antiplatelets: Secondary | ICD-10-CM

## 2024-08-30 DIAGNOSIS — J42 Unspecified chronic bronchitis: Secondary | ICD-10-CM | POA: Diagnosis not present

## 2024-08-30 DIAGNOSIS — I129 Hypertensive chronic kidney disease with stage 1 through stage 4 chronic kidney disease, or unspecified chronic kidney disease: Secondary | ICD-10-CM | POA: Diagnosis present

## 2024-08-30 DIAGNOSIS — F1990 Other psychoactive substance use, unspecified, uncomplicated: Secondary | ICD-10-CM | POA: Diagnosis present

## 2024-08-30 DIAGNOSIS — I69319 Unspecified symptoms and signs involving cognitive functions following cerebral infarction: Secondary | ICD-10-CM

## 2024-08-30 DIAGNOSIS — G8929 Other chronic pain: Secondary | ICD-10-CM | POA: Diagnosis present

## 2024-08-30 DIAGNOSIS — I69322 Dysarthria following cerebral infarction: Secondary | ICD-10-CM | POA: Diagnosis not present

## 2024-08-30 DIAGNOSIS — M961 Postlaminectomy syndrome, not elsewhere classified: Secondary | ICD-10-CM | POA: Diagnosis not present

## 2024-08-30 DIAGNOSIS — I69392 Facial weakness following cerebral infarction: Secondary | ICD-10-CM

## 2024-08-30 DIAGNOSIS — Z720 Tobacco use: Secondary | ICD-10-CM | POA: Diagnosis present

## 2024-08-30 DIAGNOSIS — N183 Chronic kidney disease, stage 3 unspecified: Secondary | ICD-10-CM | POA: Diagnosis present

## 2024-08-30 MED ORDER — LORATADINE 10 MG PO TABS: 10.0000 mg | ORAL_TABLET | Freq: Every day | ORAL | Status: DC | PRN

## 2024-08-30 MED ORDER — FLEET ENEMA RE ENEM: 1.0000 | ENEMA | Freq: Once | RECTAL | Status: DC | PRN

## 2024-08-30 MED ORDER — PROCHLORPERAZINE 25 MG RE SUPP: 12.5000 mg | Freq: Four times a day (QID) | RECTAL | Status: DC | PRN

## 2024-08-30 MED ORDER — CLOTRIMAZOLE 10 MG MT TROC
10.0000 mg | Freq: Every day | OROMUCOSAL | Status: DC
  Administered 2024-08-30 – 2024-08-31 (×6): 10 mg via ORAL
  Filled 2024-08-30 (×8): qty 1

## 2024-08-30 MED ORDER — TRAZODONE HCL 50 MG PO TABS: 25.0000 mg | ORAL_TABLET | Freq: Every evening | ORAL | Status: DC | PRN

## 2024-08-30 MED ORDER — METHOCARBAMOL 750 MG PO TABS
750.0000 mg | ORAL_TABLET | Freq: Two times a day (BID) | ORAL | Status: DC | PRN
  Administered 2024-08-30 – 2024-08-31 (×2): 750 mg via ORAL
  Filled 2024-08-30 (×2): qty 1

## 2024-08-30 MED ORDER — ATORVASTATIN CALCIUM 80 MG PO TABS
80.0000 mg | ORAL_TABLET | Freq: Every day | ORAL | Status: DC
  Administered 2024-08-31: 80 mg via ORAL
  Filled 2024-08-30: qty 1

## 2024-08-30 MED ORDER — PANTOPRAZOLE SODIUM 40 MG PO TBEC
40.0000 mg | DELAYED_RELEASE_TABLET | Freq: Every day | ORAL | Status: DC
  Administered 2024-08-31: 40 mg via ORAL
  Filled 2024-08-30: qty 1

## 2024-08-30 MED ORDER — GUAIFENESIN-DM 100-10 MG/5ML PO SYRP: 5.0000 mL | ORAL_SOLUTION | Freq: Four times a day (QID) | ORAL | Status: DC | PRN

## 2024-08-30 MED ORDER — PROCHLORPERAZINE EDISYLATE 10 MG/2ML IJ SOLN: 5.0000 mg | Freq: Four times a day (QID) | INTRAMUSCULAR | Status: DC | PRN

## 2024-08-30 MED ORDER — HYDRALAZINE HCL 50 MG PO TABS
50.0000 mg | ORAL_TABLET | Freq: Four times a day (QID) | ORAL | Status: DC
  Administered 2024-08-30: 50 mg via ORAL
  Filled 2024-08-30: qty 1

## 2024-08-30 MED ORDER — HYDRALAZINE HCL 50 MG PO TABS
50.0000 mg | ORAL_TABLET | Freq: Four times a day (QID) | ORAL | Status: DC
  Administered 2024-08-30 – 2024-08-31 (×5): 50 mg via ORAL
  Filled 2024-08-30 (×5): qty 1

## 2024-08-30 MED ORDER — HYDRALAZINE HCL 50 MG PO TABS: 50.0000 mg | ORAL_TABLET | Freq: Four times a day (QID) | ORAL | Status: AC

## 2024-08-30 MED ORDER — ENSURE PLUS HIGH PROTEIN PO LIQD: 237.0000 mL | Freq: Two times a day (BID) | ORAL | Status: DC

## 2024-08-30 MED ORDER — AMLODIPINE BESYLATE 10 MG PO TABS
10.0000 mg | ORAL_TABLET | Freq: Every day | ORAL | Status: DC
  Administered 2024-08-31: 10 mg via ORAL
  Filled 2024-08-30: qty 1

## 2024-08-30 MED ORDER — ACETAMINOPHEN 325 MG PO TABS
325.0000 mg | ORAL_TABLET | ORAL | Status: DC | PRN
  Administered 2024-08-30 – 2024-08-31 (×3): 650 mg via ORAL
  Filled 2024-08-30 (×3): qty 2

## 2024-08-30 MED ORDER — DIPHENHYDRAMINE HCL 25 MG PO CAPS: 25.0000 mg | ORAL_CAPSULE | Freq: Four times a day (QID) | ORAL | Status: DC | PRN

## 2024-08-30 MED ORDER — ACETAMINOPHEN 325 MG PO TABS: 650.0000 mg | ORAL_TABLET | ORAL | Status: DC | PRN

## 2024-08-30 MED ORDER — ASPIRIN 81 MG PO TBEC: 81.0000 mg | DELAYED_RELEASE_TABLET | Freq: Every day | ORAL | 0 refills | Status: AC

## 2024-08-30 MED ORDER — ATORVASTATIN CALCIUM 80 MG PO TABS: 80.0000 mg | ORAL_TABLET | Freq: Every day | ORAL | Status: AC

## 2024-08-30 MED ORDER — CLOPIDOGREL BISULFATE 75 MG PO TABS
75.0000 mg | ORAL_TABLET | Freq: Every day | ORAL | Status: DC
  Administered 2024-08-31: 75 mg via ORAL
  Filled 2024-08-30: qty 1

## 2024-08-30 MED ORDER — ENSURE PLUS HIGH PROTEIN PO LIQD
237.0000 mL | Freq: Two times a day (BID) | ORAL | Status: DC
  Administered 2024-08-31 (×2): 237 mL via ORAL

## 2024-08-30 MED ORDER — PANTOPRAZOLE SODIUM 40 MG PO TBEC: 40.0000 mg | DELAYED_RELEASE_TABLET | Freq: Every day | ORAL | Status: AC

## 2024-08-30 MED ORDER — MUSCLE RUB 10-15 % EX CREA: TOPICAL_CREAM | Freq: Three times a day (TID) | CUTANEOUS | Status: DC | PRN

## 2024-08-30 MED ORDER — ALLOPURINOL 100 MG PO TABS
100.0000 mg | ORAL_TABLET | Freq: Every day | ORAL | Status: DC
  Administered 2024-08-31: 100 mg via ORAL
  Filled 2024-08-30: qty 1

## 2024-08-30 MED ORDER — FLUTICASONE FUROATE-VILANTEROL 100-25 MCG/ACT IN AEPB
1.0000 | INHALATION_SPRAY | Freq: Every day | RESPIRATORY_TRACT | Status: DC
  Administered 2024-08-31: 1 via RESPIRATORY_TRACT
  Filled 2024-08-30: qty 28

## 2024-08-30 MED ORDER — POLYETHYLENE GLYCOL 3350 17 G PO PACK: 17.0000 g | PACK | Freq: Every day | ORAL | Status: DC | PRN

## 2024-08-30 MED ORDER — ENOXAPARIN SODIUM 40 MG/0.4ML IJ SOSY
40.0000 mg | PREFILLED_SYRINGE | INTRAMUSCULAR | Status: DC
Start: 2024-08-30 — End: 2024-09-01
  Administered 2024-08-30 – 2024-08-31 (×2): 40 mg via SUBCUTANEOUS
  Filled 2024-08-30 (×2): qty 0.4

## 2024-08-30 MED ORDER — ALUM & MAG HYDROXIDE-SIMETH 200-200-20 MG/5ML PO SUSP: 30.0000 mL | ORAL | Status: DC | PRN

## 2024-08-30 MED ORDER — ASPIRIN 81 MG PO TBEC
81.0000 mg | DELAYED_RELEASE_TABLET | Freq: Every day | ORAL | Status: DC
Start: 2024-08-31 — End: 2024-09-15
  Administered 2024-08-31: 81 mg via ORAL
  Filled 2024-08-30: qty 1

## 2024-08-30 MED ORDER — BISACODYL 10 MG RE SUPP: 10.0000 mg | Freq: Every day | RECTAL | Status: DC | PRN

## 2024-08-30 MED ORDER — HYDROXYZINE HCL 10 MG PO TABS: 10.0000 mg | ORAL_TABLET | Freq: Four times a day (QID) | ORAL | Status: DC | PRN

## 2024-08-30 MED ORDER — ORAL CARE MOUTH RINSE: 15.0000 mL | OROMUCOSAL | Status: DC | PRN

## 2024-08-30 MED ORDER — FLUTICASONE FUROATE-VILANTEROL 100-25 MCG/ACT IN AEPB: 1.0000 | INHALATION_SPRAY | Freq: Every day | RESPIRATORY_TRACT | Status: AC

## 2024-08-30 MED ORDER — PROCHLORPERAZINE MALEATE 5 MG PO TABS: 5.0000 mg | ORAL_TABLET | Freq: Four times a day (QID) | ORAL | Status: DC | PRN

## 2024-08-30 NOTE — TOC Initial Note (Signed)
 Transition of Care [facility]) - Initial/Assessment Note    Patient Details  Name: Deborah Mcmahon MRN: [id number] Date of Birth: May 15, 1961  Transition of Care [facility]) CM/SW Contact:    Inocente GORMAN Kindle, LCSW Phone Number: 08/30/2024, 9:56 AM  Clinical Narrative:                 Patient admitted from home with stroke. CSW continuing to follow for CIR determination of candidacy.   Expected Discharge Plan: IP Rehab Facility Barriers to Discharge: Continued Medical Work up, English as a second language teacher   Patient Goals and CMS Choice Patient states their goals for this hospitalization and ongoing recovery are:: Rehab CMS Medicare.gov Compare Post Acute Care list provided to:: Patient Choice offered to / list presented to : Patient [name] ownership interest in [facility].provided to:: Patient    Expected Discharge Plan and Services In-house Referral: Clinical Social Work   Post Acute Care Choice: IP Rehab Living arrangements for the past 2 months: Single Family Home                                      Prior Living Arrangements/Services Living arrangements for the past 2 months: Single Family Home   Patient language and need for interpreter reviewed:: Yes Do you feel safe going back to the place where you live?: Yes      Need for Family Participation in Patient Care: Yes (Comment) Care giver support system in place?: Yes (comment)   Criminal Activity/Legal Involvement Pertinent to Current Situation/Hospitalization: No - Comment as needed  Activities of Daily Living   ADL Screening (condition at time of admission) Independently performs ADLs?: Yes (appropriate for developmental age) Is the patient deaf or have difficulty hearing?: No Does the patient have difficulty seeing, even when wearing glasses/contacts?: Yes Does the patient have difficulty concentrating, remembering, or making decisions?: No  Permission Sought/Granted Permission sought to share information  with : Facility Medical sales representative, Family Supports Permission granted to share information with : Yes, Verbal Permission Granted  Share Information with NAME: Delphia Mood -Niece   [phone]           Emotional Assessment Appearance:: Appears stated age Attitude/Demeanor/Rapport: Unable to Assess Affect (typically observed): Unable to Assess Orientation: : Oriented to Self, Oriented to Place, Oriented to Situation Alcohol  / Substance Use: Illicit Drugs Psych Involvement: No (comment)  Admission diagnosis:  Acute ischemic stroke [facility]) [I63.9] Acute CVA (cerebrovascular accident) [facility]) [I63.9] Patient Active Problem List   Diagnosis Date Noted   Acute CVA (cerebrovascular accident) (HCC) 08/25/2024   Hypertensive crisis 01/20/2024   Nausea and vomiting 01/20/2024   Allergic rhinitis 04/19/2022   DOE (dyspnea on exertion) 02/21/2022   Acute blood loss anemia 12/28/2020   Melena 12/28/2020   Substance use 11/16/2020   CVA (cerebral vascular accident) (HCC) 11/15/2020   Tobacco abuse 11/15/2020   CKD (chronic kidney disease), stage III (HCC) 11/15/2020   Essential hypertension 12/10/2018   PCP:  Leontine Cramp, NP Pharmacy:   [facility] - [place], [place] - [address] [place] [address] Phone: [phone] Fax: [phone]     Social Drivers of Health (SDOH) Social History: SDOH Screenings   Food Insecurity: Food Insecurity Present (08/26/2024)  Housing: High Risk (08/26/2024)  Transportation Needs: No Transportation Needs (08/26/2024)  Utilities: At Risk (08/26/2024)  Financial Resource Strain: Not on File (05/17/2022)   Received from  [facility]  Physical Activity: Not on File (05/17/2022)   Received from [facility]  Social Connections: Not on File (08/09/2023)   Received from [facility]  Stress: Not on File (05/17/2022)   Received from [facility]  Tobacco Use: High Risk (08/25/2024)   SDOH Interventions: Food Insecurity Interventions:  [employer] Provided Housing Interventions: [employer] Provided Utilities Interventions: [employer] Provided   Readmission Risk Interventions     No data to display

## 2024-08-30 NOTE — Progress Notes (Signed)
 Inpatient Rehabilitation Admission Medication Review by a Pharmacist   A complete drug regimen review was completed for this patient to identify any potential clinically significant medication issues.   High Risk Drug Classes Is patient taking? Indication by Medication  Antipsychotic Yes Compazine prn N/V  Anticoagulant Yes Lovenox  - VTE ppx  Antibiotic No    Opioid No    Antiplatelet Yes Aspirin , clopidogrel  x 3 weeks then clopidogrel  alone - CVA  Hypoglycemics/insulin No    Vasoactive Medication Yes Amlodipine , hydralazine - HTN  Chemotherapy No    Other Yes Trazodone prn sleep Allopurinol  - gout Atorvastatin  - HLD Breo - COPD Pantoprazole  - reflux Clotrimazole- thrush  Claritin PRN- seasonal allergies Hydroxyzine- anxiety   Robaxin PRN- muscle spasms  Diphenhydramine  PRN- itching Bisacodyl , PEG, fleet enema- constipation         Type of Medication Issue Identified Description of Issue Recommendation(s)  Drug Interaction(s) (clinically significant)        Duplicate Therapy        Allergy        No Medication Administration End Date        Incorrect Dose        Additional Drug Therapy Needed        Significant med changes from prior encounter (inform family/care partners about these prior to discharge).      Other            Clinically significant medication issues were identified that warrant physician communication and completion of prescribed/recommended actions by midnight of the next day:  No   Name of provider notified for urgent issues identified:    Provider Method of Notification:        Pharmacist comments: None   Time spent performing this drug regimen review (minutes):  20 minutes   Massie Fila, PharmD Clinical Pharmacist  08/30/2024 3:44 PM

## 2024-08-30 NOTE — Discharge Summary (Signed)
 Physician Discharge Summary  Deborah Mcmahon FMW:[id number] DOB: January 04, 1961 DOA: 08/25/2024  PCP: Leontine Cramp, NP  Admit date: 08/25/2024 Discharge date: 08/30/2024  Admitted From:(Home) Disposition:  (CIR)  Recommendations for Outpatient Follow-up:  Please obtain BMP/CBC in one week Adjust antihypertensive medicine as needed, current goal is normotension   Diet recommendation: Dysphagia 3 with thin liquids  Brief/Interim Summary:  Deborah Mcmahon is a 63 y.o. female with medical history significant of essential hypertension, hyperlipidemia, previous CVA involving the cerebellar region, migraine headaches, carpal tunnel syndrome, who presented to the ER with slurred speech, right-sided weakness and facial droop.  Her workup was significant for acute CVA, as well she had urine drug screen positive for cocaine.  Acute Stroke MRI with acute infarcts in L basal ganglia (chronic in L cerebellum, bilateral basal ganglia, chronic microvascular ischemic changes) -CT head and neck mid/distal basilar artery short segment occlusion/near occlusion, reconstituted at the level of SCAs.  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  Bilateral ICA mixed atherosclerosis with 50% stenosis on the left ICA. -MRI left BG/CR infarct, old bilateral BG infarct and left cerebellar infarct -2D Echo EF more than 75% -LDL 118 -HgbA1c 5.1 -UDS positive for cocaine -clopidogrel  75 mg daily (noncompliant) prior to admission, now on aspirin  81 mg daily and clopidogrel  75 mg daily DAPT for 3 weeks and then Plavix  alone per neurology -PT/OT/SLP, commendations for CIR   Dysphagia - This post BMS, started on diet, currently on dysphagia 3 with thin liquid.   Tobacco abuse - Counseled    Acute Metabolic Encephalopathy -Due to above, much improved.   Hypertension  -Recommendation for permissive hypertension for at least 2 days from 10/2 given severe stenosis -Resume meds gradually and avoid  hypotension -Long-term goal is normotension - Blood pressure controlled on amlodipine  10 mg oral daily, and hydralazine 75 mg every 6 hours, actually is on the lower side today so I will decrease hydralazine to gram oral every 6 hours, and this will likely need frequent titration for optimal blood pressure control . - For now holding on starting beta-blockers given she presents with cocaine use  - As well anxiety likely was contributing to her hypertension, she was on Xanax, but this caused her to be sleepy most of the time so Xanax was discontinued.   CKD IIIb Baseline creatinine appears between 1.2-1.6 Around baseline, trend    Thrush Clotrimazole troche, resolved   COPD  Breo ellipta   GERD  PPI   Gout Continue allopurinol  for now   Cocaine abuse -counseled    Discharge Diagnoses:  Principal Problem:   Acute CVA (cerebrovascular accident) [facility]) Active Problems:   Essential hypertension   Tobacco abuse   CKD (chronic kidney disease), stage III (HCC)   Substance use    Discharge Instructions  Discharge Instructions     Diet - low sodium heart healthy   Complete by: As directed    Discharge instructions   Complete by: As directed    Management per CIR   Increase activity slowly   Complete by: As directed       Allergies as of 08/30/2024   No Known Allergies      Medication List     STOP taking these medications    carvedilol  6.25 MG tablet Commonly known as: COREG    losartan  100 MG tablet Commonly known as: COZAAR    methocarbamol 750 MG tablet Commonly known as: ROBAXIN       TAKE these medications  acetaminophen  325 MG tablet Commonly known as: TYLENOL  Take 2 tablets (650 mg total) by mouth every 4 (four) hours as needed for mild pain (pain score 1-3) or fever (or temp > 37.5 C (99.5 F)). What changed:  medication strength how much to take when to take this reasons to take this   albuterol  108 (90 Base) MCG/ACT inhaler Commonly  known as: VENTOLIN  HFA Inhale 2 puffs into the lungs every 6 (six) hours as needed for wheezing or shortness of breath.   allopurinol  100 MG tablet Commonly known as: ZYLOPRIM  Take 100 mg by mouth every other day.   amLODipine  10 MG tablet Commonly known as: NORVASC  Take 1 tablet (10 mg total) by mouth daily. What changed: when to take this   aspirin  EC 81 MG tablet Take 1 tablet (81 mg total) by mouth daily for 21 days. Swallow whole.   atorvastatin  80 MG tablet Commonly known as: LIPITOR Take 1 tablet (80 mg total) by mouth daily. Start taking on: August 31, 2024 What changed:  medication strength how much to take   clopidogrel  75 MG tablet Commonly known as: PLAVIX  Take 75 mg by mouth every other day.   feeding supplement Liqd Take 237 mLs by mouth 2 (two) times daily between meals.   fluticasone  furoate-vilanterol 100-25 MCG/ACT Aepb Commonly known as: Breo Ellipta  Inhale 1 puff into the lungs daily.   hydrALAZINE 50 MG tablet Commonly known as: APRESOLINE Take 1 tablet (50 mg total) by mouth every 6 (six) hours.   pantoprazole  40 MG tablet Commonly known as: PROTONIX  Take 1 tablet (40 mg total) by mouth daily.        Follow-up Information     [facility] Social services. Call.   Why: [phone]  For utility hep and other services               No Known Allergies  Consultations: Neurology    Subjective: No significant events overnight, she had a good night sleep, denies any complaints today, eager to go to rehab  Discharge Exam: Vitals:   08/30/24 0800 08/30/24 0824  BP: 132/62   Pulse: 93   Resp:    Temp: (!) 97.5 F (36.4 C)   SpO2:  94%   Vitals:   08/30/24 0400 08/30/24 0733 08/30/24 0800 08/30/24 0824  BP: 129/88 135/66 132/62   Pulse:  94 93   Resp: 16 11    Temp: 97.7 F (36.5 C)  (!) 97.5 F (36.4 C)   TempSrc: Oral  Oral   SpO2:  96%  94%    General: Pt is alert, awake, with significant dysarthria and  right-sided weakness Cardiovascular: RRR, S1/S2 +, no rubs, no gallops Respiratory: CTA bilaterally, no wheezing, no rhonchi Abdominal: Soft, NT, ND, bowel sounds + Extremities: no edema, no cyanosis    The results of significant diagnostics from this hospitalization (including imaging, microbiology, ancillary and laboratory) are listed below for reference.     Microbiology: No results found for this or any previous visit (from the past 240 hours).   Labs: BNP (last 3 results) No results for input(s): BNP in the last 8760 hours. Basic Metabolic Panel: Recent Labs  Lab 08/25/24 1628 08/25/24 1640 08/26/24 0022 08/27/24 0228  NA 139 141  --  138  K 3.8 3.8  --  4.0  CL 106 106  --  109  CO2 23  --   --  17*  GLUCOSE 114* 115*  --  94  BUN  26* 28*  --  19  CREATININE 1.64* 1.80* 1.37* 1.05*  CALCIUM  9.0  --   --  9.1  MG  --   --   --  2.1  PHOS  --   --   --  3.7   Liver Function Tests: Recent Labs  Lab 08/25/24 1628 08/27/24 0228  AST 18 23  ALT 20 19  ALKPHOS 57 49  BILITOT 0.3 1.0  PROT 6.9 7.1  ALBUMIN 3.3* 3.2*   No results for input(s): LIPASE, AMYLASE in the last 168 hours. Recent Labs  Lab 08/26/24 1319  AMMONIA 27   CBC: Recent Labs  Lab 08/25/24 1628 08/25/24 1640 08/26/24 0022 08/27/24 0228  WBC 5.5  --  6.9 6.7  NEUTROABS 2.7  --   --  4.6  HGB 13.6 14.3 12.9 13.9  HCT 42.3 42.0 40.2 43.2  MCV 89.8  --  88.9 87.1  PLT 221  --  217 236   Cardiac Enzymes: No results for input(s): CKTOTAL, CKMB, CKMBINDEX, TROPONINI in the last 168 hours. BNP: Invalid input(s): POCBNP CBG: Recent Labs  Lab 08/25/24 1631  GLUCAP 194*   D-Dimer No results for input(s): DDIMER in the last 72 hours. Hgb A1c No results for input(s): HGBA1C in the last 72 hours. Lipid Profile No results for input(s): CHOL, HDL, LDLCALC, TRIG, CHOLHDL, LDLDIRECT in the last 72 hours. Thyroid  function studies No results for input(s):  TSH, T4TOTAL, T3FREE, THYROIDAB in the last 72 hours.  Invalid input(s): FREET3 Anemia work up No results for input(s): VITAMINB12, FOLATE, FERRITIN, TIBC, IRON, RETICCTPCT in the last 72 hours. Urinalysis    Component Value Date/Time   COLORURINE YELLOW 08/25/2024 1740   APPEARANCEUR HAZY (A) 08/25/2024 1740   LABSPEC 1.017 08/25/2024 1740   PHURINE 5.0 08/25/2024 1740   GLUCOSEU NEGATIVE 08/25/2024 1740   HGBUR SMALL (A) 08/25/2024 1740   BILIRUBINUR NEGATIVE 08/25/2024 1740   KETONESUR NEGATIVE 08/25/2024 1740   PROTEINUR 30 (A) 08/25/2024 1740   UROBILINOGEN 0.2 11/13/2020 1348   NITRITE NEGATIVE 08/25/2024 1740   LEUKOCYTESUR NEGATIVE 08/25/2024 1740   Sepsis Labs Recent Labs  Lab 08/25/24 1628 08/26/24 0022 08/27/24 0228  WBC 5.5 6.9 6.7   Microbiology No results found for this or any previous visit (from the past 240 hours).   Time coordinating discharge: Over 30 minutes  SIGNED:   Brayton Lye, MD  [facility] 08/30/2024, 10:26 AM Pager   If 7PM-7AM, please contact night-coverage [email or web address]

## 2024-08-30 NOTE — H&P (Shared)
 Physical Medicine and Rehabilitation Admission H&P    Chief Complaint  Patient presents with   Functional deficits due to stroke.    HPI:  Deborah Mcmahon is a 63 year old female with history of HTN, chronic LBP, migraines, cerebellar CVA, 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, who was admitted on 08/25/24 with slurred speech, facial droop and right sided weakness.  Patient reported waking up with symptoms early arm, BP 226/122 at admission and was outside window for tPA. CTA head showed stable cerebellar stroke. MRI brain showed left basal ganglia infarct with periventicular white matter ischemic infarction. Neurology questioned stroke secondary to chronic hypertensive angiopathy v/s cardioembolic v/s atheroembolic cause but  UDS + cocaine and also question of vasospasm. 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   She received IVF and and BP meds held to allow for adequate perfusion. Dr. Jerri felt that stroke likely due to small vessel disease and recommended DAPT X 3 weeks followed by Plavix  alone (as noncompliant with plavix  PTA) Long term BP goal 130-`50 given severe bilateral artery stenosis.  PT/OT consulted and working on pregait activity due to severe right sided weakness and increased tone noted on right. She requires +2 mod assist with equipment for tranfers and mod to max assist with ADLs. Also noted to have difficulty following multi-step commands. She has been disabled due to multiple neck and back surgeries. CIR recommended due to functional decline.     ROS   Past Medical History:  Diagnosis Date   Carpal tunnel syndrome    bilateral   Chronic back pain    spondylolisthesis   Constipation    takes an OTC stool softener daily as needed   Gallstones    History  of migraine    Hypertension    Nausea and vomiting 01/20/2024   Nocturia    Weakness    tingling     Past Surgical History:  Procedure Laterality Date   BACK SURGERY     fusion   BIOPSY  12/31/2020   Procedure: BIOPSY;  Surgeon: Wilhelmenia Aloha Raddle., MD;  Location: [facility] ENDOSCOPY;  Service: Gastroenterology;;   CERVICAL SPINE SURGERY     x 4-fusion   COLONOSCOPY WITH PROPOFOL  N/A 12/31/2020   Procedure: COLONOSCOPY WITH PROPOFOL ;  Surgeon: Wilhelmenia Aloha Raddle., MD;  Location: [facility] ENDOSCOPY;  Service: Gastroenterology;  Laterality: N/A;   ESOPHAGOGASTRODUODENOSCOPY (EGD) WITH PROPOFOL  N/A 12/31/2020   Procedure: ESOPHAGOGASTRODUODENOSCOPY (EGD) WITH PROPOFOL ;  Surgeon: Wilhelmenia Aloha Raddle., MD;  Location[facility] ENDOSCOPY;  Service: Gastroenterology;  Laterality: N/A;   MYRINGOTOMY     POLYPECTOMY  12/31/2020   Procedure: POLYPECTOMY;  Surgeon: Mansouraty, Aloha Raddle., MD;  Location: [facility] ENDOSCOPY;  Service: Gastroenterology;;   right knee surgery     screws   TONSILLECTOMY AND ADENOIDECTOMY     TUBAL LIGATION      Family History  Problem Relation Age of Onset   Anesthesia problems Neg Hx     Social History:  Lives with fiance and was working PTA. Per  reports that she has been smoking cigarettes for 40+ years. She has a 20.5 pack-year smoking history. She has never used smokeless tobacco. She reports that she does not drink alcohol . She smoke marijuana occasionally and does cocaine 1-2 X week.    Allergies: No Known Allergies   Medications  Prior to Admission  Medication Sig Dispense Refill   acetaminophen  (TYLENOL ) 500 MG tablet Take 2 tablets (1,000 mg total) by mouth every 6 (six) hours as needed. 30 tablet 0   albuterol  (VENTOLIN  HFA) 108 (90 Base) MCG/ACT inhaler Inhale 2 puffs into the lungs every 6 (six) hours as needed for wheezing or shortness of breath.     allopurinol  (ZYLOPRIM ) 100 MG tablet Take 100 mg by mouth every other day.     amLODipine  (NORVASC ) 10 MG tablet  Take 1 tablet (10 mg total) by mouth daily. (Patient taking differently: Take 10 mg by mouth every other day.) 30 tablet 3   atorvastatin  (LIPITOR) 40 MG tablet Take 1 tablet (40 mg total) by mouth daily. (Patient taking differently: Take 40 mg by mouth every other day.) 90 tablet 3   carvedilol  (COREG ) 6.25 MG tablet Take 1 tablet (6.25 mg total) by mouth 2 (two) times daily. Please call [phone] to schedule an appointment for future refills. Thank you. 1st attempt. (Patient taking differently: Take 6.25 mg by mouth every other day. Please call [phone] to schedule an appointment for future refills. Thank you. 1st attempt.) 60 tablet 0   clopidogrel  (PLAVIX ) 75 MG tablet Take 75 mg by mouth every other day.     losartan  (COZAAR ) 100 MG tablet Take 100 mg by mouth every other day.     methocarbamol (ROBAXIN) 750 MG tablet Take 750 mg by mouth 2 (two) times daily as needed for muscle spasms.     [DISCONTINUED] fluticasone  furoate-vilanterol (BREO ELLIPTA ) 100-25 MCG/ACT AEPB Inhale 1 puff into the lungs daily. (Patient not taking: Reported on 08/27/2024) 60 each 0   [DISCONTINUED] pantoprazole  (PROTONIX ) 40 MG tablet Take 40 mg by mouth daily. (Patient not taking: Reported on 08/27/2024)       Home: Home Living Family/patient expects to be discharged to:: Private residence Living Arrangements: Spouse/significant other Available Help at Discharge: Family, Friend(s), Available 24 hours/day Type of Home: House Home Access: Stairs to enter [employer] of Steps: 2 Entrance Stairs-Rails: Right, Left, Can reach both Home Layout: One level Bathroom Shower/Tub: Tub/shower unit, Curtain, Sport and exercise psychologist: Standard Bathroom Accessibility: Yes Home Equipment: Cane - single point, Shower seat Additional Comments: Pt lives with fiance who works full time  Lives With: Significant other   Functional History: Prior Function Prior Level of Function : Independent/Modified Independent,  Driving Mobility Comments: has a cane but never uses it ADLs Comments: fully independent and driving  Functional Status:  Mobility: Bed Mobility Overal bed mobility: Needs Assistance Bed Mobility: Supine to Sit Supine to sit: Mod assist, HOB elevated Sit to supine: Mod assist, HOB elevated, Used rails General bed mobility comments: Pt in chair on arrival Transfers Overall transfer level: Needs assistance Equipment used: 1 person hand held assist, Rolling walker (2 wheels) Transfers: Sit to/from Stand, Bed to chair/wheelchair/BSC Sit to Stand: Mod assist, +2 safety/equipment, +2 physical assistance Bed to/from chair/wheelchair/BSC transfer type:: Step pivot Stand pivot transfers: Mod assist Step pivot transfers: Max assist General transfer comment: Mod A +2 to rise from recliner. Pt needed assist to place right UE on RW for support only with pt unable to grip.  Needed min assist to maintain static stance. Pt practiced stepping right LE forward and backward with cues and mod assist for safety with 2nd person present for safety as well.  Pt practiced weight shifting as well.  Pt could not step left LE even with mod cues and mod assist as right LE weak  and buckles if all weight is on it. Ambulation/Gait General Gait Details: deferred as pt too weak to take steps yet    ADL: ADL Overall ADL's : Needs assistance/impaired Eating/Feeding: Minimal assistance, Sitting Eating/Feeding Details (indicate cue type and reason): pt feeding self with L hand only. Pt requires cues to slow down and clear food out of R side of mouth as pt tends to pocket food. Pt needs min assist and min cues to slow down. Grooming: Wash/dry hands, Wash/dry face, Oral care, Set up, Sitting Grooming Details (indicate cue type and reason): using RUE as a gross assist Upper Body Bathing: Moderate assistance, Sitting Lower Body Bathing: Maximal assistance, Sit to/from stand, Cueing for compensatory techniques Upper Body  Dressing : Maximal assistance, Sitting Lower Body Dressing: Maximal assistance, Sit to/from stand, Cueing for compensatory techniques Lower Body Dressing Details (indicate cue type and reason): Pt with heavy R lean when dressing at EOB. Instructed pt on using RUE to weight bear in sitting instead of it dropping of front of bed.  Pt stood with min assist but unable to manage clothing in standing. Toilet Transfer: Moderate assistance, Stand-pivot, BSC/3in1 Toilet Transfer Details (indicate cue type and reason): to strong side Toileting- Clothing Manipulation and Hygiene: Maximal assistance, Sit to/from stand Functional mobility during ADLs: Moderate assistance General ADL Comments: Tone slightly improved in R side today. Pt very motivated to do for herself. Has increased independence doing adls in the chair when balance is supported.  Cognition: Cognition Overall Cognitive Status: Impaired/Different from baseline Arousal/Alertness: Awake/alert Orientation Level: Oriented to person, Oriented to place, Oriented to situation, Disoriented to time Attention: Sustained Sustained Attention: Impaired Sustained Attention Impairment: Verbal basic, Functional basic Memory: Appears intact Awareness: Impaired Awareness Impairment: Intellectual impairment Problem Solving: Impaired Problem Solving Impairment: Verbal basic, Functional basic Cognition Arousal: Alert Behavior During Therapy: WFL for tasks assessed/performed Overall Cognitive Status: Impaired/Different from baseline    Blood pressure 129/76, pulse 96, temperature 97.7 F (36.5 C), temperature source Oral, resp. rate 17, SpO2 94%. Physical Exam Vitals and nursing note reviewed.  HENT:     Head: Normocephalic and atraumatic.  Pulmonary:     Breath sounds: Wheezing present.  Neurological:     Mental Status: She is oriented to person, place, and time.     No results found for this or any previous visit (from the past 48 hours). No  results found.    Blood pressure 129/76, pulse 96, temperature 97.7 F (36.5 C), temperature source Oral, resp. rate 17, SpO2 94%.  Medical Problem List and Plan: 1. Functional deficits secondary to ***  -patient may *** shower  -ELOS/Goals: *** 2.  Antithrombotics: -DVT/anticoagulation:  Pharmaceutical: Lovenox   -antiplatelet therapy: DAPT X 3 weeks followed by Plavix  alone.  3. Pain Management: Tylenol  prn. Used Robaxin bid prn for issues with back pain/spasms. 4. Mood/Behavior/Sleep: LCSW to follow for evaluation and support.   -antipsychotic agents: N/A 5. Neuropsych/cognition: This patient is capable of making decisions on her own behalf. 6. Skin/Wound Care: Routine pressure relief measures 7. Fluids/Electrolytes/Nutrition: Monitor with serial checks.  8.  HTN: Monitor BP TID. Long term BP goal 130-150 range.   --amlodipine  10 mg/day and Hydralazine 50 mg qid.  9. Hyperlipidemia: LDL 118 and now on Lipitor 80 mg daily 10. H/o COPD: Has had cardiac/pulmonary work up for SOB/DOE.  --Has smoked for 40+ years. On Breo.  11. H/o cocaine use: Continue to educate on importance of cessation.  12. CKD III A: BUN/SCr 26/1.64 @ admission and 1.3-1.5  in the past year per chart review.  --Will monitor serially.  13. Gout: On allopurinol .  14. H/o GIB/PUD:  Monitor H/H due to DAPT on board. On PPI.  15. Thrush: Treated with mycelex torches.  16. Prominent adenoids: Follow up with ENT for evaluation after discharge.     ***  Sharlet GORMAN Schmitz, PA-C 08/30/2024

## 2024-08-30 NOTE — Progress Notes (Signed)
 Physical Therapy Treatment Patient Details Name: Deborah Mcmahon MRN: [id number] DOB: 19-Apr-1961 Today's Date: 08/30/2024   History of Present Illness Pt is a 63 yo female admitted for slurred speech, R side weakness and R facial droop. 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 PMH: substance abuse, cerevellar CVA, HTN, migraines, carpel tunnel, COPD, gout.    PT Comments  Pt admitted with above diagnosis. Pt with significant right hemibody weakness requiring mod assist of 2 for standing/pre-gait activities. Pt fatigues quickly and has decr safety awareness as well. Needed cues for breathing techniques as pt breathing rapidly when standing and pt stating she is anxious.  Pt needs post acute rehab > 3hours day.   Pt currently with functional limitations due to the deficits listed below (see PT Problem List). Pt will benefit from acute skilled PT to increase their independence and safety with mobility to allow discharge.       If plan is discharge home, recommend the following: A lot of help with walking and/or transfers;A lot of help with bathing/dressing/bathroom   Can travel by private vehicle        Equipment Recommendations  Wheelchair cushion (measurements PT);Wheelchair (measurements PT)    Recommendations for Other Services       Precautions / Restrictions Precautions Precautions: Fall;Other (comment) (increased tone in R side) Recall of Precautions/Restrictions: Intact Precaution/Restrictions Comments: R hemi Restrictions Weight Bearing Restrictions Per Provider Order: No     Mobility  Bed Mobility               General bed mobility comments: Pt in chair on arrival    Transfers Overall transfer level: Needs assistance Equipment used: 1 person hand held assist, Rolling walker (2 wheels) Transfers: Sit to/from Stand, Bed to chair/wheelchair/BSC Sit to Stand: Mod assist, +2 safety/equipment, +2 physical assistance           General transfer comment: Mod A +2 to  rise from recliner. Pt needed assist to place right UE on RW for support only with pt unable to grip.  Needed min assist to maintain static stance. Pt practiced stepping right LE forward and backward with cues and mod assist for safety with 2nd person present for safety as well.  Pt practiced weight shifting as well.  Pt could not step left LE even with mod cues and mod assist as right LE weak and buckles if all weight is on it.    Ambulation/Gait               General Gait Details: deferred as pt too weak to take steps yet   Stairs             Wheelchair Mobility     Tilt Bed    Modified Rankin (Stroke Patients Only) Modified Rankin (Stroke Patients Only) Pre-Morbid Rankin Score: No symptoms Modified Rankin: Severe disability     Balance           Standing balance support: Bilateral upper extremity supported, During functional activity Standing balance-Leahy Scale: Poor Standing balance comment: must have outside support. Unable to bear much weight through RLE                            Communication Communication Communication: Impaired Factors Affecting Communication: Reduced clarity of speech  Cognition Arousal: Alert Behavior During Therapy: [facility] for tasks assessed/performed  Following commands: Impaired Following commands impaired: Only follows one step commands consistently, Follows multi-step commands inconsistently    Cueing Cueing Techniques: Verbal cues, Tactile cues  Exercises General Exercises - Lower Extremity Quad Sets: AROM, Both, 10 reps, Supine Long Arc Quad: AAROM, Right, 10 reps, Seated, Both, AROM Hip Flexion/Marching: AROM, Both, 10 reps, Seated    General Comments General comments (skin integrity, edema, etc.): Pt had periods of incr rate of breathing and reports she is anxious when she stands and works with therapy. VSS.  Pt encouraged to pursed lip breathe as she would breathe  fast with little effort.      Pertinent Vitals/Pain Pain Assessment Pain Assessment: Faces Faces Pain Scale: Hurts little more Pain Location: R elbow with ROM Pain Descriptors / Indicators: Aching Pain Intervention(s): Limited activity within patient's tolerance, Monitored during session, Repositioned    Home Living                          Prior Function            PT Goals (current goals can now be found in the care plan section) Acute Rehab PT Goals Patient Stated Goal: to go home Progress towards PT goals: Progressing toward goals    Frequency    Min 3X/week      PT Plan      Co-evaluation              AM-PAC PT 6 Clicks Mobility   Outcome Measure  Help needed turning from your back to your side while in a flat bed without using bedrails?: A Lot Help needed moving from lying on your back to sitting on the side of a flat bed without using bedrails?: A Lot Help needed moving to and from a bed to a chair (including a wheelchair)?: Total Help needed standing up from a chair using your arms (e.g., wheelchair or bedside chair)?: Total Help needed to walk in hospital room?: Total Help needed climbing 3-5 steps with a railing? : Total 6 Click Score: 8    End of Session Equipment Utilized During Treatment: Gait belt Activity Tolerance: Patient limited by fatigue Patient left: in chair;with call bell/phone within reach;with chair alarm set Nurse Communication: Mobility status PT Visit Diagnosis: Other abnormalities of gait and mobility (R26.89);Hemiplegia and hemiparesis Hemiplegia - Right/Left: Right Hemiplegia - dominant/non-dominant: Dominant     Time: 8791-8768 PT Time Calculation (min) (ACUTE ONLY): 23 min  Charges:    $Gait Training: 8-22 mins $Therapeutic Exercise: 8-22 mins PT General Charges $$ ACUTE PT VISIT: 1 Visit                     Deborah Mcmahon M,PT Acute Rehab Services [phone]    Deborah Mcmahon 08/30/2024, 12:36  PM

## 2024-08-30 NOTE — Plan of Care (Signed)
  Problem: Education: Goal: Knowledge of disease or condition will improve Outcome: Progressing   Problem: Ischemic Stroke/TIA Tissue Perfusion: Goal: Complications of ischemic stroke/TIA will be minimized Outcome: Progressing   Problem: Nutrition: Goal: Adequate nutrition will be maintained Outcome: Progressing   Problem: Pain Managment: Goal: General experience of comfort will improve and/or be controlled Outcome: Progressing   Problem: Safety: Goal: Ability to remain free from injury will improve Outcome: Progressing

## 2024-08-30 NOTE — Progress Notes (Signed)
 Occupational Therapy Treatment Patient Details Name: Deborah Mcmahon MRN: [id number] DOB: [birth year] Today's Date: 08/30/2024   History of present illness Pt is a 63 yo female admitted for slurred speech, R side weakness and R facial droop. 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 PMH: substance abuse, cerevellar CVA, HTN, migraines, carpel tunnel, COPD, gout.   OT comments  Pt is a very motivated woman who is improving slowly with mobility and adls. Pt continues to require mod to max assist with most adls. Pt is transferring with miin assist to stand and mod to chair. SROM exercises and weight bearing tasks reviewed. If pt has assist at home, feel greater than 3 hours of therapy a day would benefit pt before returning home.      If plan is discharge home, recommend the following:  A lot of help with walking and/or transfers;A lot of help with bathing/dressing/bathroom;Assistance with cooking/housework;Direct supervision/assist for medications management;Direct supervision/assist for financial management;Assist for transportation;Help with stairs or ramp for entrance;Supervision due to cognitive status   Equipment Recommendations  Other (comment) (tbd)    Recommendations for Other Services      Precautions / Restrictions Precautions Precautions: Fall;Other (comment) (increased tone in R side) Recall of Precautions/Restrictions: Intact Precaution/Restrictions Comments: R hemi Restrictions Weight Bearing Restrictions Per Provider Order: No       Mobility Bed Mobility Overal bed mobility: Needs Assistance Bed Mobility: Supine to Sit     Supine to sit: Mod assist, HOB elevated     General bed mobility comments: assist to bring R LE off EOB and support R UE    Transfers Overall transfer level: Needs assistance Equipment used: 1 person hand held assist Transfers: Sit to/from Stand, Bed to chair/wheelchair/BSC Sit to Stand: Min assist Stand pivot transfers: Mod assist   Step  pivot transfers: Max assist     General transfer comment: MinA to rise and ModA to stand-pivot. Face to face transfer with use of gait belt     Balance Overall balance assessment: Needs assistance Sitting-balance support: Feet supported, Single extremity supported Sitting balance-Leahy Scale: Fair Sitting balance - Comments: static sitting pt is supervision. When attempting adls EOB pt reqiured mod assist at times due to R lean   Standing balance support: Bilateral upper extremity supported, During functional activity Standing balance-Leahy Scale: Poor Standing balance comment: must have outside support. Unable to bear much weight through RLE                           ADL either performed or assessed with clinical judgement   ADL Overall ADL's : Needs assistance/impaired Eating/Feeding: Minimal assistance;Sitting Eating/Feeding Details (indicate cue type and reason): pt feeding self with L hand only. Pt requires cues to slow down and clear food out of R side of mouth as pt tends to pocket food. Pt needs min assist and min cues to slow down. Grooming: Wash/dry hands;Wash/dry face;Oral care;Set up;Sitting Grooming Details (indicate cue type and reason): using RUE as a gross assist             Lower Body Dressing: Maximal assistance;Sit to/from stand;Cueing for compensatory techniques Lower Body Dressing Details (indicate cue type and reason): Pt with heavy R lean when dressing at EOB. Instructed pt on using RUE to weight bear in sitting instead of it dropping of front of bed.  Pt stood with min assist but unable to manage clothing in standing. Toilet Transfer: Moderate assistance;Stand-pivot;BSC/3in1 Statistician Details (  indicate cue type and reason): to strong side Toileting- Clothing Manipulation and Hygiene: Maximal assistance;Sit to/from stand       Functional mobility during ADLs: Moderate assistance General ADL Comments: Tone slightly improved in R side today.  Pt very motivated to do for herself. Has increased independence doing adls in the chair when balance is supported.    Extremity/Trunk Assessment Upper Extremity Assessment Upper Extremity Assessment: RUE deficits/detail RUE Deficits / Details: PROM WFL with a lot of tone. No active functional movement noted. RUE Coordination: decreased fine motor;decreased gross motor   Lower Extremity Assessment Lower Extremity Assessment: Defer to PT evaluation        Vision       Perception Perception Perception: Impaired Preception Impairment Details: Inattention/Neglect Perception-Other Comments: Pt forgets about her R side when standing and when distracted with other tasks.  When not distracted pt will reach for R arm and put it on pillow and in good position.   Praxis     Communication Communication Communication: Impaired Factors Affecting Communication: Reduced clarity of speech   Cognition Arousal: Alert Behavior During Therapy: WFL for tasks assessed/performed Cognition: Cognition impaired         Attention impairment (select first level of impairment): Selective attention Executive functioning impairment (select all impairments): Problem solving OT - Cognition Comments: Pt impulsive at times and is very aware of her weaknesses and motiviated but at times impulsive with movements                 Following commands: Impaired Following commands impaired: Only follows one step commands consistently, Follows multi-step commands inconsistently      Cueing   Cueing Techniques: Verbal cues, Tactile cues  Exercises Exercises: Other exercises Other Exercises Other Exercises: PROM to shoulder, elbow, wrist and hand in all planes.  Instructed pt on SROM with shoulder in proper position.    Shoulder Instructions       General Comments Pt very motivated to work asking theapist to stay longer to do UE exercises.    Pertinent Vitals/ Pain       Pain Assessment Pain  Assessment: Faces Faces Pain Scale: Hurts little more Pain Location: R elbow with ROM Pain Descriptors / Indicators: Aching Pain Intervention(s): Monitored during session, Repositioned  Home Living     Available Help at Discharge: Family;Friend(s);Available 24 hours/day                     Bathroom Accessibility: Yes How Accessible: Accessible via walker            Prior Functioning/Environment              Frequency  Min 2X/week        Progress Toward Goals  OT Goals(current goals can now be found in the care plan section)  Progress towards OT goals: Progressing toward goals  Acute Rehab OT Goals Patient Stated Goal: to get movement in my arm back OT Goal Formulation: With patient Time For Goal Achievement: 09/09/24 Potential to Achieve Goals: Good ADL Goals Pt Will Perform Eating: with set-up;sitting Pt Will Perform Grooming: with min assist;sitting Pt Will Perform Lower Body Bathing: with min assist;sit to/from stand Pt Will Perform Lower Body Dressing: with min assist;sit to/from stand Pt Will Transfer to Toilet: with contact guard assist;stand pivot transfer;bedside commode Pt Will Perform Toileting - Clothing Manipulation and hygiene: with contact guard assist;sit to/from stand Additional ADL Goal #1: Pt will tend to RUE due to inattention to maintain integrity  of that limb with min cues.  Plan      Co-evaluation                 AM-PAC OT 6 Clicks Daily Activity     Outcome Measure   Help from another person eating meals?: A Little Help from another person taking care of personal grooming?: A Little Help from another person toileting, which includes using toliet, bedpan, or urinal?: A Lot Help from another person bathing (including washing, rinsing, drying)?: A Lot Help from another person to put on and taking off regular upper body clothing?: A Lot Help from another person to put on and taking off regular lower body clothing?: A  Lot 6 Click Score: 14    End of Session Equipment Utilized During Treatment: Rolling walker (2 wheels)  OT Visit Diagnosis: Unsteadiness on feet (R26.81);Other abnormalities of gait and mobility (R26.89);Muscle weakness (generalized) (M62.81);Feeding difficulties (R63.3);Other symptoms and signs involving the nervous system (R29.898);Other symptoms and signs involving cognitive function;Hemiplegia and hemiparesis Hemiplegia - Right/Left: Right Hemiplegia - dominant/non-dominant: Dominant Hemiplegia - caused by: Cerebral infarction   Activity Tolerance Patient tolerated treatment well   Patient Left in chair;with call bell/phone within reach;with chair alarm set   Nurse Communication Mobility status;Other (comment)        Time: 8985-8955 OT Time Calculation (min): 30 min  Charges: OT General Charges $OT Visit: 1 Visit OT Treatments $Self Care/Home Management : 8-22 mins $Therapeutic Exercise: 8-22 mins   Joshua Silvano Dragon 08/30/2024, 10:59 AM

## 2024-08-30 NOTE — Progress Notes (Signed)
 Patient ID: Deborah Mcmahon, female   DOB: 1961/02/07, 63 y.o.   MRN: [id number] Met with the patient to review current medical situation, rehab process, team conference and plan of care. Discussed secondaryr isk management including smoking cessation, HTN, HLD and previous stroke on Plavix . Reviewed medications and dietary modification including DAPT x 3 weeks then Plavix  solo. Reviewed Gout management and low purine diet recommendations. Patient breathes heavily; reported she is excited for rehab and anxious to go home. Also reported right flank pain; PA made aware.  Continue to follow along to address educational needs to facilitate preparation for discharge. Deborah Mcmahon

## 2024-08-30 NOTE — Progress Notes (Signed)
 Signed     Expand All Collapse All PMR Admission Coordinator Pre-Admission Assessment   Patient: Deborah Mcmahon is an 63 y.o., female MRN: [id number] DOB: 11-Sep-1961 Height:   Weight:     Insurance Information HMO:     PPO:      PCP:      IPA:      80/20:      OTHER:  PRIMARY: Medicaid of [place]      Policy#: [id number] p      Subscriber: patient CM Name:       Phone#:      Fax#:  Pre-Cert#:       Employer:  Benefits:  Phone #:      Name:  Eff. Date:  08/28/24    Deduct:       Out of Pocket Max:       Life Max:  CIR:  100% coverage     SNF:  Outpatient:      Co-Pay:  Home Health:       Co-Pay:  DME:      Co-Pay:  Providers: in-network SECONDARY:       Policy#:      Phone#:    Artist:       Phone#:    The Data processing manager" for patients in Inpatient Rehabilitation Facilities with attached "Privacy Act Statement-Health Care Records" was provided and verbally reviewed with: Patient   Emergency Contact Information Contact Information       Name Relation Home Work Mobile    [place] Niece     [phone]    [facility] Significant other     [phone]         Other Contacts   None on File        Current Medical History  Patient Admitting Diagnosis: CVA History of Present Illness: Pt is a 63 year old female with medical hx significant for: HTN, CVA (10/2020), CKD. Pt presented to [facility] on 08/25/24 d/t right side weakness and dysarthria. Pt hypertensive on arrival with BP 226/122. Pt given labetalol  and Cardizem. CT negative for acute abnormalities. MRI revealed acute left basal ganglia and periventricular white matter ischemic infarction. Neurology consulted. CTA head/neck showed severe stenosis bilaterally and bilateral ICA. Therapy evaluations completed and CIR recommended d/t pt's deficits in functional mobility. Complete NIHSS TOTAL: 8   Patient's medical record from [facility] has been reviewed by the  rehabilitation admission coordinator and physician.   Past Medical History      Past Medical History:  Diagnosis Date   Carpal tunnel syndrome      bilateral   Chronic back pain      spondylolisthesis   Constipation      takes an OTC stool softener daily as needed   Gallstones     History of migraine     Hypertension     Nausea and vomiting 01/20/2024   Nocturia     Weakness      tingling           Has the patient had major surgery during 100 days prior to admission? No   Family History   family history is not on file.   Current Medications  Current Medications    Current Facility-Administered Medications:    acetaminophen  (TYLENOL ) tablet 650 mg, 650 mg, Oral, Q4H PRN, 650 mg at 08/29/24 2005 **OR** acetaminophen  (TYLENOL ) 160 MG/5ML solution 650 mg, 650 mg, Per Tube, Q4H PRN **OR** acetaminophen  (TYLENOL ) suppository 650 mg, 650  mg, Rectal, Q4H PRN, Sim, Mohammad L, MD   allopurinol  (ZYLOPRIM ) tablet 100 mg, 100 mg, Oral, Daily, Perri DELENA Meliton Mickey., MD, 100 mg at 08/30/24 0857   amLODipine  (NORVASC ) tablet 10 mg, 10 mg, Oral, Daily, Elgergawy, Dawood S, MD, 10 mg at 08/30/24 0857   aspirin  EC tablet 81 mg, 81 mg, Oral, Daily, Elgergawy, Dawood S, MD, 81 mg at 08/30/24 0857   atorvastatin  (LIPITOR) tablet 80 mg, 80 mg, Oral, Daily, Jerri Pfeiffer, MD, 80 mg at 08/30/24 0857   clopidogrel  (PLAVIX ) tablet 75 mg, 75 mg, Oral, Daily, Garba, Mohammad L, MD, 75 mg at 08/30/24 0857   clotrimazole (MYCELEX) troche 10 mg, 10 mg, Oral, 5 X Daily, Perri DELENA Meliton Mickey., MD, 10 mg at 08/30/24 0857   enoxaparin  (LOVENOX ) injection 40 mg, 40 mg, Subcutaneous, Daily, Sim, Mohammad L, MD, 40 mg at 08/30/24 0858   feeding supplement (ENSURE PLUS HIGH PROTEIN) liquid 237 mL, 237 mL, Oral, BID BM, Elgergawy, Dawood S, MD, 237 mL at 08/30/24 0858   fluticasone  furoate-vilanterol (BREO ELLIPTA ) 100-25 MCG/ACT 1 puff, 1 puff, Inhalation, Daily, Perri DELENA Meliton Mickey., MD, 1 puff at 08/30/24  9175   hydrALAZINE (APRESOLINE) injection 10 mg, 10 mg, Intravenous, Q4H PRN, Elgergawy, Dawood S, MD, 10 mg at 08/29/24 0452   hydrALAZINE (APRESOLINE) tablet 50 mg, 50 mg, Oral, Q6H, Elgergawy, Dawood S, MD   labetalol  (NORMODYNE ) injection 10 mg, 10 mg, Intravenous, Q2H PRN, Perri DELENA Meliton Mickey., MD, 10 mg at 08/28/24 0151   loratadine (CLARITIN) tablet 10 mg, 10 mg, Oral, Daily PRN, Elgergawy, Dawood S, MD, 10 mg at 08/29/24 2123   Muscle Rub CREA, , Topical, TID PRN, Elgergawy, Brayton RAMAN, MD, 1 Application at 08/29/24 2123   Oral care mouth rinse, 15 mL, Mouth Rinse, PRN, Elgergawy, Brayton RAMAN, MD   pantoprazole  (PROTONIX ) EC tablet 40 mg, 40 mg, Oral, Daily, Perri DELENA Meliton Mickey., MD, 40 mg at 08/30/24 0857   pneumococcal 20-valent conjugate vaccine (PREVNAR 20) injection 0.5 mL, 0.5 mL, Intramuscular, Prior to discharge, Perri DELENA Meliton Mickey., MD   senna-docusate (Senokot-S) tablet 1 tablet, 1 tablet, Oral, QHS PRN, Sim, Mohammad L, MD   sodium chloride  flush (NS) 0.9 % injection 10-40 mL, 10-40 mL, Intracatheter, Q12H, Elgergawy, Brayton RAMAN, MD, 10 mL at 08/30/24 0858   sodium chloride  flush (NS) 0.9 % injection 10-40 mL, 10-40 mL, Intracatheter, PRN, Elgergawy, Dawood S, MD     Patients Current Diet:  Diet Order                  Diet - low sodium heart healthy             DIET DYS 3 Room service appropriate? Yes with Assist; Fluid consistency: Thin  Diet effective now                         Precautions / Restrictions Precautions Precautions: Fall, Other (comment) Precaution/Restrictions Comments: R hemi Restrictions Weight Bearing Restrictions Per Provider Order: No    Has the patient had 2 or more falls or a fall with injury in the past year? No   Prior Activity Level   Prior Functional Level Self Care: Did the patient need help bathing, dressing, using the toilet or eating? Independent   Indoor Mobility: Did the patient need assistance with walking from room to  room (with or without device)? Independent   Stairs: Did the patient need assistance with internal or external  stairs (with or without device)? Independent   Functional Cognition: Did the patient need help planning regular tasks such as shopping or remembering to take medications? Independent   Patient Information Are you of Hispanic, Latino/a,or Spanish origin?: A. No, not of Hispanic, Latino/a, or Spanish origin What is your race?: B. Black or African American Do you need or want an interpreter to communicate with a doctor or health care staff?: 0. No   Patient's Response To:  Health Literacy and Transportation Is the patient able to respond to health literacy and transportation needs?: Yes Health Literacy - How often do you need to have someone help you when you read instructions, pamphlets, or other written material from your doctor or pharmacy?: Never In the past 12 months, has lack of transportation kept you from medical appointments or from getting medications?: No In the past 12 months, has lack of transportation kept you from meetings, work, or from getting things needed for daily living?: No   Home Assistive Devices / Equipment Home Equipment: Cane - single point, Shower seat   Prior Device Use: Indicate devices/aids used by the patient prior to current illness, exacerbation or injury? None of the above   Current Functional Level Cognition   Arousal/Alertness: Awake/alert Overall Cognitive Status: Impaired/Different from baseline Orientation Level: Oriented to person, Oriented to place, Oriented to situation, Disoriented to time Attention: Sustained Sustained Attention: Impaired Sustained Attention Impairment: Verbal basic, Functional basic Memory: Appears intact Awareness: Impaired Awareness Impairment: Intellectual impairment Problem Solving: Impaired Problem Solving Impairment: Verbal basic, Functional basic    Extremity Assessment (includes  Sensation/Coordination)   Upper Extremity Assessment: Defer to OT evaluation RUE Deficits / Details: PROM WFL with a lot of tone. No active functional movement noted. RUE Sensation: WNL RUE Coordination: decreased fine motor, decreased gross motor  Lower Extremity Assessment: RLE deficits/detail RLE Deficits / Details: increased tone, decreased isolated joint movement, moving in synergistic pattern, no active movement at ankle RLE Sensation: decreased proprioception     ADLs   Overall ADL's : Needs assistance/impaired Eating/Feeding: NPO Grooming: Oral care, Wash/dry face, Wash/dry hands, Moderate assistance, Applying deodorant, Brushing hair, Sitting Grooming Details (indicate cue type and reason): no movement in R side Upper Body Bathing: Moderate assistance, Sitting Lower Body Bathing: Maximal assistance, Sit to/from stand, Cueing for compensatory techniques Upper Body Dressing : Maximal assistance, Sitting Lower Body Dressing: Moderate assistance, Sit to/from stand, Cueing for compensatory techniques Toilet Transfer: Moderate assistance, Stand-pivot, BSC/3in1 (to strong side) Toilet Transfer Details (indicate cue type and reason): to strong side Toileting- Clothing Manipulation and Hygiene: Maximal assistance, Sit to/from stand Functional mobility during ADLs: Maximal assistance General ADL Comments: Pt very limited with all adls due to no movement in RUE. Pt moves RLE some but pt with a great amount of tone in both RUE and RLE     Mobility   Overal bed mobility: Needs Assistance Bed Mobility: Supine to Sit Supine to sit: Mod assist, HOB elevated Sit to supine: Mod assist, HOB elevated, Used rails General bed mobility comments: assist to bring R LE off EOB and support R UE     Transfers   Overall transfer level: Needs assistance Equipment used: 1 person hand held assist Transfers: Sit to/from Stand, Bed to chair/wheelchair/BSC Sit to Stand: Min assist Bed to/from  chair/wheelchair/BSC transfer type:: Stand pivot Stand pivot transfers: Mod assist General transfer comment: MinA to rise and ModA to stand-pivot. Face to face transfer with use of gait belt     Ambulation /  Gait / Stairs / Psychologist, prison and probation services   Ambulation/Gait General Gait Details: deferred due to need for +2 assist     Posture / Balance Dynamic Sitting Balance Sitting balance - Comments: intermittent MinA due to anterior lean from pt falling asleep Balance Overall balance assessment: Needs assistance Sitting-balance support: Feet supported, Single extremity supported Sitting balance-Leahy Scale: Poor Sitting balance - Comments: intermittent MinA due to anterior lean from pt falling asleep Postural control: Other (comment) (anterior) Standing balance support: Bilateral upper extremity supported, During functional activity Standing balance-Leahy Scale: Poor     Special considerations/life events  Bowel and Bladder Incontinence    Previous Home Environment (from acute therapy documentation) Living Arrangements: Spouse/significant other  Lives With: Significant other Available Help at Discharge: Family, Friend(s), Available 24 hours/day Type of Home: House Home Layout: One level Home Access: Stairs to enter Entrance Stairs-Rails: Right, Left, Can reach both Entrance Stairs-Number of Steps: 2 Bathroom Shower/Tub: Tub/shower unit, Curtain, Merchandiser, retail: Yes How Accessible: Accessible via walker Home Care Services: No Additional Comments: Pt lives with fiance who works full time   Discharge Living Setting Plans for Discharge Living Setting: Patient's home Type of Home at Discharge: House Discharge Home Layout: One level Discharge Home Access: Stairs to enter Entrance Stairs-Rails: Can reach both Entrance Stairs-Number of Steps: 2 Discharge Bathroom Shower/Tub: Tub/shower unit Discharge Bathroom Toilet: Standard Discharge Bathroom  Accessibility: Yes How Accessible: Accessible via walker Does the patient have any problems obtaining your medications?: No   Social/Family/Support Systems Anticipated Caregiver: Deborah Mcmahon (niece), Deborah Mcmahon (fiance) and family friend Anticipated Caregiver's Contact Information: [phone] Caregiver Availability: 24/7 Discharge Plan Discussed with Primary Caregiver: Yes Is Caregiver In Agreement with Plan?: Yes Does Caregiver/Family have Issues with Lodging/Transportation while Pt is in Rehab?: No   Goals Patient/Family Goal for Rehab: Supervision: PT/OT, Mod I-Superivison:ST Expected length of stay: 14-16 days Pt/Family Agrees to Admission and willing to participate: Yes Program Orientation Provided & Reviewed with Pt/Caregiver Including Roles  & Responsibilities: Yes   Decrease burden of Care through IP rehab admission: NA   Possible need for SNF placement upon discharge: Not anticipated   Patient Condition: I have reviewed medical records from [facility], spoken with CM, and patient and family member. I met with patient at the bedside and discussed via phone for inpatient rehabilitation assessment.  Patient will benefit from ongoing PT, OT, and SLP, can actively participate in 3 hours of therapy a day 5 days of the week, and can make measurable gains during the admission.  Patient will also benefit from the coordinated team approach during an Inpatient Acute Rehabilitation admission.  The patient will receive intensive therapy as well as Rehabilitation physician, nursing, social worker, and care management interventions.  Due to bladder management, bowel management, safety, disease management, medication administration, pain management, and patient education the patient requires 24 hour a day rehabilitation nursing.  The patient is currently Min-Mod A with mobility and Mod-Max A with basic ADLs.  Discharge setting and therapy post discharge at home with home health is  anticipated.  Patient has agreed to participate in the Acute Inpatient Rehabilitation Program and will admit today.   Preadmission Screen Completed By:  Tinnie SHAUNNA Yvone Delayne, 08/30/2024 10:26 AM ______________________________________________________________________   Discussed status with Dr. Carilyn on 08/30/24  at 10:26 AM and received approval for admission today.   Admission Coordinator:  Tinnie SHAUNNA Yvone Delayne, CCC-SLP, time 10:26 AM/Date 08/30/24     Assessment/Plan: Diagnosis: Left subcortical infarct  Does the need for close, 24 hr/day Medical supervision in concert with the patient's rehab needs make it unreasonable for this patient to be served in a less intensive setting? Yes Co-Morbidities requiring supervision/potential complications: Dysphagia , CKD 3b, COPD, gout, morbid obesity, HTN Due to bladder management, bowel management, safety, skin/wound care, disease management, medication administration, pain management, and patient education, does the patient require 24 hr/day rehab nursing? Yes Does the patient require coordinated care of a physician, rehab nurse, PT, OT, and SLP to address physical and functional deficits in the context of the above medical diagnosis(es)? Yes Addressing deficits in the following areas: balance, endurance, locomotion, strength, transferring, bowel/bladder control, bathing, dressing, feeding, grooming, toileting, cognition, swallowing, and psychosocial support Can the patient actively participate in an intensive therapy program of at least 3 hrs of therapy 5 days a week? Yes The potential for patient to make measurable gains while on inpatient rehab is good Anticipated functional outcomes upon discharge from inpatient rehab: modified independent and supervision PT, modified independent and supervision OT, modified independent and supervision SLP Estimated rehab length of stay to reach the above functional goals is: 14-16d Anticipated discharge  destination: Home 10. Overall Rehab/Functional Prognosis: good     MD Signature: Prentice CHARLENA Compton M.D. [facility] Fellow Am Acad of [employer] Med and Rehab Diplomate Am Board of Electrodiagnostic Med Fellow Am Board of Interventional Pain

## 2024-08-30 NOTE — H&P (Addendum)
 Physical Medicine and Rehabilitation Admission H&P        Chief Complaint  Patient presents with   Functional deficits due to stroke.      HPI:  Deborah Mcmahon. Defrancesco is a 63 year old female with history of HTN, chronic LBP, migraines, cerebellar CVA, 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, who was admitted on 08/25/24 with slurred speech, facial droop and right sided weakness.  Patient reported waking up with symptoms early arm, BP 226/122 at admission and was outside window for tPA. CTA head showed stable cerebellar stroke. MRI brain showed left basal ganglia infarct with periventicular white matter ischemic infarction. Neurology questioned stroke secondary to chronic hypertensive angiopathy v/s cardioembolic v/s atheroembolic cause but  UDS + cocaine and also question of vasospasm. 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    She received IVF and and BP meds held to allow for adequate perfusion. Dr. Jerri felt that stroke likely due to small vessel disease and recommended DAPT X 3 weeks followed by Plavix  alone (as noncompliant with plavix  PTA) Long term BP goal 130-`50 given severe bilateral artery stenosis.  PT/OT consulted and working on pregait activity due to severe right sided weakness and increased tone noted on right. She requires +2 mod assist with equipment for tranfers and mod to max assist with ADLs. Also noted to have difficulty following multi-step commands. with      Review of Systems  Constitutional:  Negative for chills and fever.  HENT:  Negative for congestion.   Eyes:  Negative for discharge and redness.  Respiratory:  Negative for sputum production, shortness of breath and stridor.   Cardiovascular:  Negative for chest pain and palpitations.  Gastrointestinal:  Negative  for heartburn, nausea and vomiting.  Genitourinary:  Negative for dysuria and hematuria.  Musculoskeletal:  Positive for back pain.       CLBP  Skin:  Negative for rash.  Neurological:  Positive for speech change and focal weakness.  Endo/Heme/Allergies:  Negative for environmental allergies.  Psychiatric/Behavioral:  Positive for substance abuse. The patient is nervous/anxious.             Past Medical History:  Diagnosis Date   Carpal tunnel syndrome      bilateral   Chronic back pain      spondylolisthesis   Constipation      takes an OTC stool softener daily as needed   Gallstones     History of migraine     Hypertension     Nausea and vomiting 01/20/2024   Nocturia     Weakness      tingling                Past Surgical History:  Procedure Laterality Date   BACK SURGERY        fusion   BIOPSY   12/31/2020    Procedure: BIOPSY;  Surgeon: Wilhelmenia Aloha Raddle., MD;  Location: [facility] ENDOSCOPY;  Service: Gastroenterology;;   CERVICAL SPINE SURGERY        x 4-fusion   COLONOSCOPY WITH PROPOFOL  N/A 12/31/2020    Procedure: COLONOSCOPY WITH PROPOFOL ;  Surgeon: Wilhelmenia Aloha Raddle., MD;  Location: [facility] ENDOSCOPY;  Service: Gastroenterology;  Laterality: N/A;   ESOPHAGOGASTRODUODENOSCOPY (EGD) WITH PROPOFOL  N/A 12/31/2020    Procedure: ESOPHAGOGASTRODUODENOSCOPY (EGD) WITH PROPOFOL ;  Surgeon: Wilhelmenia Aloha Raddle.,  MD;  Location: [facility] ENDOSCOPY;  Service: Gastroenterology;  Laterality: N/A;   MYRINGOTOMY       POLYPECTOMY   12/31/2020    Procedure: POLYPECTOMY;  Surgeon: Mansouraty, Aloha Raddle., MD;  Location: [facility] ENDOSCOPY;  Service: Gastroenterology;;   right knee surgery        screws   TONSILLECTOMY AND ADENOIDECTOMY       TUBAL LIGATION                   Family History  Problem Relation Age of Onset   Anesthesia problems Neg Hx            Social History:  Lives with fiance and was working PTA. Per  reports that she has been smoking cigarettes for 40+ years. She  has a 20.5 pack-year smoking history. She has never used smokeless tobacco. She reports that she does not drink alcohol  and does not use drugs.     Allergies:  Allergies  No Known Allergies             Medications Prior to Admission  Medication Sig Dispense Refill   acetaminophen  (TYLENOL ) 500 MG tablet Take 2 tablets (1,000 mg total) by mouth every 6 (six) hours as needed. 30 tablet 0   albuterol  (VENTOLIN  HFA) 108 (90 Base) MCG/ACT inhaler Inhale 2 puffs into the lungs every 6 (six) hours as needed for wheezing or shortness of breath.       allopurinol  (ZYLOPRIM ) 100 MG tablet Take 100 mg by mouth every other day.       amLODipine  (NORVASC ) 10 MG tablet Take 1 tablet (10 mg total) by mouth daily. (Patient taking differently: Take 10 mg by mouth every other day.) 30 tablet 3   atorvastatin  (LIPITOR) 40 MG tablet Take 1 tablet (40 mg total) by mouth daily. (Patient taking differently: Take 40 mg by mouth every other day.) 90 tablet 3   carvedilol  (COREG ) 6.25 MG tablet Take 1 tablet (6.25 mg total) by mouth 2 (two) times daily. Please call [phone] to schedule an appointment for future refills. Thank you. 1st attempt. (Patient taking differently: Take 6.25 mg by mouth every other day. Please call [phone] to schedule an appointment for future refills. Thank you. 1st attempt.) 60 tablet 0   clopidogrel  (PLAVIX ) 75 MG tablet Take 75 mg by mouth every other day.       losartan  (COZAAR ) 100 MG tablet Take 100 mg by mouth every other day.       methocarbamol (ROBAXIN) 750 MG tablet Take 750 mg by mouth 2 (two) times daily as needed for muscle spasms.       [DISCONTINUED] fluticasone  furoate-vilanterol (BREO ELLIPTA ) 100-25 MCG/ACT AEPB Inhale 1 puff into the lungs daily. (Patient not taking: Reported on 08/27/2024) 60 each 0   [DISCONTINUED] pantoprazole  (PROTONIX ) 40 MG tablet Take 40 mg by mouth daily. (Patient not taking: Reported on 08/27/2024)                Home: Home  Living Family/patient expects to be discharged to:: Private residence Living Arrangements: Spouse/significant other Available Help at Discharge: Family, Friend(s), Available 24 hours/day Type of Home: House Home Access: Stairs to enter [employer] of Steps: 2 Entrance Stairs-Rails: Right, Left, Can reach both Home Layout: One level Bathroom Shower/Tub: Tub/shower unit, Curtain, Door [employer]: Standard Bathroom Accessibility: Yes Home Equipment: Cane - single point, Shower seat Additional Comments: Pt lives with fiance who works full time  Lives With: Significant other   Functional  History: Prior Function Prior Level of Function : Independent/Modified Independent, Driving Mobility Comments: has a cane but never uses it ADLs Comments: fully independent and driving   Functional Status:  Mobility: Bed Mobility Overal bed mobility: Needs Assistance Bed Mobility: Supine to Sit Supine to sit: Mod assist, HOB elevated Sit to supine: Mod assist, HOB elevated, Used rails General bed mobility comments: Pt in chair on arrival Transfers Overall transfer level: Needs assistance Equipment used: 1 person hand held assist, Rolling walker (2 wheels) Transfers: Sit to/from Stand, Bed to chair/wheelchair/BSC Sit to Stand: Mod assist, +2 safety/equipment, +2 physical assistance Bed to/from chair/wheelchair/BSC transfer type:: Step pivot Stand pivot transfers: Mod assist Step pivot transfers: Max assist General transfer comment: Mod A +2 to rise from recliner. Pt needed assist to place right UE on RW for support only with pt unable to grip.  Needed min assist to maintain static stance. Pt practiced stepping right LE forward and backward with cues and mod assist for safety with 2nd person present for safety as well.  Pt practiced weight shifting as well.  Pt could not step left LE even with mod cues and mod assist as right LE weak and buckles if all weight is on  it. Ambulation/Gait General Gait Details: deferred as pt too weak to take steps yet   ADL: ADL Overall ADL's : Needs assistance/impaired Eating/Feeding: Minimal assistance, Sitting Eating/Feeding Details (indicate cue type and reason): pt feeding self with L hand only. Pt requires cues to slow down and clear food out of R side of mouth as pt tends to pocket food. Pt needs min assist and min cues to slow down. Grooming: Wash/dry hands, Wash/dry face, Oral care, Set up, Sitting Grooming Details (indicate cue type and reason): using RUE as a gross assist Upper Body Bathing: Moderate assistance, Sitting Lower Body Bathing: Maximal assistance, Sit to/from stand, Cueing for compensatory techniques Upper Body Dressing : Maximal assistance, Sitting Lower Body Dressing: Maximal assistance, Sit to/from stand, Cueing for compensatory techniques Lower Body Dressing Details (indicate cue type and reason): Pt with heavy R lean when dressing at EOB. Instructed pt on using RUE to weight bear in sitting instead of it dropping of front of bed.  Pt stood with min assist but unable to manage clothing in standing. Toilet Transfer: Moderate assistance, Stand-pivot, BSC/3in1 Toilet Transfer Details (indicate cue type and reason): to strong side Toileting- Clothing Manipulation and Hygiene: Maximal assistance, Sit to/from stand Functional mobility during ADLs: Moderate assistance General ADL Comments: Tone slightly improved in R side today. Pt very motivated to do for herself. Has increased independence doing adls in the chair when balance is supported.   Cognition: Cognition Overall Cognitive Status: Impaired/Different from baseline Arousal/Alertness: Awake/alert Orientation Level: Oriented to person, Oriented to place, Oriented to situation, Disoriented to time Attention: Sustained Sustained Attention: Impaired Sustained Attention Impairment: Verbal basic, Functional basic Memory: Appears intact Awareness:  Impaired Awareness Impairment: Intellectual impairment Problem Solving: Impaired Problem Solving Impairment: Verbal basic, Functional basic Cognition Arousal: Alert Behavior During Therapy: WFL for tasks assessed/performed Overall Cognitive Status: Impaired/Different from baseline       Blood pressure 129/76, pulse 96, temperature 97.7 F (36.5 C), temperature source Oral, resp. rate 17, SpO2 94%. Physical Exam   Lab Results Last 48 Hours  No results found for this or any previous visit (from the past 48 hours).   Imaging Results (Last 48 hours)  No results found.     General: No acute distress Mood and affect are appropriate  Heart: Regular rate and rhythm no rubs murmurs or extra sounds Lungs: Clear to auscultation, breathing unlabored, no rales or wheezes Abdomen: Positive bowel sounds, soft nontender to palpation, nondistended Extremities: No clubbing, cyanosis, or edema Skin: No evidence of breakdown, no evidence of rash Neurologic: Speech mild dysarthria, no signs of aphasia Cranial nerves II through XII intact, motor strength is 5/5 in left and tr/5 right  deltoid, bicep, tricep, grip,5/5 left and 3- right hip flexor, knee extensors, 0/5 right ankle dorsiflexor and plantar flexor Sensory exam normal sensation to light touch  in bilateral upper and lower extremities Cerebellar exam normal finger to nose to finger left upper ext Musculoskeletal: Full range of motion in all 4 extremities. No joint swelling      Blood pressure 129/76, pulse 96, temperature 97.7 F (36.5 C), temperature source Oral, resp. rate 17, SpO2 94%.   Medical Problem List and Plan: 1. Functional deficits secondary to Left basal ganglia infarct              -patient may  shower             -ELOS/Goals: 14-16d 2.  Antithrombotics: -DVT/anticoagulation:  Pharmaceutical: Lovenox              -antiplatelet therapy: ASA+ Plavix  x 21d then plavix  alone 3. Pain Management: TYlenol , on chronic robaxin prn  at home , hx lumbar surgery per Dr Gaither  4. Mood/Behavior/Sleep: consider melatonin if needed             -antipsychotic agents: NA Anxiety on prn atarax 5. Neuropsych/cognition: This patient is  capable of making decisions on her  own behalf. 6. Skin/Wound Care: Routine pressure relief measures 7. Fluids/Electrolytes/Nutrition: Monitor with serial checks.  8.  HTN: Monitor BP TID. Long term BP goal 130-150 range.              --amlodipine  10 mg/day and Hydralazine 50 mg qid.  9. Hyperlipidemia: LDL 118 and now on Lipitor 80 mg daily 10. H/o COPD: Has had cardiac/pulmonary work up for SOB/DOE.  --Has smoked for 40+ years. On Breo.  11. H/o cocaine use: Continue to educate on importance of cessation.  12. CKD III A: BUN/SCr 26/1.64 @ admission and 1.3-1.5 in the past year per chart review.  --Will monitor serially.  13. Gout: On allopurinol .  14. H/o GIB/PUD:  Monitor H/H due to DAPT on board. On PPI.  15. Thrush: Treated with mycelex torches.  16. Prominent adenoids: Follow up with ENT for evaluation after discharge.       Sharlet GORMAN Schmitz, PA-C 08/30/2024 I have personally performed a face to face diagnostic evaluation of this patient.  Additionally, I have reviewed and concur with the physician assistant's documentation above. Prentice CHARLENA Compton M.D. [facility] Fellow Am Acad of Phys Med and Rehab Diplomate Am Board of Electrodiagnostic Med Fellow Am Board of Interventional Pain

## 2024-08-30 NOTE — Discharge Instructions (Signed)
Management per CIR 

## 2024-08-30 NOTE — Progress Notes (Signed)
 Inpatient Rehab Admissions Coordinator:  There is a bed available for pt in CIR today. Dr. Sherlon aware and in agreement. Pt, pt's niece [name], NSG and TOC made aware.  Tinnie Yvone Cohens, MS, CCC-SLP Admissions Coordinator [phone]

## 2024-08-31 DIAGNOSIS — F1411 Cocaine abuse, in remission: Secondary | ICD-10-CM

## 2024-08-31 DIAGNOSIS — M961 Postlaminectomy syndrome, not elsewhere classified: Secondary | ICD-10-CM

## 2024-08-31 LAB — COMPREHENSIVE METABOLIC PANEL WITH GFR
ALT: 31 U/L (ref 0–44)
AST: 32 U/L (ref 15–41)
Albumin: 2.9 g/dL — ABNORMAL LOW (ref 3.5–5.0)
Alkaline Phosphatase: 44 U/L (ref 38–126)
Anion gap: 9 (ref 5–15)
BUN: 27 mg/dL — ABNORMAL HIGH (ref 8–23)
CO2: 20 mmol/L — ABNORMAL LOW (ref 22–32)
Calcium: 8.9 mg/dL (ref 8.9–10.3)
Chloride: 110 mmol/L (ref 98–111)
Creatinine, Ser: 1.71 mg/dL — ABNORMAL HIGH (ref 0.44–1.00)
GFR, Estimated: 33 mL/min — ABNORMAL LOW (ref >60)
Glucose, Bld: 93 mg/dL (ref 70–99)
Potassium: 3.6 mmol/L (ref 3.5–5.1)
Sodium: 139 mmol/L (ref 135–145)
Total Bilirubin: 0.7 mg/dL (ref 0.0–1.2)
Total Protein: 6.3 g/dL — ABNORMAL LOW (ref 6.5–8.1)

## 2024-08-31 LAB — CBC WITH DIFFERENTIAL/PLATELET
Abs Immature Granulocytes: 0.02 K/uL (ref 0.00–0.07)
Basophils Absolute: 0 K/uL (ref 0.0–0.1)
Basophils Relative: 0 %
Eosinophils Absolute: 0.1 K/uL (ref 0.0–0.5)
Eosinophils Relative: 1 %
HCT: 36.7 % (ref 36.0–46.0)
Hemoglobin: 12.1 g/dL (ref 12.0–15.0)
Immature Granulocytes: 0 %
Lymphocytes Relative: 38 %
Lymphs Abs: 2.3 K/uL (ref 0.7–4.0)
MCH: 28.6 pg (ref 26.0–34.0)
MCHC: 33 g/dL (ref 30.0–36.0)
MCV: 86.8 fL (ref 80.0–100.0)
Monocytes Absolute: 0.7 K/uL (ref 0.1–1.0)
Monocytes Relative: 12 %
Neutro Abs: 2.9 K/uL (ref 1.7–7.7)
Neutrophils Relative %: 49 %
Platelets: 224 K/uL (ref 150–400)
RBC: 4.23 MIL/uL (ref 3.87–5.11)
RDW: 15.6 % — ABNORMAL HIGH (ref 11.5–15.5)
WBC: 6 K/uL (ref 4.0–10.5)
nRBC: 0 % (ref 0.0–0.2)

## 2024-08-31 MED ORDER — SODIUM CHLORIDE 0.9% FLUSH: 10.0000 mL | Freq: Two times a day (BID) | INTRAVENOUS | Status: DC

## 2024-08-31 MED ORDER — SODIUM CHLORIDE 0.9% FLUSH: 10.0000 mL | INTRAVENOUS | Status: DC | PRN

## 2024-08-31 MED ORDER — CHLORHEXIDINE GLUCONATE CLOTH 2 % EX PADS
6.0000 | MEDICATED_PAD | Freq: Every day | CUTANEOUS | Status: DC
  Administered 2024-08-31: 6 via TOPICAL

## 2024-08-31 MED ORDER — LIDOCAINE 5 % EX PTCH
1.0000 | MEDICATED_PATCH | CUTANEOUS | Status: DC
  Administered 2024-08-31: 1 via TRANSDERMAL
  Filled 2024-08-31: qty 1

## 2024-08-31 MED ORDER — CAMPHOR-MENTHOL 0.5-0.5 % EX LOTN: TOPICAL_LOTION | CUTANEOUS | Status: DC | PRN

## 2024-08-31 MED ORDER — LORATADINE 10 MG PO TABS: 10.0000 mg | ORAL_TABLET | Freq: Every day | ORAL | Status: AC | PRN

## 2024-08-31 MED ORDER — ACETAMINOPHEN 325 MG PO TABS: 650.0000 mg | ORAL_TABLET | ORAL | Status: AC | PRN

## 2024-08-31 NOTE — Plan of Care (Signed)
  Problem: Consults Goal: RH STROKE PATIENT EDUCATION Description: See Patient Education module for education specifics  Outcome: Progressing   Problem: RH BOWEL ELIMINATION Goal: RH STG MANAGE BOWEL WITH ASSISTANCE Description: STG Manage Bowel with mod I Assistance. Outcome: Progressing Goal: RH STG MANAGE BOWEL W/MEDICATION W/ASSISTANCE Description: STG Manage Bowel with Medication with mod I Assistance. Outcome: Progressing   Problem: RH BLADDER ELIMINATION Goal: RH STG MANAGE BLADDER WITH ASSISTANCE Description: STG Manage Bladder With mod I Assistance Outcome: Progressing   Problem: RH SAFETY Goal: RH STG ADHERE TO SAFETY PRECAUTIONS W/ASSISTANCE/DEVICE Description: STG Adhere to Safety Precautions With cues Assistance/Device. Outcome: Progressing   Problem: RH PAIN MANAGEMENT Goal: RH STG PAIN MANAGED AT OR BELOW PT'S PAIN GOAL Description: Pain < 4 with prns Outcome: Progressing   Problem: RH KNOWLEDGE DEFICIT Goal: RH STG INCREASE KNOWLEDGE OF HYPERTENSION Description: Patient and family will be able to manage HTN using educational resources for medications and dietary modification independently Outcome: Progressing Goal: RH STG INCREASE KNOWLEDGE OF DYSPHAGIA/FLUID INTAKE Description: Patient and family will be able to manage Dysphagia using educational resources for medications and dietary modification independently Outcome: Progressing Goal: RH STG INCREASE KNOWLEGDE OF HYPERLIPIDEMIA Description: Patient and family will be able to manage HLD using educational resources for medications and dietary modification independently Outcome: Progressing Goal: RH STG INCREASE KNOWLEDGE OF STROKE PROPHYLAXIS Description: Patient and family will be able to manage secondary risks using educational resources for medications and dietary modification independently Outcome: Progressing

## 2024-08-31 NOTE — Progress Notes (Signed)
 Patient signed AMA form against advice from [place], [place]. Patient was encouraged to stay and was educated on the risks of leaving before treatment was completed. Dr. Carilyn and Sharlet Schmitz, PA was notified.

## 2024-08-31 NOTE — Evaluation (Addendum)
 Speech Language Pathology Assessment and Plan  Patient Details  Name: Deborah Mcmahon MRN: [id number] Date of Birth: 1961/09/20  SLP Diagnosis: Dysarthria;Cognitive Impairments;Speech and Language deficits;Dysphagia  Rehab Potential: Good ELOS: 3 weeks    Today's Date: 08/31/2024 SLP Individual Time: 0800-0900 SLP Individual Time Calculation (min): 60 min   Hospital Problem: Principal Problem:   Acute stroke of basal ganglia (HCC)  Past Medical History:  Past Medical History:  Diagnosis Date   Carpal tunnel syndrome    bilateral   Chronic back pain    spondylolisthesis   Constipation    takes an OTC stool softener daily as needed   Gallstones    History of migraine    Hypertension    Nausea and vomiting 01/20/2024   Nocturia    Weakness    tingling    Past Surgical History:  Past Surgical History:  Procedure Laterality Date   BACK SURGERY     fusion   BIOPSY  12/31/2020   Procedure: BIOPSY;  Surgeon: Wilhelmenia Aloha Raddle., MD;  Location: [facility] ENDOSCOPY;  Service: Gastroenterology;;   CERVICAL SPINE SURGERY     x 4-fusion   COLONOSCOPY WITH PROPOFOL  N/A 12/31/2020   Procedure: COLONOSCOPY WITH PROPOFOL ;  Surgeon: Wilhelmenia Aloha Raddle., MD;  Location: [facility] ENDOSCOPY;  Service: Gastroenterology;  Laterality: N/A;   ESOPHAGOGASTRODUODENOSCOPY (EGD) WITH PROPOFOL  N/A 12/31/2020   Procedure: ESOPHAGOGASTRODUODENOSCOPY (EGD) WITH PROPOFOL ;  Surgeon: Wilhelmenia Aloha Raddle., MD;  Location: [facility] ENDOSCOPY;  Service: Gastroenterology;  Laterality: N/A;   MYRINGOTOMY     POLYPECTOMY  12/31/2020   Procedure: POLYPECTOMY;  Surgeon: Wilhelmenia Aloha Raddle., MD;  Location: [facility] ENDOSCOPY;  Service: Gastroenterology;;   right knee surgery     screws   TONSILLECTOMY AND ADENOIDECTOMY     TUBAL LIGATION      Assessment / Plan / Recommendation Clinical Impression Pt is a 63 year old female with history of HTN, chronic LBP, migraines, cerebellar CVA, 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, who was admitted on 08/25/24  with slurred speech, facial droop and right sided weakness. Patient reported waking up with symptoms early arm, BP 226/122 at admission and was outside window for tPA. CTA head showed stable cerebellar stroke. MRI brain showed left basal ganglia infarct with periventicular white matter ischemic infarction. Neurology questioned stroke secondary to chronic hypertensive angiopathy v/s cardioembolic v/s atheroembolic cause but UDS + cocaine and also question of vasospasm. 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    Cognitive - Linguistic: Of note, minimal structured eval tasks completed d/t pt request for bathroom x2 and completion of morning meal. However, functionally, she presented w/ moderate dysarthria characterized by harsh vocal quality, imprecise articulation, dysprosody, and reduced breath support. She appeared SOB at sentence level. She was judged to be ~60% intelligible at sentence level w/o context. Functional expressive and receptive language intact. She was able to follow 1-2 step commands during toileting/transfer independently. Impulsivity and reduced problem solving appeared to negatively impact her success vs true language deficits. Additionally, moderate emergent awareness deficits noted. She would benefit from skilled ST services to target aforementioned deficits, maximize pt independence, and reduce caregiver burden.   Bedside Swallow: PO trials completed w/ morning meal, which comprised of Dys 3 textures and thin liquids. No overt s/s of airway invasion noted across trials, however, mild R buccal pocketing and oral residue  noted. Pt presented w/ minimal to no awareness of this. No concerns re pharyngeal dysphagia at this time. Given oral dysphagia, pt would  benefit from skilled ST services to target dysphagia. Additionally, she would benefit from set up of meal and intermittent supervision. Continue w/ meds crushed in puree, Dys 3 textures, and thin liquids.      Skilled Therapeutic Interventions          SLP facilitated a cognitive-linguistic and bedside swallow evaluation to assess pt's cognitive-communication skills and determine need for additional skilled ST services. See above for more information.    SLP Assessment  Patient will need skilled Speech Lanaguage Pathology Services during CIR admission    Recommendations  SLP Diet Recommendations: Dysphagia 3 (Mech soft);Thin Liquid Administration via: Straw Medication Administration: Crushed with puree Supervision: Patient able to self feed;Intermittent supervision to cue for compensatory strategies Compensations: Minimize environmental distractions;Slow rate;Small sips/bites Postural Changes and/or Swallow Maneuvers: Seated upright 90 degrees;Upright 30-60 min after meal Oral Care Recommendations: Oral care BID Recommendations for Other Services: Therapeutic Recreation consult Therapeutic Recreation Interventions: Pet therapy Patient destination: Home Follow up Recommendations: 24 hour supervision/assistance;Home Health SLP;Outpatient SLP Equipment Recommended: None recommended by SLP    SLP Frequency 3 to 5 out of 7 days   SLP Duration  SLP Intensity  SLP Treatment/Interventions 3 weeks  Minumum of 1-2 x/day, 30 to 90 minutes  Cognitive remediation/compensation;Speech/Language facilitation;Cueing hierarchy;Therapeutic Activities;Therapeutic Exercise;Patient/family education;Dysphagia/aspiration precaution training;Functional tasks;Internal/external aids;Oral motor exercises    Pain Pain Assessment Pain Scale: 0-10 Pain Score: 7  Pain Type: Chronic pain Pain Location: Back Pain Orientation: Lower Pain Descriptors / Indicators: Radiating;Pressure Pain Onset:  On-going Patients Stated Pain Goal: 2 Pain Intervention(s): Medication (See eMAR)  Prior Functioning Cognitive/Linguistic Baseline: Within functional limits Type of Home: House  Lives With: Significant other Available Help at Discharge: Family;Friend(s);Available 24 hours/day Vocation: On disability  SLP Evaluation Cognition Overall Cognitive Status: Impaired/Different from baseline Arousal/Alertness: Awake/alert Orientation Level: Oriented X4 Year: 2025 Month: October Day of Week: Correct Attention: Selective Selective Attention: Impaired Memory: Appears intact Awareness: Impaired Awareness Impairment: Emergent impairment Problem Solving: Impaired Problem Solving Impairment: Functional basic;Verbal basic Executive Function: Reasoning;Organizing;Self Monitoring;Self Correcting Reasoning: Impaired Organizing: Impaired Self Monitoring: Impaired Self Correcting: Impaired Behaviors: Impulsive Safety/Judgment: Impaired Comments: reduced awareness, impulsivity, and ataxia negatively impact success  Comprehension Auditory Comprehension Overall Auditory Comprehension: Appears within functional limits for tasks assessed Expression Expression Primary Mode of Expression: Verbal Verbal Expression Overall Verbal Expression: Appears within functional limits for tasks assessed Oral Motor Oral Motor/Sensory Function Overall Oral Motor/Sensory Function: Moderate impairment Facial ROM: Reduced right Facial Symmetry: Abnormal symmetry right Facial Strength: Reduced right Lingual ROM: Reduced right Lingual Symmetry: Abnormal symmetry right Lingual Strength: Reduced Motor Speech Overall Motor Speech: Impaired Respiration: Impaired Level of Impairment: Sentence Phonation: Other (comment) (harsh) Resonance: Within functional limits Articulation: Impaired Level of Impairment: Sentence Intelligibility: Intelligibility reduced Word: 75-100% accurate Phrase: 50-74%  accurate Sentence: 50-74% accurate Conversation: 25-49% accurate Motor Planning: Within functional limits Motor Speech Errors: Aware Effective Techniques: Slow rate;Pause;Over-articulate  Care Tool Care Tool Cognition Ability to hear (with hearing aid or hearing appliances if normally used Ability to hear (with hearing aid or hearing appliances if normally used): 0. Adequate - no difficulty in normal conservation, social interaction, listening to TV   Expression of Ideas and Wants Expression of Ideas and Wants: 3. Some difficulty - exhibits some difficulty with expressing needs and ideas (e.g, some words or finishing thoughts) or speech is not clear   Understanding  Verbal and Non-Verbal Content Understanding Verbal and Non-Verbal Content: 3. Usually understands - understands most conversations, but misses some part/intent of message. Requires cues at times to understand  Memory/Recall Ability Memory/Recall Ability : Current season;That he or she is in a hospital/hospital unit   Motor Speech Assessment   Intelligibility: Intelligibility reduced Word: 75-100% accurate Phrase: 50-74% accurate Sentence: 50-74% accurate Conversation: 25-49% accurate  Bedside Swallowing Assessment General Diet Prior to this Study: Dysphagia 3 (mechanical soft);Thin liquids (Level 0) Temperature Spikes Noted: No Respiratory Status: Room air History of Recent Intubation: No Behavior/Cognition: Alert;Cooperative;Pleasant mood Oral Cavity - Dentition: Missing dentition Self-Feeding Abilities: Able to feed self;Needs set up Vision: Functional for self-feeding Patient Positioning: Upright in chair/Tumbleform Baseline Vocal Quality: Other (comment) (harsh) Volitional Cough: Strong Volitional Swallow: Able to elicit  Thin Liquid Thin Liquid: Within functional limits Presentation: Straw Oral Phase Impairments: Reduced labial seal Solid Solid: Impaired Presentation: Self Fed Oral Phase Impairments:  Impaired mastication Oral Phase Functional Implications: Right lateral sulci pocketing   Short Term Goals: Week 1: SLP Short Term Goal 1 (Week 1): Pt will tolerate Dys 3 textures, clearing any oral residue or pocketing w/ only s cues SLP Short Term Goal 2 (Week 1): Pt will utilize compensatory speech strategies at sentence level w/ minA to maintain 80% intelligibility or greater SLP Short Term Goal 3 (Week 1): Pt will solve functional problems w/ minA SLP Short Term Goal 4 (Week 1): Pt will complete motor speech exercises w/ minA  Refer to Care Plan for Long Term Goals  Recommendations for other services: Therapeutic Recreation  Pet therapy  Discharge Criteria: Patient will be discharged from SLP if patient refuses treatment 3 consecutive times without medical reason, if treatment goals not met, if there is a change in medical status, if patient makes no progress towards goals or if patient is discharged from hospital.  The above assessment, treatment plan, treatment alternatives and goals were discussed and mutually agreed upon: by patient  Recardo DELENA Mole 08/31/2024, 5:12 PM

## 2024-08-31 NOTE — Plan of Care (Signed)
  Problem: RH Balance Goal: LTG Patient will maintain dynamic standing with ADLs (OT) Description: LTG:  Patient will maintain dynamic standing balance with assist during activities of daily living (OT)  Flowsheets (Taken 08/31/2024 1526) LTG: Pt will maintain dynamic standing balance during ADLs with: Contact Guard/Touching assist   Problem: Sit to Stand Goal: LTG:  Patient will perform sit to stand in prep for activites of daily living with assistance level (OT) Description: LTG:  Patient will perform sit to stand in prep for activites of daily living with assistance level (OT) Flowsheets (Taken 08/31/2024 1526) LTG: PT will perform sit to stand in prep for activites of daily living with assistance level: Contact Guard/Touching assist   Problem: RH Grooming Goal: LTG Patient will perform grooming w/assist,cues/equip (OT) Description: LTG: Patient will perform grooming with assist, with/without cues using equipment (OT) Flowsheets (Taken 08/31/2024 1526) LTG: Pt will perform grooming with assistance level of: Supervision/Verbal cueing   Problem: RH Bathing Goal: LTG Patient will bathe all body parts with assist levels (OT) Description: LTG: Patient will bathe all body parts with assist levels (OT) Flowsheets (Taken 08/31/2024 1526) LTG: Pt will perform bathing with assistance level/cueing: Minimal Assistance - Patient > 75%   Problem: RH Dressing Goal: LTG Patient will perform upper body dressing (OT) Description: LTG Patient will perform upper body dressing with assist, with/without cues (OT). Flowsheets (Taken 08/31/2024 1526) LTG: Pt will perform upper body dressing with assistance level of: Supervision/Verbal cueing Goal: LTG Patient will perform lower body dressing w/assist (OT) Description: LTG: Patient will perform lower body dressing with assist, with/without cues in positioning using equipment (OT) Flowsheets (Taken 08/31/2024 1526) LTG: Pt will perform lower body dressing with  assistance level of: Minimal Assistance - Patient > 75%   Problem: RH Toileting Goal: LTG Patient will perform toileting task (3/3 steps) with assistance level (OT) Description: LTG: Patient will perform toileting task (3/3 steps) with assistance level (OT)  Flowsheets (Taken 08/31/2024 1526) LTG: Pt will perform toileting task (3/3 steps) with assistance level: Minimal Assistance - Patient > 75%   Problem: RH Functional Use of Upper Extremity Goal: LTG Patient will use RT/LT upper extremity as a (OT) Description: LTG: Patient will use right/left upper extremity as a stabilizer/gross assist/diminished/nondominant/dominant level with assist, with/without cues during functional activity (OT) Flowsheets (Taken 08/31/2024 1526) LTG: Use of upper extremity in functional activities: RUE as a stabilizer   Problem: RH Toilet Transfers Goal: LTG Patient will perform toilet transfers w/assist (OT) Description: LTG: Patient will perform toilet transfers with assist, with/without cues using equipment (OT) Flowsheets (Taken 08/31/2024 1526) LTG: Pt will perform toilet transfers with assistance level of: Minimal Assistance - Patient > 75%   Problem: RH Tub/Shower Transfers Goal: LTG Patient will perform tub/shower transfers w/assist (OT) Description: LTG: Patient will perform tub/shower transfers with assist, with/without cues using equipment (OT) Flowsheets (Taken 08/31/2024 1526) LTG: Pt will perform tub/shower stall transfers with assistance level of: Minimal Assistance - Patient > 75%

## 2024-08-31 NOTE — Discharge Instructions (Addendum)
 Inpatient Rehab Discharge Instructions  Deborah Mcmahon Discharge date and time: No discharge date for patient encounter.   Activities/Precautions/ Functional Status: Activity: no lifting, driving, or strenuous exercise till cleared by MD Diet: low fat, low cholesterol diet Wound Care: none needed   Functional status:  ___ No restrictions     ___ Walk up steps independently ___ 24/7 supervision/assistance   ___ Walk up steps with assistance ___ Intermittent supervision/assistance  ___ Bathe/dress independently ___ Walk with walker     _X__ Bathe/dress with assistance ___ Walk Independently    ___ Shower independently ___ Walk with assistance    ___ Shower with assistance _X__ No alcohol      ___ Return to work/school ________   Special Instructions: No Marijuana or cocaine-->this affects your brain and will cause strokes.    STROKE/TIA DISCHARGE INSTRUCTIONS SMOKING Cigarette smoking nearly doubles your risk of having a stroke & is the single most alterable risk factor  If you smoke or have smoked in the last 12 months, you are advised to quit smoking for your health. Most of the excess cardiovascular risk related to smoking disappears within a year of stopping. Ask you doctor about anti-smoking medications [place] Quit Line: 1-800-QUIT NOW Free Smoking Cessation Classes [phone]  CHOLESTEROL Know your levels; limit fat & cholesterol in your diet  Lipid Panel     Component Value Date/Time   CHOL 171 08/26/2024 1319   TRIG 61 08/26/2024 1319   HDL 41 08/26/2024 1319   CHOLHDL 4.2 08/26/2024 1319   VLDL 12 08/26/2024 1319   LDLCALC 118 (H) 08/26/2024 1319     Many patients benefit from treatment even if their cholesterol is at goal. Goal: Total Cholesterol (CHOL) less than 160 Goal:  Triglycerides (TRIG) less than 150 Goal:  HDL greater than 40 Goal:  LDL (LDLCALC) less than 100   BLOOD PRESSURE American Stroke Association blood pressure target is less that 120/80 mm/Hg   Your discharge blood pressure is:  BP: 136/77 Monitor your blood pressure Limit your salt and alcohol  intake Many individuals will require more than one medication for high blood pressure  DIABETES (A1c is a blood sugar average for last 3 months) Goal HGBA1c is under 7% (HBGA1c is blood sugar average for last 3 months)  Diabetes: No known diagnosis of diabetes    Lab Results  Component Value Date   HGBA1C 5.1 08/26/2024    Your HGBA1c can be lowered with medications, healthy diet, and exercise. Check your blood sugar as directed by your physician Call your physician if you experience unexplained or low blood sugars.  PHYSICAL ACTIVITY/REHABILITATION Goal is 30 minutes at least 4 days per week  Activity: No driving, Therapies: see above Return to work: N/A Activity decreases your risk of heart attack and stroke and makes your heart stronger.  It helps control your weight and blood pressure; helps you relax and can improve your mood. Participate in a regular exercise program. Talk with your doctor about the best form of exercise for you (dancing, walking, swimming, cycling).  DIET/WEIGHT Goal is to maintain a healthy weight  Your discharge diet is:  Diet Order             DIET DYS 3 Room service appropriate? Yes with Assist; Fluid consistency: Thin  Diet effective now                   liquids Your height is:  Height: 5' 4 (162.6 cm) Your current weight is:  174 lbs Your Body Mass Index (BMI) is:  BMI (Calculated): 29.85 Following the type of diet specifically designed for you will help prevent another stroke. Your goal weight is:  145 lbs Your goal Body Mass Index (BMI) is 19-24. Healthy food habits can help reduce 3 risk factors for stroke:  High cholesterol, hypertension, and excess weight.  RESOURCES Stroke/Support Group:  Call [phone]   STROKE EDUCATION PROVIDED/REVIEWED AND GIVEN TO PATIENT Stroke warning signs and symptoms How to activate emergency medical system  (call 911). Medications prescribed at discharge. Need for follow-up after discharge. Personal risk factors for stroke. Pneumonia vaccine given:  Flu vaccine given:  My questions have been answered, the writing is legible, and I understand these instructions.  I will adhere to these goals & educational materials that have been provided to me after my discharge from the hospital.     My questions have been answered and I understand these instructions. I will adhere to these goals and the provided educational materials after my discharge from the hospital.  Patient/Caregiver Signature _______________________________ Date __________  Clinician Signature _______________________________________ Date __________  Please bring this form and your medication list with you to all your follow-up doctor's appointments.

## 2024-08-31 NOTE — Progress Notes (Signed)
 Inpatient Rehabilitation  Patient information reviewed and entered into eRehab system by [facility]. Karen Kays., CCC/SLP, PPS Coordinator.  Information including medical coding, functional ability and quality indicators will be reviewed and updated through discharge.

## 2024-08-31 NOTE — Progress Notes (Signed)
 Writer receives on coming shift report from nurse that pt has signed AMA form and will be going home as soon as her ride arrives. Pt is noted laying in bed on her left side with K pad draped over her back. Writer inquires what time pt ride will arrive to pick her up at which time pt states  I'm going to just stay here until in the morning.  0830 Pt significant other is in visiting pt bed alarm is activated Clinical research associate enters room and notes pt sitting on the side of the bed yelling for her significant other who is now noted pacing back and forth in the hall way talking on the phone. Pt significant other informs writer that he isn't taking her home. Charge nurse is on unit and goes into speak with pt and significant other pt continues to be very adamant about leaving despite numerous attempts being made by the charge nurse and Clinical research associate  to educate pt on the importance of  following the POC. Charge nurse Notifies Dan NP. Pt will be leaving AMA with significant other. 0848 Pt is noted sitting in wheel chair out side of room being pushed by significant other who both then are noted entering the elevator.

## 2024-08-31 NOTE — Progress Notes (Signed)
 Inpatient Rehabilitation Center Individual Statement of Services  Patient Name:  Deborah Mcmahon  Date:  08/31/2024  Welcome to the Inpatient Rehabilitation Center.  Our goal is to provide you with an individualized program based on your diagnosis and situation, designed to meet your specific needs.  With this comprehensive rehabilitation program, you will be expected to participate in at least 3 hours of rehabilitation therapies Monday-Friday, with modified therapy programming on the weekends.  Your rehabilitation program will include the following services:  Physical Therapy (PT), Occupational Therapy (OT), Speech Therapy (ST), 24 hour per day rehabilitation nursing, Therapeutic Recreaction (TR), Neuropsychology, Care Coordinator, Rehabilitation Medicine, Nutrition Services, and Pharmacy Services  Weekly team conferences will be held on Wednesday to discuss your progress.  Your Inpatient Rehabilitation Care Coordinator will talk with you frequently to get your input and to update you on team discussions.  Team conferences with you and your family in attendance may also be held.  Expected length of stay: 3 weeks  Overall anticipated outcome: min assist level  Depending on your progress and recovery, your program may change. Your Inpatient Rehabilitation Care Coordinator will coordinate services and will keep you informed of any changes. Your Inpatient Rehabilitation Care Coordinator's name and contact numbers are listed  below.  The following services may also be recommended but are not provided by the Inpatient Rehabilitation Center:  Driving Evaluations Home Health Rehabiltiation Services Outpatient Rehabilitation Services    Arrangements will be made to provide these services after discharge if needed.  Arrangements include referral to agencies that provide these services.  Your insurance has been verified to be:  [facility] Medicare Dual Complete Your primary doctor is:  Deborah Mcmahon  Pertinent  information will be shared with your doctor and your insurance company.  Inpatient Rehabilitation Care Coordinator:  Deborah Mcmahon, KEN [phone] or Deborah Mcmahon  Information discussed with and copy given to patient by: Deborah Mcmahon, 08/31/2024, 9:43 AM

## 2024-08-31 NOTE — Evaluation (Signed)
 Occupational Therapy Assessment and Plan  Patient Details  Name: Deborah Mcmahon MRN: [id number] Date of Birth: [birth year]  OT Diagnosis: abnormal posture, hemiplegia affecting dominant side, and muscle weakness (generalized) Rehab Potential: Rehab Potential (ACUTE ONLY): Good ELOS: 3 weeks   Today's Date: 08/31/2024 OT Individual Time: 9069-8951 OT Individual Time Calculation (min): 78 min     Hospital Problem: Principal Problem:   Acute stroke of basal ganglia (HCC)   Past Medical History:  Past Medical History:  Diagnosis Date   Carpal tunnel syndrome    bilateral   Chronic back pain    spondylolisthesis   Constipation    takes an OTC stool softener daily as needed   Gallstones    History of migraine    Hypertension    Nausea and vomiting 01/20/2024   Nocturia    Weakness    tingling    Past Surgical History:  Past Surgical History:  Procedure Laterality Date   BACK SURGERY     fusion   BIOPSY  12/31/2020   Procedure: BIOPSY;  Surgeon: Wilhelmenia Aloha Raddle., MD;  Location: [facility] ENDOSCOPY;  Service: Gastroenterology;;   CERVICAL SPINE SURGERY     x 4-fusion   COLONOSCOPY WITH PROPOFOL  N/A 12/31/2020   Procedure: COLONOSCOPY WITH PROPOFOL ;  Surgeon: Wilhelmenia Aloha Raddle., MD;  Location: [facility] ENDOSCOPY;  Service: Gastroenterology;  Laterality: N/A;   ESOPHAGOGASTRODUODENOSCOPY (EGD) WITH PROPOFOL  N/A 12/31/2020   Procedure: ESOPHAGOGASTRODUODENOSCOPY (EGD) WITH PROPOFOL ;  Surgeon: Wilhelmenia Aloha Raddle., MD;  Location: [facility] ENDOSCOPY;  Service: Gastroenterology;  Laterality: N/A;   MYRINGOTOMY     POLYPECTOMY  12/31/2020   Procedure: POLYPECTOMY;  Surgeon: Mansouraty, Aloha Raddle., MD;  Location: [facility] ENDOSCOPY;  Service: Gastroenterology;;   right knee surgery     screws   TONSILLECTOMY AND ADENOIDECTOMY     TUBAL LIGATION      Assessment & Plan Clinical Impression:   Deborah Mcmahon is a 63 year old female with history of HTN, chronic LBP, migraines, cerebellar CVA,  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, who was admitted on 08/25/24 with slurred speech, facial droop and right sided weakness.  Patient reported waking up with symptoms early arm, BP 226/122 at admission and was outside window for tPA. CTA head showed stable cerebellar stroke. MRI brain showed left basal ganglia infarct with periventicular white matter ischemic infarction. Neurology questioned stroke secondary to chronic hypertensive angiopathy v/s cardioembolic v/s atheroembolic cause but  UDS + cocaine and also question of vasospasm. 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    She received IVF and and BP meds held to allow for adequate perfusion. Dr. Jerri felt that stroke likely due to small vessel disease and recommended DAPT X 3 weeks followed by Plavix  alone (as noncompliant with plavix  PTA) Long term BP goal 130-`50 given severe bilateral artery stenosis. Patient transferred to CIR on 08/30/2024 .    Patient currently requires max with basic self-care skills secondary to muscle weakness and muscle joint tightness, decreased cardiorespiratoy endurance, impaired timing and sequencing, unbalanced muscle activation, decreased coordination, and decreased motor planning, decreased attention to right and decreased motor planning, decreased awareness, decreased problem solving, and decreased safety awareness, central origin, and decreased sitting balance, decreased standing balance, decreased postural control, hemiplegia, and decreased balance strategies.  Prior to hospitalization, patient could complete ADLs, IADLs, and work  with modified independent .  Patient will benefit from skilled intervention to decrease level of assist with basic self-care skills and increase independence with basic self-care  skills prior to discharge home with care partner.  Anticipate patient will require 24 hour supervision and minimal physical assistance and follow up home health.  OT - End of Session Activity Tolerance: Decreased this session Endurance Deficit: Yes Endurance Deficit Description: Frequent rest breaks provided during ADLs OT Assessment Rehab Potential (ACUTE ONLY): Good OT Barriers to Discharge: Decreased caregiver support OT Patient demonstrates impairments in the following area(s): Balance;Perception;Safety;Cognition;Sensory;Endurance;Motor;Pain OT Basic ADL's Functional Problem(s): Grooming;Bathing;Dressing;Toileting OT Transfers Functional Problem(s): Toilet;Tub/Shower OT Additional Impairment(s): Fuctional Use of Upper Extremity OT Plan OT Intensity: Minimum of 1-2 x/day, 45 to 90 minutes OT Frequency: 5 out of 7 days OT Duration/Estimated Length of Stay: 2.5-3 weeks OT Treatment/Interventions: Balance/vestibular training;Discharge planning;Functional electrical stimulation;Pain management;Self Care/advanced ADL retraining;UE/LE Coordination activities;Therapeutic Activities;Cognitive remediation/compensation;Disease mangement/prevention;Patient/family education;Functional mobility training;Skin care/wound managment;Therapeutic Exercise;Visual/perceptual remediation/compensation;Community reintegration;DME/adaptive equipment instruction;Neuromuscular re-education;Psychosocial support;Splinting/orthotics;UE/LE Strength taining/ROM;Wheelchair propulsion/positioning OT Self Feeding Anticipated Outcome(s): Set-up OT Basic Self-Care Anticipated Outcome(s): CGA-MIN A OT Toileting Anticipated Outcome(s): CGA OT Bathroom Transfers Anticipated Outcome(s): CGA-MIN A OT Recommendation Recommendations for Other Services: Therapeutic Recreation consult Therapeutic Recreation Interventions: Pet therapy;Stress management Patient destination: Home Follow Up Recommendations: 24 hour  supervision/assistance;Home health OT Equipment Recommended: To be determined   OT Evaluation Precautions/Restrictions  Precautions Precautions: Fall;Other (comment) (increased flexor tone R side) Recall of Precautions/Restrictions: Intact Precaution/Restrictions Comments: R hemi Restrictions Weight Bearing Restrictions Per Provider Order: No General Chart Reviewed: Yes Additional Pertinent History: HTN, chronic LBP, migraines, cerebellar CVA, 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 Pain Pain Assessment Pain Scale: 0-10 Pain Score: 5  Pain Location: Back Pain Orientation: Lower Pain Onset: With Activity Home Living/Prior Functioning Home Living Family/patient expects to be discharged to:: Private residence Living Arrangements: Spouse/significant other Available Help at Discharge: Family, Friend(s), Available 24 hours/day Type of Home: House Home Access: Stairs to enter [employer] of Steps: 3 Entrance Stairs-Rails: Right, Left, Can reach both Home Layout: One level Bathroom Shower/Tub: Tub/shower unit, Door, Engineer, building services: Standard Bathroom Accessibility: Yes  Lives With: Significant other, Friend(s) IADL History Homemaking Responsibilities: Yes Meal Prep Responsibility: Primary Laundry Responsibility: Primary Cleaning Responsibility: Primary Shopping Responsibility: Primary Child Care Responsibility: No Current License: Yes Mode of Transportation: Car Prior Function Level of Independence: Independent with basic ADLs, Independent with homemaking with ambulation, Independent with gait, Independent with transfers  Able to Take Stairs?: Yes Driving: Yes Vocation: Part time employment Vision Baseline Vision/History: 0 No visual deficits Ability to See in Adequate Light: 0 Adequate Patient Visual Report: No change from baseline Vision Assessment?: Yes Eye Alignment: Within Functional Limits Ocular Range of Motion: Within Functional Limits Alignment/Gaze Preference:  Within Defined Limits Tracking/Visual Pursuits: Able to track stimulus in all quads without difficulty Convergence: Within functional limits Visual Fields: No apparent deficits Perception  Perception: Impaired Perception-Other Comments: Mild inattention to R LE noted when standing during ADLs; Pt attending well to R UE and motivated to utilize during functional activities Praxis Praxis: Impaired Praxis Impairment Details: Landscape architect Overall Cognitive Status: Impaired/Different from baseline Arousal/Alertness: Awake/alert Orientation Level: Person;Place;Situation Person: Oriented Place: Oriented Situation: Oriented Memory: Appears intact Attention: Sustained Sustained Attention: Appears intact Awareness: Impaired Awareness Impairment: Emergent impairment (during ADLs) Problem Solving: Impaired Problem Solving Impairment: Functional basic (during ADLs) Safety/Judgment: Impaired Brief Interview for Mental Status (BIMS) Repetition of Three Words (First Attempt): 3 Temporal Orientation: Year: Correct Temporal Orientation: Month: Accurate within 5 days Temporal  Orientation: Day: Correct Recall: Sock: Yes, no cue required Recall: Blue: Yes, no cue required Recall: Bed: Yes, no cue required BIMS Summary Score: 15 Sensation Sensation Light Touch: Impaired by gross assessment Hot/Cold: Not tested Proprioception: Impaired by gross assessment Stereognosis: Not tested Coordination Gross Motor Movements are Fluid and Coordinated: No Fine Motor Movements are Fluid and Coordinated: No Coordination and Movement Description: Decreased coordination of R U/LE d/t hemiplegia Finger Nose Finger Test: Smooth qeual movements on L UE, unable to complete on R UE Motor  Motor Motor: Hemiplegia;Abnormal postural alignment and control;Abnormal tone Motor - Skilled Clinical Observations: R hemiplegia with tone present in R UE  Trunk/Postural Assessment  Cervical  Assessment Cervical Assessment: Exceptions to [facility] (forward head) Thoracic Assessment Thoracic Assessment: Exceptions to [facility] (rounded shoudlers) Lumbar Assessment Lumbar Assessment: Exceptions to [facility] (posterior pelvic tilt) Postural Control Postural Control: Deficits on evaluation Trunk Control: decreased Protective Responses: delayed  Balance Balance Balance Assessed: Yes Static Sitting Balance Static Sitting - Balance Support: Feet supported Static Sitting - Level of Assistance: 5: Stand by assistance (CGA) Dynamic Sitting Balance Dynamic Sitting - Balance Support: Feet supported Dynamic Sitting - Level of Assistance: 3: Mod assist;4: Min assist Static Standing Balance Static Standing - Balance Support: During functional activity;Bilateral upper extremity supported Static Standing - Level of Assistance: 3: Mod assist Dynamic Standing Balance Dynamic Standing - Balance Support: During functional activity Dynamic Standing - Level of Assistance: 2: Max assist;3: Mod assist (+2 present for safety) Extremity/Trunk Assessment RUE Assessment RUE Assessment: Exceptions to [facility] RUE Body System: Neuro Brunstrum levels for arm and hand: Arm;Hand Brunstrum level for arm: Stage I Presynergy Brunstrum level for hand: Stage II Synergy is developing RUE Tone RUE Tone: Moderate LUE Assessment LUE Assessment: Within Functional Limits  Care Tool Care Tool Self Care Eating   Eating Assist Level: Minimal Assistance - Patient > 75%    Oral Care    Oral Care Assist Level: Moderate Assistance - Patient 50 - 74%    Bathing   Body parts bathed by patient: Chest;Abdomen;Front perineal area;Face Body parts bathed by helper: Buttocks;Right upper leg;Left upper leg;Right lower leg;Left lower leg   Assist Level: Maximal Assistance - Patient 24 - 49%    Upper Body Dressing(including orthotics)   What is the patient wearing?: Pull over shirt   Assist Level: Maximal Assistance - Patient 25 -  49%    Lower Body Dressing (excluding footwear)   What is the patient wearing?: Pants Assist for lower body dressing: Total Assistance - Patient < 25%    Putting on/Taking off footwear   What is the patient wearing?: Shoes Assist for footwear: Total Assistance - Patient < 25%       Care Tool Toileting Toileting activity   Assist for toileting: Total Assistance - Patient < 25%     Care Tool Bed Mobility Roll left and right activity   Roll left and right assist level: Moderate Assistance - Patient 50 - 74%    Sit to lying activity   Sit to lying assist level: Maximal Assistance - Patient 25 - 49%    Lying to sitting on side of bed activity   Lying to sitting on side of bed assist level: the ability to move from lying on the back to sitting on the side of the bed with no back support.: Maximal Assistance - Patient 25 - 49%     Care Tool Transfers Sit to stand transfer   Sit to stand assist level: Maximal Assistance -  Patient 25 - 49% (2 person HHA)    Chair/bed transfer   Chair/bed transfer assist level: Maximal Assistance - Patient 25 - 49%     Toilet transfer   Assist Level: Maximal Assistance - Patient 24 - 49%     Care Tool Cognition  Expression of Ideas and Wants Expression of Ideas and Wants: 3. Some difficulty - exhibits some difficulty with expressing needs and ideas (e.g, some words or finishing thoughts) or speech is not clear  Understanding Verbal and Non-Verbal Content Understanding Verbal and Non-Verbal Content: 4. Understands (complex and basic) - clear comprehension without cues or repetitions   Memory/Recall Ability Memory/Recall Ability : Current season;Staff names and faces;That he or she is in a hospital/hospital unit   Refer to Care Plan for Long Term Goals  SHORT TERM GOAL WEEK 1 OT Short Term Goal 1 (Week 1): Pt will complete toilet transfers with MOD A using LRAD OT Short Term Goal 2 (Week 1): Pt will recall hemi-dressing techniques with MIN  questioning cues OT Short Term Goal 3 (Week 1): Pt will complete U/LB bathing with MOD A OT Short Term Goal 4 (Week 1): Pt will complete LB dressing with MOD A  Recommendations for other services: Therapeutic Recreation  Pet therapy and Stress management   Skilled Therapeutic Intervention Skilled Therapeutic Interventions/Progress Updates:  1:1 OT evaluation and intervention initiated with skilled education provided on OT role, goals, and POC. Pt received sitting up in recliner presenting to be in good spirits receptive to skilled OT session reporting 5/10 pain in back d/t chronic back pain- OT offering intermittent rest breaks, repositioning, and therapeutic support to optimize participation in therapy session. Pt completed ADLs and functional transfers at levels listed below this session. Flexor tone noted in R UE and extensor tone present in R LE. Pt quickly fatigued during ADLs requiring frequent seated rest breaks, activity pacing, and education provided on PLB'ing. +2 A present throughout session to assist with functional transfers and increase Pt safety. Pt would benefit from additional OT services in IPR setting. Pt was left resting in wc with call bell in reach, seatbelt alarm on, and all needs met.   ADL ADL Equipment Provided: Reacher Eating: Minimal assistance Where Assessed-Eating: Chair Grooming: Moderate assistance Where Assessed-Grooming: Sitting at sink Upper Body Bathing: Moderate assistance Where Assessed-Upper Body Bathing: Wheelchair Lower Body Bathing: Maximal assistance Where Assessed-Lower Body Bathing: Wheelchair;Standing at sink;Sitting at sink Upper Body Dressing: Maximal assistance Where Assessed-Upper Body Dressing: Wheelchair Lower Body Dressing: Maximal assistance Where Assessed-Lower Body Dressing: Wheelchair Toileting: Maximal assistance Where Assessed-Toileting: Hydrologist: Maximal assistance Statistician Method: Curator: Gaffer: Not assessed Film/video editor: Not assessed Mobility  Bed Mobility Bed Mobility: Supine to Sit;Sit to Supine Supine to Sit: Moderate Assistance - Patient 50-74%;Maximal Assistance - Patient - Patient 25-49% Sit to Supine: Moderate Assistance - Patient 50-74%;Maximal Assistance - Patient 25-49% Transfers Stand to Sit: Moderate Assistance - Patient 50-74%   Discharge Criteria: Patient will be discharged from OT if patient refuses treatment 3 consecutive times without medical reason, if treatment goals not met, if there is a change in medical status, if patient makes no progress towards goals or if patient is discharged from hospital.  The above assessment, treatment plan, treatment alternatives and goals were discussed and mutually agreed upon: by patient  Katheryn SHAUNNA Mines 08/31/2024, 1:05 PM

## 2024-08-31 NOTE — Progress Notes (Signed)
 Inpatient Rehab Admissions Coordinator:   On admission to CIR it became apparent that pt has a [employer] Dual complete plan with Medicare and Medicaid.  [employer] Medicare was not listed on her acute chart (facesheet, primary payor, etc).  On further review of account notes, [employer] Medicare was added to patient's chart on admission to the acute hospital, but removed from the chart on 10/3 prior to CIR consult.  Medicaid was verified to be active prior to admit.  We have notified pt/family and are seeking retro-auth with [facility] Medicare.  I reviewed benefits with pt's niece, [name], who verbalizes understanding.  Please contact me with questions.   Reche Lowers, PT, DPT Admissions Coordinator [phone] 08/31/24  2:00 PM

## 2024-08-31 NOTE — Progress Notes (Signed)
 PROGRESS NOTE   Subjective/Complaints:  No issues overnight, had a good bowel movement this morning, continent Review of systems positive for right sided flank pain, negative dysuria, negative fevers or chills, negative shortness of breath or chest pain  Objective: Labs reviewed 08/31/2024 No results found. Recent Labs    08/31/24 0448  WBC 6.0  HGB 12.1  HCT 36.7  PLT 224   Recent Labs    08/31/24 0448  NA 139  K 3.6  CL 110  CO2 20*  GLUCOSE 93  BUN 27*  CREATININE 1.71*  CALCIUM  8.9    Intake/Output Summary (Last 24 hours) at 08/31/2024 0812 Last data filed at 08/31/2024 0630 Gross per 24 hour  Intake --  Output 400 ml  Net -400 ml        Physical Exam: Vital Signs Blood pressure 136/77, pulse 85, temperature 97.7 F (36.5 C), resp. rate 17, height 5' 4 (1.626 m), weight 78.9 kg, SpO2 96%.    Assessment/Plan: 1. Functional deficits which require 3+ hours per day of interdisciplinary therapy in a comprehensive inpatient rehab setting. Physiatrist is providing close team supervision and 24 hour management of active medical problems listed below. Physiatrist and rehab team continue to assess barriers to discharge/monitor patient progress toward functional and medical goals  Care Tool:  Bathing              Bathing assist       Upper Body Dressing/Undressing Upper body dressing        Upper body assist      Lower Body Dressing/Undressing Lower body dressing            Lower body assist       Toileting Toileting    Toileting assist       Transfers Chair/bed transfer  Transfers assist           Locomotion Ambulation   Ambulation assist              Walk 10 feet activity   Assist           Walk 50 feet activity   Assist           Walk 150 feet activity   Assist           Walk 10 feet on uneven surface  activity   Assist            Wheelchair     Assist               Wheelchair 50 feet with 2 turns activity    Assist            Wheelchair 150 feet activity     Assist          Blood pressure 136/77, pulse 85, temperature 97.7 F (36.5 C), resp. rate 17, height 5' 4 (1.626 m), weight 78.9 kg, SpO2 96%.  Medical Problem List and Plan: 1. Functional deficits secondary to Left basal ganglia infarct              -patient may  shower             -ELOS/Goals: 14-16d 2.  Antithrombotics: -DVT/anticoagulation:  Pharmaceutical: Lovenox              -antiplatelet therapy: ASA+ Plavix  x 21d then plavix  alone 3. Pain Management: TYlenol , on chronic robaxin prn at home , hx lumbar surgery per Dr Gaither  Right sided flank pain no symptoms of UTI, will will treat locally and continue to monitor 4. Mood/Behavior/Sleep: consider melatonin if needed             -antipsychotic agents: NA Anxiety on prn atarax 5. Neuropsych/cognition: This patient is  capable of making decisions on her  own behalf. 6. Skin/Wound Care: Routine pressure relief measures 7. Fluids/Electrolytes/Nutrition: Monitor with serial checks.  8.  HTN: Monitor BP TID. Long term BP goal 130-150 range.              --amlodipine  10 mg/day and Hydralazine 50 mg qid.  Vitals:   08/30/24 1922 08/31/24 0307  BP: 121/65 136/77  Pulse: 96 85  Resp: 17 17  Temp: 98.1 F (36.7 C) 97.7 F (36.5 C)  SpO2: 100% 96%    9. Hyperlipidemia: LDL 118 and now on Lipitor 80 mg daily 10. H/o COPD: Has had cardiac/pulmonary work up for SOB/DOE.  --Has smoked for 40+ years. On Breo.  11. H/o cocaine use: Continue to educate on importance of cessation.  12. CKD III A: BUN/SCr 26/1.64 @ admission and 1.3-1.5 in the past year per chart review.     Latest Ref Rng & Units 08/31/2024    4:48 AM 08/27/2024    2:28 AM 08/26/2024   12:22 AM  BMP  Glucose 70 - 99 mg/dL 93  94    BUN 8 - 23 mg/dL 27  19    Creatinine 9.55 - 1.00 mg/dL 8.28  8.94  8.62    Sodium 135 - 145 mmol/L 139  138    Potassium 3.5 - 5.1 mmol/L 3.6  4.0    Chloride 98 - 111 mmol/L 110  109    CO2 22 - 32 mmol/L 20  17    Calcium  8.9 - 10.3 mg/dL 8.9  9.1    BUN/creatinine elevated, will encourage fluids  13. Gout: On allopurinol .  14. H/o GIB/PUD:  Monitor H/H due to DAPT on board. On PPI.  15. Thrush: Treated with mycelex torches.  16. Prominent adenoids: Follow up with ENT for evaluation after discharge    LOS: 1 days A FACE TO FACE EVALUATION WAS PERFORMED  Deborah Mcmahon 08/31/2024, 8:12 AM

## 2024-08-31 NOTE — Progress Notes (Signed)
 Inpatient Rehabilitation Care Coordinator Assessment and Plan Patient Details  Name: Deborah Mcmahon MRN: [id number] Date of Birth: December 13, 1960  Today's Date: 08/31/2024  Hospital Problems: Principal Problem:   Acute stroke of basal ganglia Deborah Mcmahon)  Past Medical History:  Past Medical History:  Diagnosis Date   Carpal tunnel syndrome    bilateral   Chronic back pain    spondylolisthesis   Constipation    takes an OTC stool softener daily as needed   Gallstones    History of migraine    Hypertension    Nausea and vomiting 01/20/2024   Nocturia    Weakness    tingling    Past Surgical History:  Past Surgical History:  Procedure Laterality Date   BACK SURGERY     fusion   BIOPSY  12/31/2020   Procedure: BIOPSY;  Surgeon: Wilhelmenia Aloha Raddle., MD;  Location: [facility] ENDOSCOPY;  Service: Gastroenterology;;   CERVICAL SPINE SURGERY     x 4-fusion   COLONOSCOPY WITH PROPOFOL  N/A 12/31/2020   Procedure: COLONOSCOPY WITH PROPOFOL ;  Surgeon: Wilhelmenia Aloha Raddle., MD;  Location: [facility] ENDOSCOPY;  Service: Gastroenterology;  Laterality: N/A;   ESOPHAGOGASTRODUODENOSCOPY (EGD) WITH PROPOFOL  N/A 12/31/2020   Procedure: ESOPHAGOGASTRODUODENOSCOPY (EGD) WITH PROPOFOL ;  Surgeon: Wilhelmenia Aloha Raddle., MD;  Location: [facility] ENDOSCOPY;  Service: Gastroenterology;  Laterality: N/A;   MYRINGOTOMY     POLYPECTOMY  12/31/2020   Procedure: POLYPECTOMY;  Surgeon: Mansouraty, Aloha Raddle., MD;  Location: [facility] ENDOSCOPY;  Service: Gastroenterology;;   right knee surgery     screws   TONSILLECTOMY AND ADENOIDECTOMY     TUBAL LIGATION     Social History:  reports that she has been smoking cigarettes. She has a 20.5 pack-year smoking history. She has never used smokeless tobacco. She reports that she does not drink alcohol  and does not use drugs.  Family / Support Systems Marital Status: Single Patient Roles: Partner Spouse/Significant Other: Deborah Mcmahon [phone] Other Supports: Deborah Mcmahon  [phone] Anticipated Caregiver: Deborah Mcmahon will be there when Deborah is working Ability/Limitations of Caregiver: Deborah Mcmahon will be there while Deborah works he is self employed and works long hours Medical laboratory scientific officer: 24/7 Family Dynamics: Close knit with family who will make sure her needs are provided for. She has friends who are supportive and will check on them and make sure her needs are met  Social History Preferred language: English Religion: Baptist Cultural Background: NA Education: HS Health Literacy - How often do you need to have someone help you when you read instructions, pamphlets, or other written material from your doctor or pharmacy?: Never Writes: Yes Employment Status: Disabled Marine scientist Issues: NA Guardian/Conservator: None-according to MD pt is capable of making her own decisions while here   Abuse/Neglect Abuse/Neglect Assessment Can Be Completed: Yes Physical Abuse: Denies Verbal Abuse: Denies Sexual Abuse: Denies Exploitation of patient/patient's resources: Denies Self-Neglect: Denies  Patient response to: Social Isolation - How often do you feel lonely or isolated from those around you?: Rarely  Emotional Status Pt's affect, behavior and adjustment status: Pt is motivated to get back to her independent level, she has had a stroke oin the past and recovered and hopes to do this again. She wil have assist but does not want to burden them. She will do what the therapists ask of her Recent Psychosocial Issues: other health issues Psychiatric History: No history seems to be coping appropriately but will ask neuro-psych to see for substance issues and adjustment Substance Abuse History: Pt denies drug issue and feels  it woas not correct. She was positive on her drug screen for cocaine.  Patient / Family Perceptions, Expectations & Goals Pt/Family understanding of illness & functional limitations: Pt is able to explain her stroke and deficits she  hopes to recover like she has done in the past after that stroke. She does talk with the MD and feels understands her plan moving forward Premorbid pt/family roles/activities: fiance, aunt, friend, retiree, etc Anticipated changes in roles/activities/participation: resume Pt/family expectations/goals: Pt states:  I want to go home soon but want to be able to do for myself.  Community [employer] Agencies: None Premorbid Home Care/DME Agencies: Other (Comment) (rw, cane tub seat) Transportation available at discharge: Deborah Is the patient able to respond to transportation needs?: Yes In the past 12 months, has lack of transportation kept you from medical appointments or from getting medications?: No In the past 12 months, has lack of transportation kept you from meetings, work, or from getting things needed for daily living?: No Resource referrals recommended: Neuropsychology  Discharge Planning Living Arrangements: Spouse/significant other Support Systems: Spouse/significant other, Other relatives, Friends/neighbors Type of Residence: Private residence Insurance Resources: Media planner (specify) ([employer] Medicare dual complete) Financial Resources: SSD, Family Support Financial Screen Referred: No Living Expenses: Psychologist, sport and exercise Management: Patient, Significant Other Does the patient have any problems obtaining your medications?: No Home Management: self Patient/Family Preliminary Plans: Return home with Deborah who does work and is self employed so he works loing hours at time. Her neice-Deborah Mcmahon will be ther when Deborah is working so she will have 24/7 care. She is aware of being evaluated and goals being set for stay here. Will update once team conference tomorrow Care Coordinator Barriers to Discharge: Insurance for SNF coverage, Medication compliance Care Coordinator Anticipated Follow Up Needs: HH/OP  Clinical Impression Pleasant female who is motivated to recover but does need  to make some adjustments in her life to prevent another stroke. Have placed on neuro-psych list and will update after team conference tomorrow.  Deborah Mcmahon Deborah Mcmahon 08/31/2024, 9:41 AM

## 2024-08-31 NOTE — Plan of Care (Signed)
  Problem: RH Swallowing Goal: LTG Patient will consume least restrictive diet using compensatory strategies with assistance (SLP) Description: LTG:  Patient will consume least restrictive diet using compensatory strategies with assistance (SLP) Flowsheets (Taken 08/31/2024 1723) LTG: Pt Patient will consume least restrictive diet using compensatory strategies with assistance of (SLP): Modified Independent   Problem: RH Expression Communication Goal: LTG Patient will increase speech intelligibility (SLP) Description: LTG: Patient will increase speech intelligibility at word/phrase/conversation level with cues, % of the time (SLP) Flowsheets (Taken 08/31/2024 1723) LTG: Patient will increase speech intelligibility (SLP): Supervision Level: (sentence) Other (Comment) Percent of time patient will use intelligible speech: 85   Problem: RH Problem Solving Goal: LTG Patient will demonstrate problem solving for (SLP) Description: LTG:  Patient will demonstrate problem solving for basic/complex daily situations with cues  (SLP) Flowsheets (Taken 08/31/2024 1723) LTG: Patient will demonstrate problem solving for (SLP): Basic daily situations LTG Patient will demonstrate problem solving for: Supervision   Problem: RH Awareness Goal: LTG: Patient will demonstrate awareness during functional activites type of (SLP) Description: LTG: Patient will demonstrate awareness during functional activites type of (SLP) Flowsheets (Taken 08/31/2024 1723) Patient will demonstrate during cognitive/linguistic activities awareness type of: Emergent LTG: Patient will demonstrate awareness during cognitive/linguistic activities with assistance of (SLP): Supervision

## 2024-08-31 NOTE — Progress Notes (Signed)
 Orthopedic Tech Progress Note Patient Details:  Deborah Mcmahon Aug 14, 1961 [id number]  Called in order to [employer] for a PRAFO BOOT as well as a RESTING HAND SPLINT RIGHT  Patient ID: Deborah Mcmahon, female   DOB: 07/20/1961, 63 y.o.   MRN: [id number]  Deborah Mcmahon Pac 08/31/2024, 12:18 PM

## 2024-09-01 NOTE — Progress Notes (Signed)
 Physical Therapy Discharge Note  Patient Details  Name: Deborah Mcmahon MRN: [id number] Date of Birth: March 23, 1961  Today's Date: 09/01/2024   Skilled Therapeutic Interventions/Progress Updates:   This patient was unable to complete the inpatient rehab program due to leaving unit AMA; therefore did not meet their long term goals. Pt left the program at a Mod/ Max assist level for their functional mobility/ transfers in a safe manner. This patient is being discharged from PT services at this time.  Pt's perception of pain in the last five days was unable to answer at this time.    See CareTool for functional status details  If the patient is able to return to inpatient rehabilitation within 3 midnights, this may be considered an interrupted stay and therapy services will resume as ordered. Modification and reinstatement of their goals will be made upon completion of therapy service reevaluations.    Therapy Documentation Precautions:  Precautions Precautions: Fall, Other (comment) (increased flexor tone in R hemibody) Recall of Precautions/Restrictions: Intact Precaution/Restrictions Comments: R hemipareisis Restrictions Weight Bearing Restrictions Per Provider Order: No   Therapy/Group: Individual Therapy  Mliss DELENA Milliner PT, DPT, CSRS 09/01/2024, 10:14 AM

## 2024-09-01 NOTE — Evaluation (Signed)
 Physical Therapy Assessment and Plan  Patient Details  Name: Deborah Mcmahon MRN: [id number] Date of Birth: [birth year]  PT Diagnosis: Difficulty walking, Hemiparesis dominant, Hypertonia, Impaired sensation, Low back pain, and Muscle weakness Rehab Potential: Good ELOS: 2-3 weeks   Today's Date: 08/31/2024 PT Individual Time: 1302-1405 PT Individual Time Calculation: 63 min  Hospital Problem: Principal Problem:   Acute stroke of basal ganglia (HCC)   Past Medical History:  Past Medical History:  Diagnosis Date   Carpal tunnel syndrome    bilateral   Chronic back pain    spondylolisthesis   Constipation    takes an OTC stool softener daily as needed   Gallstones    History of migraine    Hypertension    Nausea and vomiting 01/20/2024   Nocturia    Weakness    tingling    Past Surgical History:  Past Surgical History:  Procedure Laterality Date   BACK SURGERY     fusion   BIOPSY  12/31/2020   Procedure: BIOPSY;  Surgeon: Wilhelmenia Aloha Raddle., MD;  Location: [facility] ENDOSCOPY;  Service: Gastroenterology;;   CERVICAL SPINE SURGERY     x 4-fusion   COLONOSCOPY WITH PROPOFOL  N/A 12/31/2020   Procedure: COLONOSCOPY WITH PROPOFOL ;  Surgeon: Wilhelmenia Aloha Raddle., MD;  Location: [facility] ENDOSCOPY;  Service: Gastroenterology;  Laterality: N/A;   ESOPHAGOGASTRODUODENOSCOPY (EGD) WITH PROPOFOL  N/A 12/31/2020   Procedure: ESOPHAGOGASTRODUODENOSCOPY (EGD) WITH PROPOFOL ;  Surgeon: Wilhelmenia Aloha Raddle., MD;  Location: [facility] ENDOSCOPY;  Service: Gastroenterology;  Laterality: N/A;   MYRINGOTOMY     POLYPECTOMY  12/31/2020   Procedure: POLYPECTOMY;  Surgeon: Mansouraty, Aloha Raddle., MD;  Location: [facility] ENDOSCOPY;  Service: Gastroenterology;;   right knee surgery     screws   TONSILLECTOMY AND ADENOIDECTOMY     TUBAL LIGATION      Assessment & Plan Clinical Impression: Patient is a 63 y.o. female with history of HTN, chronic LBP, migraines, cerebellar CVA, 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, who was admitted on  08/25/24 with slurred speech, facial droop and right sided weakness.  Patient reported waking up with symptoms early arm, BP 226/122 at admission and was outside window for tPA. CTA head showed stable cerebellar stroke. MRI brain showed left basal ganglia infarct with periventicular white matter ischemic infarction. Neurology questioned stroke secondary to chronic hypertensive angiopathy v/s cardioembolic v/s atheroembolic cause but  UDS + cocaine and also question of vasospasm. 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    She received IVF and and BP meds held to allow for adequate perfusion. Dr. Jerri felt that stroke likely due to small vessel disease and recommended DAPT X 3 weeks followed by Plavix  alone (as noncompliant with plavix  PTA) Long term BP goal 130-`50 given severe bilateral artery stenosis.  PT/OT consulted and working on pregait activity due to severe right sided weakness and increased tone noted on right. She requires +2 mod assist with equipment for tranfers and mod to max assist with ADLs. Also noted to have difficulty following multi-step commands. Patient transferred to CIR on 08/30/2024 .   Patient currently requires max assist with mobility secondary to muscle weakness, decreased cardiorespiratoy endurance, impaired timing and sequencing, abnormal tone, unbalanced muscle activation, and decreased coordination, decreased attention to right and decreased motor planning, decreased attention, decreased awareness, decreased problem solving, and decreased safety  awareness, and decreased sitting balance, decreased standing balance, hemiplegia, and decreased balance strategies.  Prior to hospitalization, patient was independent  with mobility and lived with  Significant other, Friend(s) in a House home.  Home access is 3Stairs to enter.  Patient will benefit from skilled PT intervention to maximize safe functional mobility, minimize fall risk, and decrease caregiver burden for planned discharge home with 24 hour assist.  Anticipate patient will benefit from follow up [facility] at discharge.  PT - End of Session Activity Tolerance: Tolerates 30+ min activity with multiple rests Endurance Deficit: Yes Endurance Deficit Description: Frequent rest breaks provided during eval PT Assessment Rehab Potential (ACUTE/IP ONLY): Good PT Barriers to Discharge: Inaccessible home environment;Decreased caregiver support;Home environment access/layout;Insurance for SNF coverage PT Patient demonstrates impairments in the following area(s): Balance;Behavior;Endurance;Motor;Pain;Perception;Safety;Sensory PT Transfers Functional Problem(s): Bed Mobility;Bed to Chair;Car;Furniture PT Locomotion Functional Problem(s): Ambulation;Stairs PT Plan PT Intensity: Minimum of 1-2 x/day ,45 to 90 minutes PT Frequency: 5 out of 7 days PT Duration Estimated Length of Stay: 2-3 weeks PT Treatment/Interventions: Ambulation/gait training;Cognitive remediation/compensation;Discharge planning;DME/adaptive equipment instruction;Functional mobility training;Pain management;Psychosocial support;Splinting/orthotics;Therapeutic Activities;UE/LE Strength taining/ROM;Balance/vestibular training;Community reintegration;Disease management/prevention;Functional electrical stimulation;Neuromuscular re-education;Patient/family education;Skin care/wound management;Stair training;Therapeutic Exercise;UE/LE Coordination activities;Wheelchair propulsion/positioning PT Transfers Anticipated Outcome(s): supervision PT Locomotion Anticipated Outcome(s): supervision/ CGA PT Recommendation Recommendations for Other Services: Therapeutic Recreation consult Therapeutic Recreation Interventions: Stress  management;Kitchen group Follow Up Recommendations: Home health PT;Outpatient PT;24 hour supervision/assistance Patient destination: Home Equipment Recommended: To be determined   PT Evaluation Precautions/Restrictions Precautions Precautions: Fall;Other (comment) (increased flexor tone in R hemibody) Precaution/Restrictions Comments: R hemipareisis Restrictions Weight Bearing Restrictions Per Provider Order: No  Pain Pain Assessment Pain Scale: 0-10 Pain Score: 8  Pain Type: Chronic pain Pain Location: Back Pain Orientation: Lower Pain Descriptors / Indicators: Aching;Discomfort Pain Onset: On-going Patients Stated Pain Goal: 2 Pain Interference Pain Interference Pain Effect on Sleep: 1. Rarely or not at all Pain Interference with Therapy Activities: 1. Rarely or not at all Pain Interference with Day-to-Day Activities: 1. Rarely or not at all Home Living/Prior Functioning Home Living Available Help at Discharge: Family;Friend(s);Available 24 hours/day Type of Home: House Home Access: Stairs to enter [employer] of Steps: 3 Entrance Stairs-Rails: Right;Left;Can reach both Home Layout: One level Bathroom Shower/Tub: Tub/shower unit;Door;Curtain Firefighter: Standard Bathroom Accessibility: Yes Additional Comments: Pt lives with fiance who works full time  Lives With: Significant other;Friend(s) Prior Function Level of Independence: Independent with basic ADLs;Independent with homemaking with ambulation;Independent with gait;Independent with transfers  Able to Take Stairs?: Yes Driving: Yes Vocation: On disability Vision/Perception  Vision - History Ability to See in Adequate Light: 0 Adequate Vision - Assessment Eye Alignment: Within Functional Limits Ocular Range of Motion: Within Functional Limits Alignment/Gaze Preference: Within Defined Limits Perception Perception: Impaired Preception Impairment Details: Inattention/Neglect Perception-Other  Comments: Mild inattention to R LE noted when standing during ADLs; Pt attending well to R UE and motivated to utilize during functional activities Praxis Praxis: Impaired Praxis Impairment Details: Therapist, sports Overall Cognitive Status: Impaired/Different from baseline Arousal/Alertness: Awake/alert Orientation Level: Oriented X4 Attention: Sustained;Selective Sustained Attention: Appears intact Selective Attention: Impaired Memory: Appears intact Awareness: Impaired Problem Solving: Impaired Executive Function: Reasoning;Self Monitoring;Self Correcting Reasoning: Impaired Self Monitoring: Impaired Self Correcting: Impaired Behaviors: Impulsive Safety/Judgment: Impaired Sensation Sensation Light Touch: Impaired by gross assessment Proprioception: Impaired by gross assessment Coordination Gross Motor Movements are Fluid and Coordinated: No Fine Motor Movements are Fluid and Coordinated: No Coordination and Movement Description: Decreased coordination of R U/LE d/t hemiplegia Motor  Motor Motor: Hemiplegia;Abnormal postural alignment and control;Abnormal tone Motor - Skilled Clinical Observations: R hemiplegia with tone present in R UE, some extensor tone in RLE   Trunk/Postural Assessment  Cervical Assessment Cervical Assessment: Exceptions to [facility] (forward head) Thoracic Assessment Thoracic Assessment: Exceptions to [facility] (rounded shoulders) Lumbar Assessment Lumbar Assessment: Within Functional Limits Postural Control Postural Control: Deficits on evaluation Trunk Control: decreased Protective Responses: delayed  Balance Balance Balance Assessed: Yes Static Sitting Balance Static Sitting - Balance Support: Feet supported Static Sitting - Level of Assistance: 5: Stand by assistance (CGA) Dynamic Sitting Balance Dynamic Sitting - Balance Support: Feet supported Dynamic Sitting - Level of Assistance: 4: Min assist Static Standing Balance Static Standing  - Balance Support: During functional activity;Bilateral upper extremity supported Static Standing - Level of Assistance: 3: Mod assist Dynamic Standing Balance Dynamic Standing - Balance Support: During functional activity Dynamic Standing - Level of Assistance: 2: Max assist;3: Mod assist Extremity Assessment      RLE Assessment RLE Assessment: Exceptions to [facility] RLE Strength Right Hip Flexion: 3-/5 Right Hip Extension: 3-/5 Right Hip ABduction: 3-/5 Right Hip ADduction: 3-/5 Right Knee Flexion: 3-/5 Right Knee Extension: 3-/5 Right Ankle Dorsiflexion: 0/5 Right Ankle Plantar Flexion: 0/5    Care Tool Care Tool Bed Mobility Roll left and right activity   Roll left and right assist level: Moderate Assistance - Patient 50 - 74%    Sit to lying activity   Sit to lying assist level: Moderate Assistance - Patient 50 - 74%    Lying to sitting on side of bed activity   Lying to sitting on side of bed assist level: the ability to move from lying on the back to sitting on the side of the bed with no back support.: Maximal Assistance - Patient 25 - 49%     Care Tool Transfers Sit to stand transfer   Sit to stand assist level: Maximal Assistance - Patient 25 - 49%    Chair/bed transfer   Chair/bed transfer assist level: Maximal Assistance - Patient 25 - 49%    Car transfer   Car transfer assist level: Maximal Assistance - Patient 25 - 49%      Care Tool Locomotion Ambulation   Assist level: Moderate Assistance - Patient 50 - 74% Assistive device: Walker-rolling Max distance: 30 ft  Walk 10 feet activity   Assist level: Moderate Assistance - Patient - 50 - 74% Assistive device: Walker-rolling (R saddle splint)   Walk 50 feet with 2 turns activity Walk 50 feet with 2 turns activity did not occur: Safety/medical concerns      Walk 150 feet activity Walk 150 feet activity did not occur: Safety/medical concerns      Walk 10 feet on uneven surfaces activity Walk 10 feet on  uneven surfaces activity did not occur: Safety/medical concerns      Stairs   Assist level: Maximal Assistance - Patient 25 - 49% Stairs assistive device: 1 hand rail    Walk up/down 1 step activity   Walk up/down 1 step (curb) assist level: Maximal Assistance - Patient 25 - 49% Walk up/down 1 step or curb assistive device: 1 hand rail  Walk up/down 4 steps activity   Walk up/down 4 steps assist level: Moderate Assistance - Patient - 50 - 74% Walk up/down 4 steps assistive device: 1 hand rail  Walk up/down 12 steps activity   Walk up/down 12 steps assist level: Maximal Assistance - Patient 25 - 49%    Pick up  small objects from floor Pick up small object from the floor (from standing position) activity did not occur: Safety/medical concerns      Wheelchair Is the patient using a wheelchair?: Yes Type of Wheelchair: Manual   Wheelchair assist level: Dependent - Patient 0% Max wheelchair distance: 200 ft  Wheel 50 feet with 2 turns activity   Assist Level: Dependent - Patient 0%  Wheel 150 feet activity   Assist Level: Dependent - Patient 0%    Refer to Care Plan for Long Term Goals  SHORT TERM GOAL WEEK 1 PT Short Term Goal 1 (Week 1): Pt will perform bed mobility with overall MinA. PT Short Term Goal 2 (Week 1): Pt will perform standing transfers with increased awareness and overall CGA/ MinA. PT Short Term Goal 3 (Week 1): Pt will ambulate at least 75 ft using LRAD with overall MinA. PT Short Term Goal 4 (Week 1): Pt will navigate at least 8 steps using L HR only with MinA.  Recommendations for other services: Therapeutic Recreation  Kitchen group and Stress management  Skilled Therapeutic Intervention Mobility Bed Mobility Bed Mobility: Supine to Sit;Sit to Supine Supine to Sit: Moderate Assistance - Patient 50-74%;Maximal Assistance - Patient - Patient 25-49% Sit to Supine: Moderate Assistance - Patient 50-74%;Maximal Assistance - Patient 25-49% Transfers Transfers:  Sit to Stand;Stand to Sit;Stand Pivot Transfers Sit to Stand: Moderate Assistance - Patient 50-74%;Maximal Assistance - Patient 25-49% Stand to Sit: Moderate Assistance - Patient 50-74%;Maximal Assistance - Patient 25-49% Stand Pivot Transfers: Moderate Assistance - Patient 50 - 74%;Maximal Assistance - Patient 25 - 49% Stand Pivot Transfer Details: Verbal cues for technique;Verbal cues for gait pattern;Verbal cues for sequencing;Verbal cues for precautions/safety Transfer (Assistive device): Rolling walker Locomotion  Gait Ambulation: Yes Gait Assistance: Minimal Assistance - Patient > 75%;Moderate Assistance - Patient 50-74% Gait Distance (Feet): 30 Feet Assistive device: Rolling walker Gait Assistance Details: Verbal cues for technique;Verbal cues for precautions/safety;Verbal cues for safe use of DME/AE;Verbal cues for gait pattern Gait Gait: Yes Gait Pattern: Impaired Gait Pattern: Step-to pattern;Decreased stance time - right;Decreased step length - right;Decreased step length - left;Decreased dorsiflexion - right;Trendelenburg Gait velocity: decreased Stairs / Additional Locomotion Stairs: Yes Stairs Assistance: Moderate Assistance - Patient 50 - 74% Stair Management Technique: One rail Left;Step to pattern Number of Stairs: 4 Height of Stairs: 6  Skilled Intervention: PT Evaluation completed; see above for results. PT educated patient in roles of PT vs OT, PT POC, rehab potential, rehab goals, and discharge recommendations along with recommendation for follow-up rehabilitation services. Individual treatment initiated:  Patient seated upright in w/c upon PT arrival. Patient alert and agreeable to PT session. Step dtr and grandchildren leaving pt's room.   No pain complaint during session.  Therapeutic Activity: Bed Mobility: Patient performed supine <> sit with Mod/ MaxA for weakness in abdominal musculature and inability to assist self with RUE. Provided cues for technique  with noted improvement in level of assist, however is limited by general trunk weakness. Transfers: Patient performed sit <> stand and stand pivot transfers initially with MaxA and poor technique. Provided vc/ tc for sequencing (see NMR) and improved by end of session to Min/ ModA.  Gait Training:  Patient ambulated 30 ft using RW with ModA initially. Demonstrated difficulty with R advancement and R knee buckle. With vc/ tc for upright posture, focus to lift of R knee, then extensor hold during stance phase, pt is able to cover 30 ft with Min/ ModA overall.  Pt guided in  stair training with physical demonstration and verbal instructions provided prior to performance. Pt is able to complete four 6 steps using LHR with ModA to ascend leading with LLE. Requires assist in guard to R knee during stance and ModA to complete lift and placement to step. Continuous cues for technique throughout. Then ModA to descend leading with RLE. VC provided at initiation of each direction for leading LE.   Neuromuscular Re-ed: NMR facilitated during session with focus on standing balance, motor control. Pt guided in sit<>stand technique. With no intervention, pt noted to struggle and lose balance to R side when attempting to stand to RW. Unable to power up from seat. With verbal instructions and visual demonstration, then cued step by step for forward scoot, upright posture, hip hinge for anterior weight shift and pushing self forward with hands, pt requires guard to RLE and noted improvement in muscle fiber recruitment for glutes and quads for improved quality of movement and can rise to stand with ModA for powerup and MinA to complete. Pt surprised with ease of rising to stand. . NMR performed for improvements in motor control and coordination, balance, sequencing, judgement, and self confidence/ efficacy in performing all aspects of mobility at highest level of independence.   Patient seated upright in w/c  at end of  session with brakes locked, belt alarm set, and all needs within reach.    Discharge Criteria: Patient will be discharged from PT if patient refuses treatment 3 consecutive times without medical reason, if treatment goals not met, if there is a change in medical status, if patient makes no progress towards goals or if patient is discharged from hospital.  The above assessment, treatment plan, treatment alternatives and goals were discussed and mutually agreed upon: by patient  Mliss DELENA Milliner PT, DPT, CSRS 08/31/2024, 6:12 PM

## 2024-09-01 NOTE — Discharge Summary (Signed)
 Physician Discharge Summary  Patient ID: Deborah Mcmahon MRN: [id number] DOB/AGE: [birth year] 64 y.o.  Admit date: 08/30/2024 Discharge date: 08/31/2024  Discharge Diagnoses:  Principal Problem:   Acute stroke of basal ganglia (HCC) Active Problems:   Essential hypertension   Tobacco abuse   CKD (chronic kidney disease), stage III (HCC)   Substance use   Discharged Condition: stable  Significant Diagnostic Studies: N/A   Labs:  Basic Metabolic Panel: Recent Labs  Lab 08/25/24 1628 08/25/24 1640 08/26/24 0022 08/27/24 0228 08/31/24 0448  NA 139 141  --  138 139  K 3.8 3.8  --  4.0 3.6  CL 106 106  --  109 110  CO2 23  --   --  17* 20*  GLUCOSE 114* 115*  --  94 93  BUN 26* 28*  --  19 27*  CREATININE 1.64* 1.80* 1.37* 1.05* 1.71*  CALCIUM  9.0  --   --  9.1 8.9  MG  --   --   --  2.1  --   PHOS  --   --   --  3.7  --     CBC: Recent Labs  Lab 08/25/24 1628 08/25/24 1640 08/26/24 0022 08/27/24 0228 08/31/24 0448  WBC 5.5  --  6.9 6.7 6.0  NEUTROABS 2.7  --   --  4.6 2.9  HGB 13.6   < > 12.9 13.9 12.1  HCT 42.3   < > 40.2 43.2 36.7  MCV 89.8  --  88.9 87.1 86.8  PLT 221  --  217 236 224   < > = values in this interval not displayed.    CBG: Recent Labs  Lab 08/25/24 1631  GLUCAP 194*    Brief HPI:   Deborah Mcmahon is a 63 y.o. female with history of HTN, chronic LBP, cerebellar CVA, 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, polysubstance abuse who was admitted on 08/25/24 with slurred speech, facial droop and right sided weakness. UDS + for cocaine. MRI brain done revealing left basal ganglia infarct and CTA head/neck showed occlusion of mid/distal artery with reconstitution via PCA, moderate to severe stenosis proximal R-V1 segment, 55% stenosis of proximal Left cervical ICA and incidental findings of similar prominence of adenoids w/recs to consider direct visualization and emphysema. Dr. Jerri felt that stroke likely due to small vessel diseas and recommended DAPT X 3 weeks followed  by Plavix  alone as patient non-compliant with plavix  as well as long term BP goal 130-150 range give severe bilateral artery stenosis. PT/OT consulted and patient was requiring +2 mod assist with mobility and mod to max assist with ADLs.    Hospital Course: Deborah Mcmahon was admitted to rehab 08/30/2024 for inpatient therapies to consist of PT, ST and OT at least three hours five days a week. Past admission physiatrist, therapy team and rehab RN have worked together to provide customized collaborative inpatient rehab. She required max assist for transfers and mod assist to ambulate 30' with use of RW. She required max assist with ADL tasks. Cognitive evaluation revealed impulsivity with decrease in problem solving and speech was 60% intelligible due to moderate dysarthria. Her blood pressures were monitored on TID basis and noted to be reasonably controlled. Labs done in am showed acute on chronic renal failure and she was encouraged to increase fluid intake. She tolerate therapy without difficulty but later in evening reported that she was tired of being in hospital and could do therapy independently at home. Need for continued therapy due to current  deficits were discussed with patient and niece who was present. Patient was advised to stay overnight to think things over. Niece was going to be back in am to discuss patient needs with staff/MD as well as work on safe discharge. Later that evening patient left against medical advice.    Diet: heart Healthy.   Disposition:  Home against medical advice.    Discharge Instructions     Ambulatory referral to Neurology   Complete by: As directed    An appointment is requested in approximately: 6 weeks   Ambulatory referral to Physical Medicine Rehab   Complete by: As directed    Hospital follow up      Allergies as of 08/31/2024   No Known Allergies      Medication List     STOP taking these medications    feeding supplement Liqd        TAKE these medications    acetaminophen  325 MG tablet Commonly known as: TYLENOL  Take 2 tablets (650 mg total) by mouth every 4 (four) hours as needed for mild pain (pain score 1-3) or fever. What changed: reasons to take this   albuterol  108 (90 Base) MCG/ACT inhaler Commonly known as: VENTOLIN  HFA Inhale 2 puffs into the lungs every 6 (six) hours as needed for wheezing or shortness of breath.   allopurinol  100 MG tablet Commonly known as: ZYLOPRIM  Take 100 mg by mouth every other day.   amLODipine  10 MG tablet Commonly known as: NORVASC  Take 1 tablet (10 mg total) by mouth daily. What changed: when to take this   aspirin  EC 81 MG tablet Take 1 tablet (81 mg total) by mouth daily for 21 days. Swallow whole.   atorvastatin  80 MG tablet Commonly known as: LIPITOR Take 1 tablet (80 mg total) by mouth daily.   clopidogrel  75 MG tablet Commonly known as: PLAVIX  Take 75 mg by mouth every other day.   fluticasone  furoate-vilanterol 100-25 MCG/ACT Aepb Commonly known as: Breo Ellipta  Inhale 1 puff into the lungs daily.   hydrALAZINE 50 MG tablet Commonly known as: APRESOLINE Take 1 tablet (50 mg total) by mouth every 6 (six) hours.   loratadine 10 MG tablet Commonly known as: CLARITIN Take 1 tablet (10 mg total) by mouth daily as needed for allergies.   pantoprazole  40 MG tablet Commonly known as: PROTONIX  Take 1 tablet (40 mg total) by mouth daily.        Follow-up Information     Leontine Cramp, NP Follow up.   Specialty: Nurse Practitioner Why: Call in 1-2 days for post hospital follow up Contact information: [address] [place] [address] [phone]         Carilyn Prentice BRAVO, MD Follow up.   Specialty: Physical Medicine and Rehabilitation Why: office will call you with follow up appointment Contact information: [address] [place] [address] [phone]         [facility] Follow up.   Why: office  will call you with follow up appointment Contact information: [address]     [address] [place]  [id number] [phone]                Signed: Sharlet GORMAN Schmitz 09/01/2024, 11:23 AM

## 2024-09-01 NOTE — Progress Notes (Signed)
 Inpatient Rehabilitation Care Coordinator Discharge Note   Patient Details  Name: Deborah Mcmahon MRN: [id number] Date of Birth: 01/26/1961   Discharge location: [place] with fiance -[place]  Length of Stay: 1 day  Discharge activity level: min/mod level  Home/community participation: active  Patient response un:Yzjouy Literacy - How often do you need to have someone help you when you read instructions, pamphlets, or other written material from your doctor or pharmacy?: Patient unable to respond  Patient response un:Dnrpjo Isolation - How often do you feel lonely or isolated from those around you?: Patient unable to respond  Services provided included: MD, RD, PT, OT, SLP, RN, CM, Pharmacy, SW  Financial Services:  Financial Services Utilized: Production designer, theatre[employer]television[employer]film medicare dual complete  Choices offered to/list presented to: na  Follow-up services arranged:      Left AMA on day one of CIR stay        Patient response to transportation need: Is the patient able to respond to transportation needs?: Yes In the past 12 months, has lack of transportation kept you from medical appointments or from getting medications?: No In the past 12 months, has lack of transportation kept you from meetings, work, or from getting things needed for daily living?: No   Patient/Family verbalized understanding of follow-up arrangements:  Yes  Individual responsible for coordination of the follow-up plan: self  Confirmed correct DME delivered: Deborah Mcmahon 09/01/2024    Comments (or additional information):pt insisted on leaving and signed out AMA. Went home with fiance. Will require 24/7 care at home and likely will be re-admitted to the hospital. Does have substance abuse issues    Deborah Mcmahon, Deborah Mcmahon

## 2024-09-01 NOTE — Plan of Care (Signed)
 Patient left AMA and unable to complete CIR program

## 2024-09-01 NOTE — Plan of Care (Signed)
  Problem: RH Balance Goal: LTG Patient will maintain dynamic sitting balance (PT) Description: LTG:  Patient will maintain dynamic sitting balance with assistance during mobility activities (PT) Flowsheets (Taken 08/31/2024 1720) LTG: Pt will maintain dynamic sitting balance during mobility activities with:: Independent with assistive device  Goal: LTG Patient will maintain dynamic standing balance (PT) Description: LTG:  Patient will maintain dynamic standing balance with assistance during mobility activities (PT) Flowsheets (Taken 08/31/2024 1720) LTG: Pt will maintain dynamic standing balance during mobility activities with:: Supervision/Verbal cueing   Problem: Sit to Stand Goal: LTG:  Patient will perform sit to stand with assistance level (PT) Description: LTG:  Patient will perform sit to stand with assistance level (PT) Flowsheets (Taken 08/31/2024 1720) LTG: PT will perform sit to stand in preparation for functional mobility with assistance level: Supervision/Verbal cueing   Problem: RH Bed Mobility Goal: LTG Patient will perform bed mobility with assist (PT) Description: LTG: Patient will perform bed mobility with assistance, with/without cues (PT). Flowsheets (Taken 08/31/2024 1720) LTG: Pt will perform bed mobility with assistance level of: Supervision/Verbal cueing   Problem: RH Bed to Chair Transfers Goal: LTG Patient will perform bed/chair transfers w/assist (PT) Description: LTG: Patient will perform bed to chair transfers with assistance (PT). Flowsheets (Taken 08/31/2024 1720) LTG: Pt will perform Bed to Chair Transfers with assistance level: Supervision/Verbal cueing   Problem: RH Car Transfers Goal: LTG Patient will perform car transfers with assist (PT) Description: LTG: Patient will perform car transfers with assistance (PT). Flowsheets (Taken 08/31/2024 1720) LTG: Pt will perform car transfers with assist:: Contact Guard/Touching assist   Problem: RH Furniture  Transfers Goal: LTG Patient will perform furniture transfers w/assist (OT/PT) Description: LTG: Patient will perform furniture transfers  with assistance (OT/PT). Flowsheets (Taken 08/31/2024 1720) LTG: Pt will perform furniture transfers with assist:: Supervision/Verbal cueing   Problem: RH Ambulation Goal: LTG Patient will ambulate in controlled environment (PT) Description: LTG: Patient will ambulate in a controlled environment, # of feet with assistance (PT). Flowsheets (Taken 08/31/2024 1720) LTG: Pt will ambulate in controlled environ  assist needed:: Supervision/Verbal cueing LTG: Ambulation distance in controlled environment: at least 150 ft using LRAD Goal: LTG Patient will ambulate in home environment (PT) Description: LTG: Patient will ambulate in home environment, # of feet with assistance (PT). Flowsheets (Taken 08/31/2024 1720) LTG: Pt will ambulate in home environ  assist needed:: Contact Guard/Touching assist LTG: Ambulation distance in home environment: up to 50 ft using LRAD   Problem: RH Stairs Goal: LTG Patient will ambulate up and down stairs w/assist (PT) Description: LTG: Patient will ambulate up and down # of stairs with assistance (PT) Flowsheets (Taken 08/31/2024 1720) LTG: Pt will ambulate up/down stairs assist needed:: Contact Guard/Touching assist LTG: Pt will  ambulate up and down number of stairs: at least 3 steps using LRAD

## 2024-10-07 ENCOUNTER — Encounter: Attending: Physical Medicine & Rehabilitation | Admitting: Physical Medicine & Rehabilitation

## 2024-10-27 NOTE — Progress Notes (Signed)
 [facility] Worker Note Stroke Post Discharge Follow-Up  Deborah Mcmahon [id number]   Contact Type:  Telephone/ Letter, unable to contact pt Encounter Date: 10/25/2024  Outreach Project:  Stroke post discharge follow-up Managed Medicaid Plan Participant:   PCP: Yes - See Care Teams in patient chart Payor Status: Does the patient have health insurance (Y/N): Yes Payor Name: : [facility] Medicare    Community Health Worker Documentation     Row Name 10/25/24 1206     Post-Stroke CHW Follow-Up Telephone Call   Discharge Date 08/30/24   Discharge Location [facility]   How have you been feeling since being released from the hospital? --  unable to contact pt   Program RN notified of new or worsening symptoms N/A   Depression Screening Exception: --  Unable to contact pt     Functional Questionnaire - ADL's   Bathing Dependent  Per OT notes in chart review   Dressing Dependent   Meal Prep Dependent   Eating Independent   Maintaining Continence Independent   Ambulation/Transferring Dependent   Does the patient have support for these ADL's --  Unknown, per chart review     Post-Discharge Instructions Reviewed   Did the patient receive and understand the discharge instructions provided? --  unknown per chart review, unable to contact pt   Did the patient obtain their post-discharge medications? --  Unknown, no med refills can be seen in EPIC     Follow-Up Appointments Review   PCP Hospital F/U appointment scheduled? Yes   Did the patient keep PCP discharge appointment? Yes  Pt was seen on 09/17/24   Specialist Hospital F/U appointment scheduled? Yes   Did the patient keep the specialist appointment --  per chart review pt was sent to CIR and left AMA after about 24hours     Risk Factor Follow-Up   Is the pateint diabetic? No   Does the patient have hypertension? Yes  per chart review   Does the patient have the medications/tools needed to manage their hypertension? --   Unknown, unable to contact pt   Does patient smoke, use tobacco product, and/or vape?  Yes  per chart review   Does the patient want information about smoking and/or tobacco use/vaping cessation? --  unknown, unable to contact pt   Does the patient have nutritional needs/restrictions? Yes   Does the patient have nutritional needs/restrictions? per chart review, pt should follow a eart healthy diet       Past Medical History:  Diagnosis Date   Carpal tunnel syndrome    bilateral   Chronic back pain    spondylolisthesis   Constipation    takes an OTC stool softener daily as needed   Gallstones    History of migraine    Hypertension    Nausea and vomiting 01/20/2024   Nocturia    Weakness    tingling    Social History   Substance and Sexual Activity  Alcohol  Use No   Social History   Substance and Sexual Activity  Drug Use No   Social History   Tobacco Use  Smoking Status Every Day   Current packs/day: 0.50   Average packs/day: 0.5 packs/day for 41.0 years (20.5 ttl pk-yrs)   Types: Cigarettes  Smokeless Tobacco Never  Tobacco Comments   6 - 7 cigarettes smoked daily ARJ 02/21/22    SDOH Screenings   Food Insecurity: Food Insecurity Present (08/26/2024)  Housing: High Risk (08/26/2024)  Transportation Needs: No Transportation  Needs (08/26/2024)  Utilities: At Risk (08/26/2024)  Financial Resource Strain: Not on File (05/17/2022)   Received from [facility]  Physical Activity: Not on File (05/17/2022)   Received from [facility]  Social Connections: Not on File (08/09/2023)   Received from [facility]  Stress: Not on File (05/17/2022)   Received from [facility]  Tobacco Use: High Risk (08/30/2024)   Referrals (if applicable):        Education:    Limiting Factors:  Unable to contact pt via phone   Follow-up:  Initial follow up, per [employer] protocol future follow up to be scheduled   Follow-up Type:  Telephone, chart review, and letter sent to pt     Cherise LOISE Finder,  NT

## 2024-11-03 ENCOUNTER — Other Ambulatory Visit: Payer: Self-pay | Admitting: Registered Nurse

## 2024-11-03 DIAGNOSIS — Z1231 Encounter for screening mammogram for malignant neoplasm of breast: Secondary | ICD-10-CM

## 2024-12-28 NOTE — Progress Notes (Signed)
 [facility] Worker Note  Deborah Mcmahon [id number]   Contact Type:    Encounter Date: 12/28/2024  Outreach Project:    Managed Medicaid Plan Participant:   PCP:   Payor Status:    Past Medical History:  Diagnosis Date   Carpal tunnel syndrome    bilateral   Chronic back pain    spondylolisthesis   Constipation    takes an OTC stool softener daily as needed   Gallstones    History of migraine    Hypertension    Nausea and vomiting 01/20/2024   Nocturia    Weakness    tingling    Social History   Substance and Sexual Activity  Alcohol  Use No   Social History   Substance and Sexual Activity  Drug Use No   Tobacco Use History[1]  SDOH Screenings   Food Insecurity: Food Insecurity Present (08/26/2024)  Housing: High Risk (08/26/2024)  Transportation Needs: No Transportation Needs (08/26/2024)  Utilities: At Risk (08/26/2024)  Financial Resource Strain: Not on File (05/17/2022)   Received from [facility]  Physical Activity: Not on File (05/17/2022)   Received from [facility]  Social Connections: Not on File (08/09/2023)   Received from [facility]  Stress: Not on File (05/17/2022)   Received from [facility]  Tobacco Use: High Risk (08/30/2024)   Referrals (if applicable):        Education:    Limiting Factors:     Follow-up:     Follow-up Type:      Cherise LOISE Finder, NT     [facility] Worker Note Stroke Post Discharge Follow-Up  Deborah Mcmahon [id number]   Contact Type:    Encounter Date: 12/28/2024  Outreach Project:    Managed Medicaid Plan Participant:   PCP:   Payor Status:        Past Medical History:  Diagnosis Date   Carpal tunnel syndrome    bilateral   Chronic back pain    spondylolisthesis   Constipation    takes an OTC stool softener daily as needed   Gallstones    History of migraine    Hypertension    Nausea and vomiting 01/20/2024   Nocturia    Weakness    tingling    Social History   Substance and Sexual Activity   Alcohol  Use No   Social History   Substance and Sexual Activity  Drug Use No   Tobacco Use History[2]  SDOH Screenings   Food Insecurity: Food Insecurity Present (08/26/2024)  Housing: High Risk (08/26/2024)  Transportation Needs: No Transportation Needs (08/26/2024)  Utilities: At Risk (08/26/2024)  Financial Resource Strain: Not on File (05/17/2022)   Received from [facility]  Physical Activity: Not on File (05/17/2022)   Received from [facility]  Social Connections: Not on File (08/09/2023)   Received from [facility]  Stress: Not on File (05/17/2022)   Received [facility]  Tobacco Use: High Risk (08/30/2024)   Referrals (if applicable):        Education:    Limiting Factors:     Follow-up:     Follow-up Type:      Cherise LOISE Finder, NT         [1]  Social History Tobacco Use  Smoking Status Every Day   Current packs/day: 0.50   Average packs/day: 0.5 packs/day for 41.0 years (20.5 ttl pk-yrs)   Types: Cigarettes  Smokeless Tobacco Never  Tobacco Comments   6 - 7 cigarettes smoked daily ARJ 02/21/22  [2]  Social History Tobacco  Use  Smoking Status Every Day   Current packs/day: 0.50   Average packs/day: 0.5 packs/day for 41.0 years (20.5 ttl pk-yrs)   Types: Cigarettes  Smokeless Tobacco Never  Tobacco Comments   6 - 7 cigarettes smoked daily ARJ 02/21/22
